# Patient Record
Sex: Female | Born: 1965 | Race: White | Hispanic: No | Marital: Married | Smoking: Never smoker
Health system: Southern US, Community
[De-identification: ages and names within clinical notes are randomized; demographics above are authoritative.]

## PROBLEM LIST (undated history)

## (undated) DIAGNOSIS — U071 COVID-19: Secondary | ICD-10-CM

## (undated) DIAGNOSIS — T4145XA Adverse effect of unspecified anesthetic, initial encounter: Secondary | ICD-10-CM

## (undated) DIAGNOSIS — T8859XA Other complications of anesthesia, initial encounter: Secondary | ICD-10-CM

## (undated) DIAGNOSIS — J1282 Pneumonia due to coronavirus disease 2019: Secondary | ICD-10-CM

## (undated) DIAGNOSIS — G43909 Migraine, unspecified, not intractable, without status migrainosus: Secondary | ICD-10-CM

## (undated) DIAGNOSIS — J45909 Unspecified asthma, uncomplicated: Secondary | ICD-10-CM

## (undated) DIAGNOSIS — N393 Stress incontinence (female) (male): Secondary | ICD-10-CM

## (undated) DIAGNOSIS — E119 Type 2 diabetes mellitus without complications: Secondary | ICD-10-CM

## (undated) DIAGNOSIS — D219 Benign neoplasm of connective and other soft tissue, unspecified: Secondary | ICD-10-CM

## (undated) DIAGNOSIS — Z87442 Personal history of urinary calculi: Secondary | ICD-10-CM

## (undated) DIAGNOSIS — R350 Frequency of micturition: Secondary | ICD-10-CM

## (undated) DIAGNOSIS — Z8489 Family history of other specified conditions: Secondary | ICD-10-CM

## (undated) DIAGNOSIS — R0600 Dyspnea, unspecified: Secondary | ICD-10-CM

## (undated) DIAGNOSIS — N200 Calculus of kidney: Secondary | ICD-10-CM

## (undated) HISTORY — PX: OTHER SURGICAL HISTORY: SHX169

## (undated) HISTORY — PX: EYE SURGERY: SHX253

## (undated) HISTORY — PX: KIDNEY STONE SURGERY: SHX686

## (undated) HISTORY — PX: WISDOM TOOTH EXTRACTION: SHX21

---

## 1998-08-04 ENCOUNTER — Inpatient Hospital Stay (HOSPITAL_COMMUNITY): Admission: AD | Admit: 1998-08-04 | Discharge: 1998-08-06 | Payer: Self-pay | Admitting: Obstetrics and Gynecology

## 1998-09-06 ENCOUNTER — Inpatient Hospital Stay (HOSPITAL_COMMUNITY): Admission: AD | Admit: 1998-09-06 | Discharge: 1998-09-08 | Payer: Self-pay | Admitting: Obstetrics and Gynecology

## 1998-10-04 ENCOUNTER — Other Ambulatory Visit: Admission: RE | Admit: 1998-10-04 | Discharge: 1998-10-04 | Payer: Self-pay | Admitting: Obstetrics and Gynecology

## 2000-09-06 ENCOUNTER — Other Ambulatory Visit: Admission: RE | Admit: 2000-09-06 | Discharge: 2000-09-06 | Payer: Self-pay | Admitting: Obstetrics and Gynecology

## 2001-03-31 ENCOUNTER — Inpatient Hospital Stay (HOSPITAL_COMMUNITY): Admission: AD | Admit: 2001-03-31 | Discharge: 2001-04-02 | Payer: Self-pay | Admitting: Obstetrics and Gynecology

## 2001-05-04 ENCOUNTER — Other Ambulatory Visit: Admission: RE | Admit: 2001-05-04 | Discharge: 2001-05-04 | Payer: Self-pay | Admitting: Obstetrics and Gynecology

## 2002-07-20 ENCOUNTER — Other Ambulatory Visit: Admission: RE | Admit: 2002-07-20 | Discharge: 2002-07-20 | Payer: Self-pay | Admitting: Obstetrics and Gynecology

## 2003-08-17 ENCOUNTER — Other Ambulatory Visit: Admission: RE | Admit: 2003-08-17 | Discharge: 2003-08-17 | Payer: Self-pay | Admitting: Obstetrics and Gynecology

## 2004-01-04 ENCOUNTER — Observation Stay (HOSPITAL_COMMUNITY): Admission: RE | Admit: 2004-01-04 | Discharge: 2004-01-04 | Payer: Self-pay | Admitting: Obstetrics and Gynecology

## 2004-01-04 HISTORY — PX: HYSTEROSCOPY WITH RESECTOSCOPE: SHX5395

## 2004-04-25 ENCOUNTER — Ambulatory Visit (HOSPITAL_COMMUNITY): Admission: RE | Admit: 2004-04-25 | Discharge: 2004-04-25 | Payer: Self-pay | Admitting: Obstetrics and Gynecology

## 2004-04-25 HISTORY — PX: ENDOMETRIAL ABLATION W/ NOVASURE: SUR434

## 2012-09-21 ENCOUNTER — Observation Stay (HOSPITAL_BASED_OUTPATIENT_CLINIC_OR_DEPARTMENT_OTHER)
Admission: EM | Admit: 2012-09-21 | Discharge: 2012-09-22 | Disposition: A | Discharge status: Home / Self Care | Payer: Managed Care, Other (non HMO) | Attending: Urology | Admitting: Urology

## 2012-09-21 ENCOUNTER — Emergency Department (HOSPITAL_BASED_OUTPATIENT_CLINIC_OR_DEPARTMENT_OTHER): Payer: Managed Care, Other (non HMO)

## 2012-09-21 ENCOUNTER — Encounter (HOSPITAL_BASED_OUTPATIENT_CLINIC_OR_DEPARTMENT_OTHER): Payer: Self-pay | Admitting: *Deleted

## 2012-09-21 DIAGNOSIS — N23 Unspecified renal colic: Secondary | ICD-10-CM

## 2012-09-21 DIAGNOSIS — Z23 Encounter for immunization: Secondary | ICD-10-CM | POA: Insufficient documentation

## 2012-09-21 DIAGNOSIS — N201 Calculus of ureter: Principal | ICD-10-CM | POA: Insufficient documentation

## 2012-09-21 HISTORY — DX: Benign neoplasm of connective and other soft tissue, unspecified: D21.9

## 2012-09-21 HISTORY — DX: Migraine, unspecified, not intractable, without status migrainosus: G43.909

## 2012-09-21 LAB — URINALYSIS, ROUTINE W REFLEX MICROSCOPIC
Glucose, UA: NEGATIVE mg/dL
Ketones, ur: NEGATIVE mg/dL
Nitrite: NEGATIVE
pH: 5 (ref 5.0–8.0)

## 2012-09-21 LAB — CBC WITH DIFFERENTIAL/PLATELET
Basophils Relative: 0 % (ref 0–1)
Eosinophils Absolute: 0.2 10*3/uL (ref 0.0–0.7)
Lymphs Abs: 2.3 10*3/uL (ref 0.7–4.0)
MCH: 29.8 pg (ref 26.0–34.0)
MCHC: 34 g/dL (ref 30.0–36.0)
Neutro Abs: 14.2 10*3/uL — ABNORMAL HIGH (ref 1.7–7.7)
Neutrophils Relative %: 80 % — ABNORMAL HIGH (ref 43–77)
Platelets: 277 10*3/uL (ref 150–400)
RBC: 4.53 MIL/uL (ref 3.87–5.11)

## 2012-09-21 LAB — PREGNANCY, URINE: Preg Test, Ur: NEGATIVE

## 2012-09-21 LAB — URINE MICROSCOPIC-ADD ON

## 2012-09-21 LAB — BASIC METABOLIC PANEL
Chloride: 102 mEq/L (ref 96–112)
GFR calc Af Amer: 56 mL/min — ABNORMAL LOW (ref >90)
GFR calc non Af Amer: 48 mL/min — ABNORMAL LOW (ref >90)
Potassium: 4.2 mEq/L (ref 3.5–5.1)
Sodium: 138 mEq/L (ref 135–145)

## 2012-09-21 MED ORDER — CIPROFLOXACIN IN D5W 400 MG/200ML IV SOLN
400.0000 mg | Freq: Two times a day (BID) | INTRAVENOUS | Status: DC
  Administered 2012-09-21 – 2012-09-22 (×2): 400 mg via INTRAVENOUS
  Filled 2012-09-21 (×3): qty 200

## 2012-09-21 MED ORDER — CEPHALEXIN 250 MG PO CAPS
500.0000 mg | ORAL_CAPSULE | Freq: Once | ORAL | Status: AC
  Administered 2012-09-21: 500 mg via ORAL
  Filled 2012-09-21: qty 2

## 2012-09-21 MED ORDER — ONDANSETRON HCL 4 MG/2ML IJ SOLN
4.0000 mg | Freq: Once | INTRAMUSCULAR | Status: AC
  Administered 2012-09-21: 4 mg via INTRAVENOUS
  Filled 2012-09-21: qty 2

## 2012-09-21 MED ORDER — ONDANSETRON 8 MG PO TBDP: 8.0000 mg | ORAL_TABLET | Freq: Three times a day (TID) | ORAL | Status: DC | PRN

## 2012-09-21 MED ORDER — PNEUMOCOCCAL VAC POLYVALENT 25 MCG/0.5ML IJ INJ
0.5000 mL | INJECTION | INTRAMUSCULAR | Status: AC
  Administered 2012-09-22: 0.5 mL via INTRAMUSCULAR
  Filled 2012-09-21: qty 0.5

## 2012-09-21 MED ORDER — INFLUENZA VIRUS VACC SPLIT PF IM SUSP
0.5000 mL | INTRAMUSCULAR | Status: AC
  Administered 2012-09-22: 0.5 mL via INTRAMUSCULAR
  Filled 2012-09-21: qty 0.5

## 2012-09-21 MED ORDER — HYDROMORPHONE HCL PF 1 MG/ML IJ SOLN
0.5000 mg | INTRAMUSCULAR | Status: DC | PRN
  Administered 2012-09-21 – 2012-09-22 (×6): 1 mg via INTRAVENOUS
  Filled 2012-09-21 (×6): qty 1

## 2012-09-21 MED ORDER — CEPHALEXIN 500 MG PO CAPS: 500.0000 mg | ORAL_CAPSULE | Freq: Four times a day (QID) | ORAL | Status: DC

## 2012-09-21 MED ORDER — ONDANSETRON 8 MG PO TBDP
8.0000 mg | ORAL_TABLET | Freq: Once | ORAL | Status: AC
  Administered 2012-09-21: 8 mg via ORAL
  Filled 2012-09-21: qty 1

## 2012-09-21 MED ORDER — ONDANSETRON HCL 4 MG/2ML IJ SOLN
4.0000 mg | INTRAMUSCULAR | Status: DC | PRN
  Administered 2012-09-21 – 2012-09-22 (×2): 4 mg via INTRAVENOUS
  Filled 2012-09-21 (×2): qty 2

## 2012-09-21 MED ORDER — HYDROMORPHONE HCL PF 1 MG/ML IJ SOLN
INTRAMUSCULAR | Status: AC
  Administered 2012-09-21: 1 mg
  Filled 2012-09-21: qty 1

## 2012-09-21 MED ORDER — OXYCODONE-ACETAMINOPHEN 5-325 MG PO TABS: 1.0000 | ORAL_TABLET | ORAL | Status: AC | PRN

## 2012-09-21 MED ORDER — SODIUM CHLORIDE 0.9 % IV SOLN
Freq: Once | INTRAVENOUS | Status: AC
  Administered 2012-09-21: 10:00:00 via INTRAVENOUS

## 2012-09-21 MED ORDER — KETOROLAC TROMETHAMINE 30 MG/ML IJ SOLN
30.0000 mg | Freq: Once | INTRAMUSCULAR | Status: AC
  Administered 2012-09-21: 30 mg via INTRAVENOUS
  Filled 2012-09-21: qty 1

## 2012-09-21 MED ORDER — HYDROMORPHONE HCL PF 1 MG/ML IJ SOLN
1.0000 mg | Freq: Once | INTRAMUSCULAR | Status: AC
  Administered 2012-09-21: 1 mg via INTRAVENOUS
  Filled 2012-09-21: qty 1

## 2012-09-21 MED ORDER — POTASSIUM CHLORIDE IN NACL 20-0.45 MEQ/L-% IV SOLN
INTRAVENOUS | Status: DC
  Administered 2012-09-21 – 2012-09-22 (×2): via INTRAVENOUS
  Filled 2012-09-21 (×6): qty 1000

## 2012-09-21 MED ORDER — PROMETHAZINE HCL 25 MG/ML IJ SOLN
25.0000 mg | Freq: Once | INTRAMUSCULAR | Status: AC
  Administered 2012-09-21: 25 mg via INTRAVENOUS
  Filled 2012-09-21: qty 1

## 2012-09-21 MED ORDER — SODIUM CHLORIDE 0.9 % IV SOLN: Freq: Once | INTRAVENOUS | Status: AC

## 2012-09-21 MED ORDER — OXYCODONE-ACETAMINOPHEN 5-325 MG PO TABS
2.0000 | ORAL_TABLET | Freq: Once | ORAL | Status: AC
  Administered 2012-09-21: 2 via ORAL
  Filled 2012-09-21 (×2): qty 2

## 2012-09-21 NOTE — ED Notes (Signed)
Patient states she developed left lower back pain and left lower abdominal pain yesterday.  Over the last hour the pain has intensified and is associated nausea and cold sweats.  Noticed brownish blood x 1 in urine yesterday.

## 2012-09-21 NOTE — ED Notes (Signed)
ZOX:WRUE<AV> Expected date:09/21/12<BR> Expected time: 5:48 PM<BR> Means of arrival:Ambulance<BR> Comments:<BR> 33yoM, SSC

## 2012-09-21 NOTE — Consult Note (Signed)
Urology Consult  Referring physician: Renal colic Reason for referral: Renal colic  Chief Complaint: left flank pain   History of Present Illness: left flank pain 2  Days; nausea but no fever; pain refractory to pain medications and zofran Denies previous surgery/stone/UTI Mild incontinence and no frequency Modifying factors: There are no other modifying factors  Associated signs and symptoms: There are no other associated signs and symptoms Aggravating and relieving factors: There are no other aggravating or relieving factors Severity: Moderate-mild Duration: Persistent    Past Medical History  Diagnosis Date  . Migraine   . Fibroids    Past Surgical History  Procedure Date  . Uterine ablation     Medications: I have reviewed the patient's current medications. Allergies: No Known Allergies  No family history on file. Social History:  reports that she has never smoked. She does not have any smokeless tobacco history on file. She reports that she does not drink alcohol or use illicit drugs.  ROS: All systems are reviewed and negative except as noted ROS negative rest  Physical Exam:  Vital signs in last 24 hours: Temp:  [97.7 F (36.5 C)-98.4 F (36.9 C)] 97.9 F (36.6 C) (10/02 1555) Pulse Rate:  [60-74] 66  (10/02 1800) Resp:  [16-20] 20  (10/02 1555) BP: (139-146)/(83-95) 146/95 mmHg (10/02 1800) SpO2:  [95 %-100 %] 95 % (10/02 1800) Weight:  [90.719 kg (200 lb)] 90.719 kg (200 lb) (10/02 0955)  Cardiovascular: Skin warm; not flushed Respiratory: Breaths quiet; no shortness of breath Abdomen: No masses Neurological: Normal sensation to touch Musculoskeletal: Normal motor function arms and legs Lymphatics: No inguinal adenopathy Skin: No rashes Genitourinary:looks uncomfortable/ not toxic/no CVA or abdominal tenderness  Laboratory Data:  Results for orders placed during the hospital encounter of 09/21/12 (from the past 72 hour(s))  CBC WITH DIFFERENTIAL      Status: Abnormal   Collection Time   09/21/12 10:20 AM      Component Value Range Comment   WBC 17.9 (*) 4.0 - 10.5 K/uL    RBC 4.53  3.87 - 5.11 MIL/uL    Hemoglobin 13.5  12.0 - 15.0 g/dL    HCT 21.3  08.6 - 57.8 %    MCV 87.6  78.0 - 100.0 fL    MCH 29.8  26.0 - 34.0 pg    MCHC 34.0  30.0 - 36.0 g/dL    RDW 46.9  62.9 - 52.8 %    Platelets 277  150 - 400 K/uL    Neutrophils Relative 80 (*) 43 - 77 %    Neutro Abs 14.2 (*) 1.7 - 7.7 K/uL    Lymphocytes Relative 13  12 - 46 %    Lymphs Abs 2.3  0.7 - 4.0 K/uL    Monocytes Relative 6  3 - 12 %    Monocytes Absolute 1.1 (*) 0.1 - 1.0 K/uL    Eosinophils Relative 1  0 - 5 %    Eosinophils Absolute 0.2  0.0 - 0.7 K/uL    Basophils Relative 0  0 - 1 %    Basophils Absolute 0.0  0.0 - 0.1 K/uL   BASIC METABOLIC PANEL     Status: Abnormal   Collection Time   09/21/12 10:22 AM      Component Value Range Comment   Sodium 138  135 - 145 mEq/L    Potassium 4.2  3.5 - 5.1 mEq/L    Chloride 102  96 - 112 mEq/L  CO2 22  19 - 32 mEq/L    Glucose, Bld 136 (*) 70 - 99 mg/dL    BUN 19  6 - 23 mg/dL    Creatinine, Ser 3.08 (*) 0.50 - 1.10 mg/dL    Calcium 9.8  8.4 - 65.7 mg/dL    GFR calc non Af Amer 48 (*) >90 mL/min    GFR calc Af Amer 56 (*) >90 mL/min   URINALYSIS, ROUTINE W REFLEX MICROSCOPIC     Status: Abnormal   Collection Time   09/21/12 11:35 AM      Component Value Range Comment   Color, Urine YELLOW  YELLOW    APPearance CLEAR  CLEAR    Specific Gravity, Urine 1.018  1.005 - 1.030    pH 5.0  5.0 - 8.0    Glucose, UA NEGATIVE  NEGATIVE mg/dL    Hgb urine dipstick MODERATE (*) NEGATIVE    Bilirubin Urine NEGATIVE  NEGATIVE    Ketones, ur NEGATIVE  NEGATIVE mg/dL    Protein, ur NEGATIVE  NEGATIVE mg/dL    Urobilinogen, UA 0.2  0.0 - 1.0 mg/dL    Nitrite NEGATIVE  NEGATIVE    Leukocytes, UA SMALL (*) NEGATIVE   PREGNANCY, URINE     Status: Normal   Collection Time   09/21/12 11:35 AM      Component Value Range Comment    Preg Test, Ur NEGATIVE  NEGATIVE   URINE MICROSCOPIC-ADD ON     Status: Abnormal   Collection Time   09/21/12 11:35 AM      Component Value Range Comment   Squamous Epithelial / LPF RARE  RARE    WBC, UA 3-6  <3 WBC/hpf    RBC / HPF 0-2  <3 RBC/hpf    Bacteria, UA RARE  RARE    Crystals URIC ACID CRYSTALS (*) NEGATIVE    Urine-Other MUCOUS PRESENT      No results found for this or any previous visit (from the past 240 hour(s)). Creatinine:  Basename 09/21/12 1022  CREATININE 1.30*    Xrays: See report/chart Reviewed CT can  Impression/Assessment:  1.2 cm UPJx stone with obstruction and non-obst stone on left   Plan:  Drew picture to patient and family Recommend Stent and subsequent ESWL Pros and cons and risks and sequelae and issues with stents decribed Patient would rather proceed and not go home OR booked with multiple cases Eat now- keep NPO- stent tomorrow at 11 am or earlier- pt agress  Carizma Dunsworth A 09/21/2012, 6:15 PM

## 2012-09-21 NOTE — ED Provider Notes (Signed)
Pt already seen primarily by Urology in [facility] ED for OR after transferred by [facility] ED. Pt not seen by EDP in [facility] ED.  Hurman Horn, MD 09/21/12 [phone]

## 2012-09-21 NOTE — ED Provider Notes (Addendum)
History     CSN: [id number]  Arrival date & time 09/21/12  4098   First MD Initiated Contact with Patient 09/21/12 1018      Chief Complaint  Patient presents with  . Flank Pain    left    (Consider location/radiation/quality/duration/timing/severity/associated sxs/prior treatment) HPI  Show with left flank pain for 1-1/2 days. She has had some nausea with this. The pain waxes and wanes. It is severe at times. She is unable to sit still at times when it is worse. She noticed dark urine yesterday. She has not had increased frequency of urination. She has not had any fever or chills.  Past Medical History  Diagnosis Date  . Migraine   . Fibroids     Past Surgical History  Procedure Date  . Uterine ablation     No family history on file.  History  Substance Use Topics  . Smoking status: Never Smoker   . Smokeless tobacco: Not on file  . Alcohol Use: No    OB History    Grav Para Term Preterm Abortions TAB SAB Ect Mult Living                  Review of Systems  Constitutional: Negative for fever, chills, activity change, appetite change and unexpected weight change.  HENT: Negative for sore throat, rhinorrhea, neck pain, neck stiffness and sinus pressure.   Eyes: Negative for visual disturbance.  Respiratory: Negative for cough and shortness of breath.   Cardiovascular: Negative for chest pain and leg swelling.  Gastrointestinal: Negative for vomiting, abdominal pain, diarrhea and blood in stool.  Genitourinary: Negative for dysuria, urgency, frequency, vaginal discharge and difficulty urinating.  Musculoskeletal: Negative for myalgias, arthralgias and gait problem.  Skin: Negative for color change and rash.  Neurological: Negative for weakness, light-headedness and headaches.  Hematological: Does not bruise/bleed easily.  Psychiatric/Behavioral: Negative for dysphoric mood.    Allergies  Review of patient's allergies indicates no known allergies.  Home  Medications  No current outpatient prescriptions on file.  BP 139/84  Pulse 66  Temp 98.4 F (36.9 C) (Oral)  Resp 20  Ht 5\' 6"  (1.676 m)  Wt 200 lb (90.719 kg)  BMI 32.28 kg/m2  SpO2 100%  LMP 09/14/2012  Physical Exam  Nursing note and vitals reviewed. Constitutional: She appears well-developed and well-nourished.  HENT:  Head: Normocephalic and atraumatic.  Eyes: Conjunctivae normal and EOM are normal. Pupils are equal, round, and reactive to light.  Neck: Normal range of motion. Neck supple.  Cardiovascular: Normal rate, regular rhythm, normal heart sounds and intact distal pulses.   Pulmonary/Chest: Effort normal and breath sounds normal.  Abdominal: Soft. Bowel sounds are normal.  Musculoskeletal: Normal range of motion.  Neurological: She is alert.  Skin: Skin is warm and dry.  Psychiatric: She has a normal mood and affect. Thought content normal.    ED Course  Procedures (including critical care time)  Labs Reviewed  URINALYSIS, ROUTINE W REFLEX MICROSCOPIC - Abnormal; Notable for the following:    Hgb urine dipstick MODERATE (*)     Leukocytes, UA SMALL (*)     All other components within normal limits  CBC WITH DIFFERENTIAL - Abnormal; Notable for the following:    WBC 17.9 (*)     Neutrophils Relative 80 (*)     Neutro Abs 14.2 (*)     Monocytes Absolute 1.1 (*)     All other components within normal limits  BASIC METABOLIC PANEL -  Abnormal; Notable for the following:    Glucose, Bld 136 (*)     Creatinine, Ser 1.30 (*)     GFR calc non Af Amer 48 (*)     GFR calc Af Amer 56 (*)     All other components within normal limits  URINE MICROSCOPIC-ADD ON - Abnormal; Notable for the following:    Crystals URIC ACID CRYSTALS (*)     All other components within normal limits  PREGNANCY, URINE  URINE CULTURE   Ct Abdomen Pelvis Wo Contrast  09/21/2012  *RADIOLOGY REPORT*  Clinical Data: Left flank pain extending into left lower abdomen. Hematuria.  CT  ABDOMEN AND PELVIS WITHOUT CONTRAST  Technique:  Multidetector CT imaging of the abdomen and pelvis was performed following the standard protocol without intravenous contrast.  Comparison: None.  Findings: The lungs are clear.  The heart size is normal.  No significant pleural or pericardial effusion is evident.  There is diffuse fatty infiltration of the liver.  No focal areas of discrete hypoattenuation are seen anteriorly in the lower right lobe on images 33 and 36.  These are indeterminate lesions.  The spleen is within normal limits.  The stomach, duodenum, and pancreas are within normal limits as well.  The common bile duct and gallbladder are normal.  A low density exophytic lesion of the right kidney measures 2.8 cm.  The right kidney is otherwise unremarkable.  Moderate left-sided hydronephrosis is secondary to an obstructing 12 mm UPJ stone.  Additional nonobstructing lower pole left kidney stone measures 9 mm.  There is some stranding about the left kidney.  The distal ureter is within normal limits. Urinary bladder is collapsed.  The rectosigmoid colon is within normal limits.  The remainder of the colon is within normal limits.  The appendix is visualized and normal.  The small bowel is unremarkable.  The uterus and adnexa are within normal limits for age.  The bone windows demonstrate mild endplate degenerative changes in the lower thoracic spine.  No focal lytic or blastic lesions are evident.  IMPRESSION:  1.  Obstructing 12 mm left UPJ stone. 2.  This second 9 mm nonobstructing left lower pole stone is evident. 3.  No significant right-sided nephrolithiasis. 4.  Indeterminate 2.8 cm exophytic lesion of the right kidney. This likely represents a simple cyst. 5.  Diffuse fatty infiltration of the liver. 6.  Indeterminate 12 mm lesions along the inferior aspect of the right lobe of the liver.  These appear benign.  Both the hepatic and right kidney lesions could be further evaluated with abdominal MRI.    Non-emergent MRI should be deferred until patient has been discharged for the acute illness, and can optimally cooperate with positioning and breath-holding instructions.   Original Report Authenticated By: Jamesetta Orleans. MATTERN, [name]      No diagnosis found.    MDM  Patient given 1 L normal saline, Toradol 30 mg, Zofran 4 mg, and Dilaudid 1 mg IV with pain at 4/10. Patient states pain is so severe. I plan to consult urology given the size of the kidney stone and the obstructing nature. Patient's care discussed with Dr. McDiarmid.  Patient is continuing to have pain at 4/10 mm but will be treated with more iv pain medicine and reassessed.    Patient continues to have her pain controlled after oral Percocet. She is advised to call Dr. McDiarmid for followup in the next few days.  Hilario Quarry, MD 09/21/12 1423  Patient has  had worsening pain and given iv dilaudid repeat dosing without control of pain.  Plan transfer to [facility] ED for evaluation by Dr. Perley Jain.  Discussed with Dr Fonnie Jarvis who accepts the patient in transfer.   Hilario Quarry, MD 09/21/12 1515

## 2012-09-21 NOTE — ED Notes (Signed)
MD at bedside. Urology at bedside. 

## 2012-09-21 NOTE — ED Notes (Signed)
Dr. Jacquelyne Balint paged by [place].

## 2012-09-22 ENCOUNTER — Encounter (HOSPITAL_COMMUNITY): Payer: Self-pay | Admitting: Anesthesiology

## 2012-09-22 ENCOUNTER — Observation Stay (HOSPITAL_COMMUNITY): Payer: Managed Care, Other (non HMO) | Admitting: Anesthesiology

## 2012-09-22 ENCOUNTER — Encounter (HOSPITAL_COMMUNITY)
Admission: EM | Disposition: A | Discharge status: Home / Self Care | Payer: Self-pay | Source: Home / Self Care | Attending: Emergency Medicine

## 2012-09-22 HISTORY — PX: CYSTOSCOPY W/ URETERAL STENT PLACEMENT: SHX1429

## 2012-09-22 SURGERY — CYSTOSCOPY, WITH RETROGRADE PYELOGRAM AND URETERAL STENT INSERTION
Anesthesia: General | Laterality: Left | Wound class: Clean Contaminated

## 2012-09-22 MED ORDER — ACETAMINOPHEN 10 MG/ML IV SOLN
INTRAVENOUS | Status: AC
  Filled 2012-09-22: qty 100

## 2012-09-22 MED ORDER — LIDOCAINE HCL (CARDIAC) 20 MG/ML IV SOLN
INTRAVENOUS | Status: DC | PRN
  Administered 2012-09-22: 50 mg via INTRAVENOUS

## 2012-09-22 MED ORDER — CIPROFLOXACIN IN D5W 400 MG/200ML IV SOLN
INTRAVENOUS | Status: DC | PRN
  Administered 2012-09-22: 400 mg via INTRAVENOUS

## 2012-09-22 MED ORDER — HYDROMORPHONE HCL PF 1 MG/ML IJ SOLN: 0.2500 mg | INTRAMUSCULAR | Status: DC | PRN

## 2012-09-22 MED ORDER — ACETAMINOPHEN 10 MG/ML IV SOLN
INTRAVENOUS | Status: DC | PRN
  Administered 2012-09-22: 1000 mg via INTRAVENOUS

## 2012-09-22 MED ORDER — ACETAMINOPHEN 10 MG/ML IV SOLN: 1000.0000 mg | Freq: Once | INTRAVENOUS | Status: DC | PRN

## 2012-09-22 MED ORDER — ONDANSETRON HCL 4 MG/2ML IJ SOLN
INTRAMUSCULAR | Status: DC | PRN
  Administered 2012-09-22: 4 mg via INTRAVENOUS

## 2012-09-22 MED ORDER — IOHEXOL 300 MG/ML  SOLN
INTRAMUSCULAR | Status: AC
  Filled 2012-09-22: qty 1

## 2012-09-22 MED ORDER — MEPERIDINE HCL 50 MG/ML IJ SOLN: 6.2500 mg | INTRAMUSCULAR | Status: DC | PRN

## 2012-09-22 MED ORDER — PROMETHAZINE HCL 25 MG/ML IJ SOLN: 6.2500 mg | INTRAMUSCULAR | Status: DC | PRN

## 2012-09-22 MED ORDER — OXYCODONE HCL 5 MG PO TABS: 5.0000 mg | ORAL_TABLET | Freq: Once | ORAL | Status: DC | PRN

## 2012-09-22 MED ORDER — HYDROMORPHONE HCL 2 MG PO TABS: 2.0000 mg | ORAL_TABLET | Freq: Four times a day (QID) | ORAL | Status: DC | PRN

## 2012-09-22 MED ORDER — CIPROFLOXACIN HCL 250 MG PO TABS: 250.0000 mg | ORAL_TABLET | Freq: Two times a day (BID) | ORAL | Status: DC

## 2012-09-22 MED ORDER — LACTATED RINGERS IV SOLN
INTRAVENOUS | Status: DC
  Administered 2012-09-22: 1000 mL via INTRAVENOUS

## 2012-09-22 MED ORDER — IOHEXOL 300 MG/ML  SOLN
INTRAMUSCULAR | Status: DC | PRN
  Administered 2012-09-22: 10 mL via INTRAVENOUS

## 2012-09-22 MED ORDER — CIPROFLOXACIN IN D5W 400 MG/200ML IV SOLN
INTRAVENOUS | Status: AC
  Filled 2012-09-22: qty 200

## 2012-09-22 MED ORDER — HYDROCODONE-ACETAMINOPHEN 5-500 MG PO TABS: 1.0000 | ORAL_TABLET | Freq: Four times a day (QID) | ORAL | Status: DC | PRN

## 2012-09-22 MED ORDER — OXYCODONE HCL 5 MG/5ML PO SOLN
5.0000 mg | Freq: Once | ORAL | Status: DC | PRN
  Filled 2012-09-22: qty 5

## 2012-09-22 MED ORDER — STERILE WATER FOR IRRIGATION IR SOLN
Status: DC | PRN
  Administered 2012-09-22: 1000 mL

## 2012-09-22 MED ORDER — DEXAMETHASONE SODIUM PHOSPHATE 10 MG/ML IJ SOLN
INTRAMUSCULAR | Status: DC | PRN
  Administered 2012-09-22: 10 mg via INTRAVENOUS

## 2012-09-22 MED ORDER — FENTANYL CITRATE 0.05 MG/ML IJ SOLN
INTRAMUSCULAR | Status: DC | PRN
  Administered 2012-09-22: 25 ug via INTRAVENOUS

## 2012-09-22 MED ORDER — PROPOFOL 10 MG/ML IV BOLUS
INTRAVENOUS | Status: DC | PRN
  Administered 2012-09-22: 200 mg via INTRAVENOUS

## 2012-09-22 SURGICAL SUPPLY — 14 items
ADAPTER CATH URET PLST 4-6FR (CATHETERS) IMPLANT
BAG URO CATCHER STRL LF (DRAPE) ×2 IMPLANT
CATH INTERMIT  6FR 70CM (CATHETERS) ×2 IMPLANT
CLOTH BEACON ORANGE TIMEOUT ST (SAFETY) ×2 IMPLANT
DRAPE CAMERA CLOSED 9X96 (DRAPES) ×2 IMPLANT
GLOVE BIOGEL M STRL SZ7.5 (GLOVE) ×2 IMPLANT
GOWN PREVENTION PLUS XLARGE (GOWN DISPOSABLE) ×2 IMPLANT
GOWN STRL REIN XL XLG (GOWN DISPOSABLE) ×2 IMPLANT
GUIDEWIRE STR DUAL SENSOR (WIRE) ×2 IMPLANT
MANIFOLD NEPTUNE II (INSTRUMENTS) ×2 IMPLANT
MARKER SKIN DUAL TIP RULER LAB (MISCELLANEOUS) ×2 IMPLANT
PACK CYSTO (CUSTOM PROCEDURE TRAY) ×2 IMPLANT
STENT CONTOUR 6FRX26X.038 (STENTS) ×2 IMPLANT
TUBING CONNECTING 10 (TUBING) ×2 IMPLANT

## 2012-09-22 NOTE — H&P (Signed)
Referring physician: Renal colic  Reason for referral: Renal colic  Chief Complaint: left flank pain  History of Present Illness: left flank pain 2 Days; nausea but no fever; pain refractory to pain medications and zofran  Denies previous surgery/stone/UTI  Mild incontinence and no frequency  Modifying factors: There are no other modifying factors  Associated signs and symptoms: There are no other associated signs and symptoms  Aggravating and relieving factors: There are no other aggravating or relieving factors  Severity: Moderate-mild  Duration: Persistent  Past Medical History   Diagnosis  Date   .  Migraine    .  Fibroids     Past Surgical History   Procedure  Date   .  Uterine ablation     Medications: I have reviewed the patient's current medications.  Allergies: No Known Allergies  No family history on file.  Social History: reports that she has never smoked. She does not have any smokeless tobacco history on file. She reports that she does not drink alcohol or use illicit drugs.  ROS: All systems are reviewed and negative except as noted ROS negative rest  Physical Exam:  Vital signs in last 24 hours:  Temp: [97.7 F (36.5 C)-98.4 F (36.9 C)] 97.9 F (36.6 C) (10/02 1555)  Pulse Rate: [60-74] 66 (10/02 1800)  Resp: [16-20] 20 (10/02 1555)  BP: (139-146)/(83-95) 146/95 mmHg (10/02 1800)  SpO2: [95 %-100 %] 95 % (10/02 1800)  Weight: [90.719 kg (200 lb)] 90.719 kg (200 lb) (10/02 0955)  Cardiovascular: Skin warm; not flushed  Respiratory: Breaths quiet; no shortness of breath  Abdomen: No masses  Neurological: Normal sensation to touch  Musculoskeletal: Normal motor function arms and legs  Lymphatics: No inguinal adenopathy  Skin: No rashes  Genitourinary:looks uncomfortable/ not toxic/no CVA or abdominal tenderness  Laboratory Data:  Results for orders placed during the hospital encounter of 09/21/12 (from the past 72 hour(s))   CBC WITH DIFFERENTIAL Status:  Abnormal    Collection Time    09/21/12 10:20 AM   Component  Value  Range  Comment    WBC  17.9 (*)  4.0 - 10.5 K/uL     RBC  4.53  3.87 - 5.11 MIL/uL     Hemoglobin  13.5  12.0 - 15.0 g/dL     HCT  78.2  95.6 - 21.3 %     MCV  87.6  78.0 - 100.0 fL     MCH  29.8  26.0 - 34.0 pg     MCHC  34.0  30.0 - 36.0 g/dL     RDW  08.6  57.8 - 46.9 %     Platelets  277  150 - 400 K/uL     Neutrophils Relative  80 (*)  43 - 77 %     Neutro Abs  14.2 (*)  1.7 - 7.7 K/uL     Lymphocytes Relative  13  12 - 46 %     Lymphs Abs  2.3  0.7 - 4.0 K/uL     Monocytes Relative  6  3 - 12 %     Monocytes Absolute  1.1 (*)  0.1 - 1.0 K/uL     Eosinophils Relative  1  0 - 5 %     Eosinophils Absolute  0.2  0.0 - 0.7 K/uL     Basophils Relative  0  0 - 1 %     Basophils Absolute  0.0  0.0 - 0.1 K/uL  BASIC METABOLIC PANEL Status: Abnormal    Collection Time    09/21/12 10:22 AM   Component  Value  Range  Comment    Sodium  138  135 - 145 mEq/L     Potassium  4.2  3.5 - 5.1 mEq/L     Chloride  102  96 - 112 mEq/L     CO2  22  19 - 32 mEq/L     Glucose, Bld  136 (*)  70 - 99 mg/dL     BUN  19  6 - 23 mg/dL     Creatinine, Ser  1.61 (*)  0.50 - 1.10 mg/dL     Calcium  9.8  8.4 - 10.5 mg/dL     GFR calc non Af Amer  48 (*)  >90 mL/min     GFR calc Af Amer  56 (*)  >90 mL/min    URINALYSIS, ROUTINE W REFLEX MICROSCOPIC Status: Abnormal    Collection Time    09/21/12 11:35 AM   Component  Value  Range  Comment    Color, Urine  YELLOW  YELLOW     APPearance  CLEAR  CLEAR     Specific Gravity, Urine  1.018  1.005 - 1.030     pH  5.0  5.0 - 8.0     Glucose, UA  NEGATIVE  NEGATIVE mg/dL     Hgb urine dipstick  MODERATE (*)  NEGATIVE     Bilirubin Urine  NEGATIVE  NEGATIVE     Ketones, ur  NEGATIVE  NEGATIVE mg/dL     Protein, ur  NEGATIVE  NEGATIVE mg/dL     Urobilinogen, UA  0.2  0.0 - 1.0 mg/dL     Nitrite  NEGATIVE  NEGATIVE     Leukocytes, UA  SMALL (*)  NEGATIVE    PREGNANCY, URINE Status:  Normal    Collection Time    09/21/12 11:35 AM   Component  Value  Range  Comment    Preg Test, Ur  NEGATIVE  NEGATIVE    URINE MICROSCOPIC-ADD ON Status: Abnormal    Collection Time    09/21/12 11:35 AM   Component  Value  Range  Comment    Squamous Epithelial / LPF  RARE  RARE     WBC, UA  3-6  <3 WBC/hpf     RBC / HPF  0-2  <3 RBC/hpf     Bacteria, UA  RARE  RARE     Crystals  URIC ACID CRYSTALS (*)  NEGATIVE     Urine-Other  MUCOUS PRESENT      No results found for this or any previous visit (from the past 240 hour(s)).  Creatinine:   Basename  09/21/12 1022   CREATININE  1.30*    Xrays:  See report/chart  Reviewed CT can  Impression/Assessment:  1.2 cm UPJx stone with obstruction and non-obst stone on left  Plan:  Drew picture to patient and family  Recommend Stent and subsequent ESWL  Pros and cons and risks and sequelae and issues with stents decribed  Patient would rather proceed and not go home  OR booked with multiple cases  Eat now- keep NPO- stent tomorrow at 11 am or earlier- pt agress  Randa Riss A  09/21/2012, 6:15 PM    After a thorough review of the management options for the patient's condition the patient  elected to proceed with surgical therapy as noted above. We have discussed the potential benefits and risks of  the procedure, side effects of the proposed treatment, the likelihood of the patient achieving the goals of the procedure, and any potential problems that might occur during the procedure or recuperation. Informed consent has been obtained.

## 2012-09-22 NOTE — Transfer of Care (Signed)
Immediate Anesthesia Transfer of Care Note  Patient: Whitney Shelton  Procedure(s) Performed: Procedure(s) (LRB): CYSTOSCOPY WITH RETROGRADE PYELOGRAM/URETERAL STENT PLACEMENT (Left)  Patient Location: PACU  Anesthesia Type: General  Level of Consciousness: sedated, patient cooperative and responds to stimulaton  Airway & Oxygen Therapy: Patient Spontanous Breathing and Patient connected to face mask oxgen  Post-op Assessment: Report given to PACU RN and Post -op Vital signs reviewed and stable  Post vital signs: Reviewed and stable  Complications: No apparent anesthesia complications

## 2012-09-22 NOTE — Discharge Summary (Signed)
Date of admission: 09/21/2012  Date of discharge: 09/22/2012  Admission diagnosis: lt ureteral stone  Discharge diagnosis: letf ureteral stone  Secondary diagnoses: none  History and Physical: For full details, please see admission history and physical. Briefly, ADALIZ DOBIS is a 46 y.o. year old patient with colic with stones.   Hospital Course: stent/no post op problems  Laboratory values:  Basename 09/21/12 1020  HGB 13.5  HCT 39.7    Basename 09/21/12 1022  CREATININE 1.30*    Disposition: Home  Discharge instruction: The patient was instructed to be ambulatory but told to refrain from heavy lifting, strenuous activity, or driving. described  Discharge medications:    Medication List     As of 09/22/2012  5:59 PM    START taking these medications         cephALEXin 500 MG capsule   Commonly known as: KEFLEX   Take 1 capsule (500 mg total) by mouth 4 (four) times daily.      ciprofloxacin 250 MG tablet   Commonly known as: CIPRO   Take 1 tablet (250 mg total) by mouth 2 (two) times daily.      HYDROcodone-acetaminophen 5-500 MG per tablet   Commonly known as: VICODIN   Take 1-2 tablets by mouth every 6 (six) hours as needed for pain.      HYDROmorphone 2 MG tablet   Commonly known as: DILAUDID   Take 1 tablet (2 mg total) by mouth every 6 (six) hours as needed for pain.      ondansetron 8 MG disintegrating tablet   Commonly known as: ZOFRAN-ODT   Take 1 tablet (8 mg total) by mouth every 8 (eight) hours as needed for nausea.      oxyCODONE-acetaminophen 5-325 MG per tablet   Commonly known as: PERCOCET/ROXICET   Take 1 tablet by mouth every 4 (four) hours as needed for pain.      CONTINUE taking these medications         ibuprofen 200 MG tablet   Commonly known as: ADVIL,MOTRIN          Where to get your medications    These are the prescriptions that you need to pick up.   You may get these medications from any pharmacy.         cephALEXin 500  MG capsule   ciprofloxacin 250 MG tablet   HYDROcodone-acetaminophen 5-500 MG per tablet   HYDROmorphone 2 MG tablet   ondansetron 8 MG disintegrating tablet   oxyCODONE-acetaminophen 5-325 MG per tablet            Followup:  Follow-up Information    Follow up with Collier Monica A, MD. (as scheduled i will call)    Contact information:   [address]LO[address] [facility] [place] [facility] [place] [address] [phone]

## 2012-09-22 NOTE — Interval H&P Note (Signed)
History and Physical Interval Note:  09/22/2012 2:42 PM  [name]  has presented today for surgery, with the diagnosis of left ureteral obstruction  The various methods of treatment have been discussed with the patient and family. After consideration of risks, benefits and other options for treatment, the patient has consented to  Procedure(s) (LRB) with comments: CYSTOSCOPY WITH RETROGRADE PYELOGRAM/URETERAL STENT PLACEMENT (Left) as a surgical intervention .  The patient's history has been reviewed, patient examined, no change in status, stable for surgery.  I have reviewed the patient's chart and labs.  Questions were answered to the patient's satisfaction.     Whitney Shelton A

## 2012-09-22 NOTE — Anesthesia Postprocedure Evaluation (Signed)
Anesthesia Post Note  Patient: Whitney Shelton  Procedure(s) Performed: Procedure(s) (LRB): CYSTOSCOPY WITH RETROGRADE PYELOGRAM/URETERAL STENT PLACEMENT (Left)  Anesthesia type: General  Patient location: PACU  Post pain: Pain level controlled  Post assessment: Post-op Vital signs reviewed  Last Vitals: BP 130/78  Pulse 65  Temp 36.8 C (Oral)  Resp 12  Ht 5\' 6"  (1.676 m)  Wt 200 lb (90.719 kg)  BMI 32.28 kg/m2  SpO2 100%  LMP 09/14/2012  Post vital signs: Reviewed  Level of consciousness: sedated  Complications: No apparent anesthesia complications

## 2012-09-22 NOTE — Anesthesia Preprocedure Evaluation (Addendum)
Anesthesia Evaluation  Patient identified by MRN, date of birth, ID band Patient awake    Reviewed: Allergy & Precautions, H&P , NPO status , Patient's Chart, lab work & pertinent test results  Airway Mallampati: II TM Distance: >3 FB Neck ROM: Full    Dental  (+) Teeth Intact and Dental Advisory Given,    Pulmonary neg pulmonary ROS,  breath sounds clear to auscultation        Cardiovascular - CAD, - Past MI and - CHF Rhythm:Regular Rate:Normal     Neuro/Psych  Headaches, negative psych ROS   GI/Hepatic negative GI ROS, Neg liver ROS,   Endo/Other  negative endocrine ROS  Renal/GU Renal Insufficiencynegative Renal ROS     Musculoskeletal negative musculoskeletal ROS (+)   Abdominal   Peds  Hematology negative hematology ROS (+)   Anesthesia Other Findings   Reproductive/Obstetrics                         Anesthesia Physical Anesthesia Plan  ASA: I  Anesthesia Plan: General   Post-op Pain Management:    Induction: Intravenous  Airway Management Planned: LMA  Additional Equipment:   Intra-op Plan:   Post-operative Plan: Extubation in OR  Informed Consent: I have reviewed the patients History and Physical, chart, labs and discussed the procedure including the risks, benefits and alternatives for the proposed anesthesia with the patient or authorized representative who has indicated his/her understanding and acceptance.   Dental advisory given  Plan Discussed with: CRNA  Anesthesia Plan Comments:         Anesthesia Quick Evaluation

## 2012-09-22 NOTE — Op Note (Signed)
Preoperative diagnosis: Left ureteral stone Postoperative diagnosis left ureteral stone Surgery: Cystoscopy left retrograde ureterogram and insertion of left ureteral stent Surgeon: Dr. Lorin Picket Ikhlas Albo  The patient consented to the above procedure with the above diagnoses. Preoperative antibiotics were given. Leg position was good.  22 French cystoscope was utilized. Bladder mucosa and trigone were normal. Is no evidence of cystitis.  Under fluoroscopic and cystoscopic guidance I passed a sensor wire to the lower mid ureter. I passed a 6 [place] open-ended ureteral catheter to this height and removed the wire. I did a gentle retrograde ureterogram with approximate 5 cc of contrast  Retrograde ureterogram: In the AP GU I did a retrograde as dictated above. She had no hydronephrosis but filling defect just distal to the ureteropelvic junction. She had mild dilation of the renal pelvis.  A sensor wire was placed through the open-end ureteral catheter into the upper pole calyx removing the ureteral catheter. A well-prepared 26 cm x 6 French double-J stent without a string was placed into the kidney under fluoroscopic and cystoscopic guidance. Curled nicely in the renal pelvis and also in the bladder. There was a mild volcano affect  Bladder was emptied. A thick taken to recover

## 2012-09-23 ENCOUNTER — Encounter (HOSPITAL_COMMUNITY): Payer: Self-pay | Admitting: Urology

## 2012-09-23 LAB — URINE CULTURE: Colony Count: 25000

## 2012-09-23 MED ORDER — MIDAZOLAM HCL 5 MG/5ML IJ SOLN
INTRAMUSCULAR | Status: DC | PRN
  Administered 2012-09-22: 2 mg via INTRAVENOUS

## 2012-09-23 NOTE — Addendum Note (Signed)
Addendum  created 09/23/12 1126 by Venice Marcucci M Jaasiel Hollyfield, CRNA   Modules edited:Anesthesia Medication Administration    

## 2012-09-23 NOTE — Addendum Note (Signed)
Addendum  created 09/23/12 1126 by Paris Lore, CRNA   Modules edited:Anesthesia Medication Administration

## 2012-09-29 ENCOUNTER — Other Ambulatory Visit: Payer: Self-pay | Admitting: Urology

## 2012-10-03 ENCOUNTER — Encounter (HOSPITAL_BASED_OUTPATIENT_CLINIC_OR_DEPARTMENT_OTHER): Payer: Self-pay | Admitting: *Deleted

## 2012-10-03 NOTE — Progress Notes (Signed)
NPO AFTER MN. ARRIVES AT 0815. CURRENT CBC, BMET , AND NEG. URINE PREG. IN EPIC AND CHART.  ASK MDA IF ANY LAB NEEDED. MAY TAKE DILAUDID IF NEEDED W/ SIP OF WATER.

## 2012-10-05 ENCOUNTER — Encounter (HOSPITAL_BASED_OUTPATIENT_CLINIC_OR_DEPARTMENT_OTHER): Payer: Self-pay | Admitting: *Deleted

## 2012-10-05 ENCOUNTER — Encounter (HOSPITAL_BASED_OUTPATIENT_CLINIC_OR_DEPARTMENT_OTHER)
Admission: RE | Disposition: A | Discharge status: Home / Self Care | Payer: Self-pay | Source: Ambulatory Visit | Attending: Urology

## 2012-10-05 ENCOUNTER — Encounter (HOSPITAL_BASED_OUTPATIENT_CLINIC_OR_DEPARTMENT_OTHER): Payer: Self-pay | Admitting: Anesthesiology

## 2012-10-05 ENCOUNTER — Ambulatory Visit (HOSPITAL_BASED_OUTPATIENT_CLINIC_OR_DEPARTMENT_OTHER)
Admission: RE | Admit: 2012-10-05 | Discharge: 2012-10-05 | Disposition: A | Discharge status: Home / Self Care | Payer: Managed Care, Other (non HMO) | Source: Ambulatory Visit | Attending: Urology | Admitting: Urology

## 2012-10-05 ENCOUNTER — Ambulatory Visit (HOSPITAL_BASED_OUTPATIENT_CLINIC_OR_DEPARTMENT_OTHER): Payer: Managed Care, Other (non HMO) | Admitting: Anesthesiology

## 2012-10-05 DIAGNOSIS — N200 Calculus of kidney: Secondary | ICD-10-CM | POA: Insufficient documentation

## 2012-10-05 HISTORY — DX: Adverse effect of unspecified anesthetic, initial encounter: T41.45XA

## 2012-10-05 HISTORY — DX: Calculus of kidney: N20.0

## 2012-10-05 HISTORY — PX: CYSTOSCOPY W/ URETERAL STENT REMOVAL: SHX1430

## 2012-10-05 HISTORY — PX: URETEROSCOPY: SHX842

## 2012-10-05 HISTORY — DX: Other complications of anesthesia, initial encounter: T88.59XA

## 2012-10-05 HISTORY — DX: Unspecified asthma, uncomplicated: J45.909

## 2012-10-05 HISTORY — DX: Frequency of micturition: R35.0

## 2012-10-05 SURGERY — URETEROSCOPY
Anesthesia: General | Site: Ureter | Laterality: Left | Wound class: Clean Contaminated

## 2012-10-05 MED ORDER — SODIUM CHLORIDE 0.9 % IR SOLN
Status: DC | PRN
  Administered 2012-10-05: 6000 mL

## 2012-10-05 MED ORDER — ONDANSETRON HCL 4 MG/2ML IJ SOLN
INTRAMUSCULAR | Status: DC | PRN
  Administered 2012-10-05: 4 mg via INTRAVENOUS

## 2012-10-05 MED ORDER — PROMETHAZINE HCL 25 MG/ML IJ SOLN
6.2500 mg | INTRAMUSCULAR | Status: DC | PRN
  Administered 2012-10-05: 6.25 mg via INTRAVENOUS

## 2012-10-05 MED ORDER — FENTANYL CITRATE 0.05 MG/ML IJ SOLN
INTRAMUSCULAR | Status: DC | PRN
  Administered 2012-10-05 (×3): 50 ug via INTRAVENOUS
  Administered 2012-10-05: 100 ug via INTRAVENOUS
  Administered 2012-10-05 (×2): 25 ug via INTRAVENOUS

## 2012-10-05 MED ORDER — LIDOCAINE HCL (CARDIAC) 20 MG/ML IV SOLN
INTRAVENOUS | Status: DC | PRN
  Administered 2012-10-05: 100 mg via INTRAVENOUS

## 2012-10-05 MED ORDER — FENTANYL CITRATE 0.05 MG/ML IJ SOLN
25.0000 ug | INTRAMUSCULAR | Status: DC | PRN
  Administered 2012-10-05 (×2): 25 ug via INTRAVENOUS

## 2012-10-05 MED ORDER — SENNA-DOCUSATE SODIUM 8.6-50 MG PO TABS: 1.0000 | ORAL_TABLET | Freq: Two times a day (BID) | ORAL | Status: DC

## 2012-10-05 MED ORDER — TAMSULOSIN HCL 0.4 MG PO CAPS: 0.4000 mg | ORAL_CAPSULE | Freq: Every day | ORAL | Status: DC

## 2012-10-05 MED ORDER — MIDAZOLAM HCL 5 MG/5ML IJ SOLN
INTRAMUSCULAR | Status: DC | PRN
  Administered 2012-10-05: 2 mg via INTRAVENOUS

## 2012-10-05 MED ORDER — IOHEXOL 300 MG/ML  SOLN
INTRAMUSCULAR | Status: DC | PRN
  Administered 2012-10-05: 20 mL

## 2012-10-05 MED ORDER — HYDROMORPHONE HCL 2 MG PO TABS: 2.0000 mg | ORAL_TABLET | Freq: Four times a day (QID) | ORAL | Status: DC | PRN

## 2012-10-05 MED ORDER — MEPERIDINE HCL 25 MG/ML IJ SOLN: 6.2500 mg | INTRAMUSCULAR | Status: DC | PRN

## 2012-10-05 MED ORDER — PROPOFOL 10 MG/ML IV BOLUS
INTRAVENOUS | Status: DC | PRN
  Administered 2012-10-05: 200 mg via INTRAVENOUS
  Administered 2012-10-05: 150 mg via INTRAVENOUS

## 2012-10-05 MED ORDER — LACTATED RINGERS IV SOLN: INTRAVENOUS | Status: DC

## 2012-10-05 MED ORDER — LACTATED RINGERS IV SOLN
INTRAVENOUS | Status: DC
  Administered 2012-10-05 (×2): via INTRAVENOUS
  Administered 2012-10-05: 100 mL/h via INTRAVENOUS

## 2012-10-05 MED ORDER — ACETAMINOPHEN 10 MG/ML IV SOLN
INTRAVENOUS | Status: DC | PRN
  Administered 2012-10-05: 1000 mg via INTRAVENOUS

## 2012-10-05 MED ORDER — KETOROLAC TROMETHAMINE 30 MG/ML IJ SOLN
INTRAMUSCULAR | Status: DC | PRN
  Administered 2012-10-05: 30 mg via INTRAVENOUS

## 2012-10-05 MED ORDER — DEXAMETHASONE SODIUM PHOSPHATE 4 MG/ML IJ SOLN
INTRAMUSCULAR | Status: DC | PRN
  Administered 2012-10-05: 10 mg via INTRAVENOUS

## 2012-10-05 MED ORDER — GENTAMICIN SULFATE 40 MG/ML IJ SOLN
80.0000 mg | Freq: Once | INTRAVENOUS | Status: AC
  Administered 2012-10-05: 80 mg via INTRAVENOUS

## 2012-10-05 SURGICAL SUPPLY — 26 items
BAG URO CATCHER STRL LF (DRAPE) ×3 IMPLANT
BASKET LASER NITINOL 1.9FR (BASKET) ×3 IMPLANT
BASKET ZERO TIP NITINOL 2.4FR (BASKET) IMPLANT
BOSTON SCIENTIFIC ×6 IMPLANT
CANISTER SUCT LVC 12 LTR MEDI- (MISCELLANEOUS) ×3 IMPLANT
CATH FOLEY 2WAY  3CC  8FR (CATHETERS)
CATH FOLEY 2WAY 3CC 8FR (CATHETERS) IMPLANT
CATH INTERMIT  6FR 70CM (CATHETERS) ×3 IMPLANT
CLOTH BEACON ORANGE TIMEOUT ST (SAFETY) ×3 IMPLANT
DRAPE CAMERA CLOSED 9X96 (DRAPES) ×3 IMPLANT
GLOVE BIO SURGEON STRL SZ7 (GLOVE) ×3 IMPLANT
GLOVE ECLIPSE 7.0 STRL STRAW (GLOVE) ×3 IMPLANT
GLOVE SKINSENSE NS SZ7.0 (GLOVE) ×1
GLOVE SKINSENSE STRL SZ7.0 (GLOVE) ×2 IMPLANT
GOWN PREVENTION PLUS XLARGE (GOWN DISPOSABLE) IMPLANT
GOWN STRL NON-REIN LRG LVL3 (GOWN DISPOSABLE) ×6 IMPLANT
GUIDEWIRE ANG ZIPWIRE 038X150 (WIRE) ×3 IMPLANT
GUIDEWIRE STR DUAL SENSOR (WIRE) ×3 IMPLANT
IV NS IRRIG 3000ML ARTHROMATIC (IV SOLUTION) ×6 IMPLANT
NS IRRIG 500ML POUR BTL (IV SOLUTION) ×3 IMPLANT
PACK CYSTOSCOPY (CUSTOM PROCEDURE TRAY) ×3 IMPLANT
SHEATH ACCESS URETERAL 38CM (SHEATH) ×3 IMPLANT
STENT URET 6FRX24 CONTOUR (STENTS) ×3 IMPLANT
SYR 20CC LL (SYRINGE) ×3 IMPLANT
SYRINGE 10CC LL (SYRINGE) ×3 IMPLANT
TUBE FEEDING 8FR 16IN STR KANG (MISCELLANEOUS) ×3 IMPLANT

## 2012-10-05 NOTE — Anesthesia Postprocedure Evaluation (Signed)
  Anesthesia Post-op Note  Patient: Whitney Shelton  Procedure(s) Performed: Procedure(s) (LRB): URETEROSCOPY (Left) HOLMIUM LASER APPLICATION (Left) CYSTOSCOPY WITH STENT PLACEMENT (Left) CYSTOSCOPY WITH STENT REMOVAL ()  Patient Location: PACU  Anesthesia Type: General  Level of Consciousness: awake and alert   Airway and Oxygen Therapy: Patient Spontanous Breathing  Post-op Pain: mild  Post-op Assessment: Post-op Vital signs reviewed, Patient's Cardiovascular Status Stable, Respiratory Function Stable, Patent Airway and No signs of Nausea or vomiting  Post-op Vital Signs: stable  Complications: No apparent anesthesia complications

## 2012-10-05 NOTE — Anesthesia Preprocedure Evaluation (Addendum)
Anesthesia Evaluation  Patient identified by MRN, date of birth, ID band Patient awake    Reviewed: Allergy & Precautions, H&P , NPO status , Patient's Chart, lab work & pertinent test results  History of Anesthesia Complications (+) PROLONGED EMERGENCE  Airway Mallampati: II TM Distance: >3 FB Neck ROM: Full    Dental  (+) Teeth Intact and Dental Advisory Given,    Pulmonary neg pulmonary ROS, asthma ,  breath sounds clear to auscultation        Cardiovascular Rhythm:Regular Rate:Normal     Neuro/Psych  Headaches, negative psych ROS   GI/Hepatic negative GI ROS, Neg liver ROS,   Endo/Other  negative endocrine ROS  Renal/GU negative Renal ROS     Musculoskeletal negative musculoskeletal ROS (+)   Abdominal   Peds  Hematology negative hematology ROS (+)   Anesthesia Other Findings   Reproductive/Obstetrics                           Anesthesia Physical  Anesthesia Plan  ASA: II  Anesthesia Plan: General   Post-op Pain Management:    Induction: Intravenous  Airway Management Planned: LMA  Additional Equipment:   Intra-op Plan:   Post-operative Plan: Extubation in OR  Informed Consent: I have reviewed the patients History and Physical, chart, labs and discussed the procedure including the risks, benefits and alternatives for the proposed anesthesia with the patient or authorized representative who has indicated his/her understanding and acceptance.   Dental advisory given  Plan Discussed with: CRNA  Anesthesia Plan Comments:        Anesthesia Quick Evaluation

## 2012-10-05 NOTE — Transfer of Care (Signed)
Immediate Anesthesia Transfer of Care Note  Patient: Whitney Shelton  Procedure(s) Performed: Procedure(s) (LRB) with comments: URETEROSCOPY (Left) HOLMIUM LASER APPLICATION (Left) CYSTOSCOPY WITH STENT PLACEMENT (Left) CYSTOSCOPY WITH STENT REMOVAL ()  Patient Location: PACU  Anesthesia Type: General  Level of Consciousness: sedated and responds to stimulation  Airway & Oxygen Therapy: Patient Spontanous Breathing and Patient connected to nasal cannula oxygen  Post-op Assessment: Report given to PACU RN  Post vital signs: Reviewed and stable  Complications: No apparent anesthesia complications

## 2012-10-05 NOTE — H&P (Signed)
Whitney Shelton is an 46 y.o. female.   Chief Complaint: Pre-Op Left Ureteroscopic Stone Manipulation HPI:  1 - Left Ureteral / Renal Stones  - Pt with large volume left sided nephrolithiasis including 11mm UPJ and 7mm lower pole stones found on w/u flank pain and subequently stented 09/21/2012 by Dr. Sherron Monday. Orrignial plan was for shockwave lithotripsy, but stones very hard to see on KUB and relatively soft at approx 400HU. We re-discussed options including SWL, URS, PCNL and pt has opted for URS, understanding that it may require staged surgery.  Recent UCX negative.  No sig CV disease, No strong blood thinners.  Past Medical History  Diagnosis Date  . Migraine   . Fibroids   . Unspecified asthma HX BRONCHIAL ASTHMA  --- LAST BOUT OF BRONCHITIS LAST WINTER  2012  . Frequency of urination   . Urgency of urination   . Nocturia   . Renal calculus or stone LEFT SIDE M X1  NON-OBSTRUCTIVE  . Left ureteral calculus   . Complication of anesthesia HARD TO WAKE    Past Surgical History  Procedure Date  . Cystoscopy w/ ureteral stent placement 09/22/2012    Procedure: CYSTOSCOPY WITH RETROGRADE PYELOGRAM/URETERAL STENT PLACEMENT;  Surgeon: Martina Sinner, MD;  Location: [facility];  Service: Urology;  Laterality: Left;  . Endometrial ablation w/ novasure 04-25-2004  . Hysteroscopy with resectoscope 01-04-2004    RESECTION FIBROID  . Wisdom tooth extraction AGE 60    GEN. ANES.    History reviewed. No pertinent family history. Social History:  reports that she has never smoked. She does not have any smokeless tobacco history on file. She reports that she does not drink alcohol or use illicit drugs.  Allergies:  Allergies  Allergen Reactions  . Vicodin (Hydrocodone-Acetaminophen) Nausea Only  . Percocet (Oxycodone-Acetaminophen) Nausea Only    No prescriptions prior to admission    No results found for this or any previous visit (from the past 48 hour(s)). No results  found.  Review of Systems  Constitutional: Negative.   HENT: Negative.   Eyes: Negative.   Respiratory: Negative.   Cardiovascular: Negative.   Gastrointestinal: Negative.   Genitourinary: Negative.        Mild stent colic  Musculoskeletal: Negative.   Skin: Negative.   Neurological: Negative.   Endo/Heme/Allergies: Negative.   Psychiatric/Behavioral: Negative.     Height 5\' 6"  (1.676 m), weight 90.719 kg (200 lb), last menstrual period 09/14/2012. Physical Exam  Constitutional: She is oriented to person, place, and time. She appears well-developed and well-nourished.  HENT:  Head: Normocephalic.  Eyes: EOM are normal.  Neck: Normal range of motion. Neck supple.  Cardiovascular: Normal rate.   Respiratory: Effort normal.  GI: Soft. Bowel sounds are normal.  Genitourinary:       Minimal left CVAT  Musculoskeletal: Normal range of motion.  Neurological: She is alert and oriented to person, place, and time.  Skin: Skin is warm and dry.  Psychiatric: She has a normal mood and affect. Her behavior is normal. Judgment and thought content normal.     Assessment/Plan 1 - Left Ureteral / Renal Stones  - Proceed with left ureteroscopic stone manipulation. Risks including bleeding, infection, damage to kidney / ureter / bladder, loss of kidney, need for staged surgery again discussed.  Joshual Terrio 10/05/2012, 6:11 AM

## 2012-10-05 NOTE — Brief Op Note (Signed)
10/05/2012  11:33 AM  PATIENT:  Whitney Shelton  46 y.o. female  PRE-OPERATIVE DIAGNOSIS:  LEFT RENAL / URETERAL STONES  POST-OPERATIVE DIAGNOSIS:  LEFT RENAL / URETERAL STONE  PROCEDURE:  Procedure(s) (LRB) with comments: URETEROSCOPY (Left) HOLMIUM LASER APPLICATION (Left) CYSTOSCOPY WITH STENT PLACEMENT (Left) CYSTOSCOPY WITH STENT REMOVAL ()  SURGEON:  Surgeon(s) and Role:    * Sebastian Ache, MD - Primary  PHYSICIAN ASSISTANT:   ASSISTANTS: none   ANESTHESIA:   general  EBL:  Total I/O In: 1000 [I.V.:1000] Out: -   BLOOD ADMINISTERED:none  DRAINS: none   LOCAL MEDICATIONS USED:  NONE  SPECIMEN:  Source of Specimen:  Left Renal Pelvis - Stone  DISPOSITION OF SPECIMEN:  [facility] - Compositional Analysis  COUNTS:  YES  TOURNIQUET:  * No tourniquets in log *  DICTATION: .Other Dictation: Dictation Number   [id number]  PLAN OF CARE: Discharge to home after PACU  PATIENT DISPOSITION:  PACU - hemodynamically stable.   Delay start of Pharmacological VTE agent (>24hrs) due to surgical blood loss or risk of bleeding: not applicable

## 2012-10-05 NOTE — Anesthesia Procedure Notes (Signed)
Procedure Name: LMA Insertion Date/Time: 10/05/2012 9:30 AM Performed by: Maris Berger T Pre-anesthesia Checklist: Patient identified, Emergency Drugs available, Suction available and Patient being monitored Patient Re-evaluated:Patient Re-evaluated prior to inductionOxygen Delivery Method: Circle System Utilized Preoxygenation: Pre-oxygenation with 100% oxygen Intubation Type: IV induction Ventilation: Mask ventilation without difficulty LMA: LMA inserted LMA Size: 4.0 Number of attempts: 1 Placement Confirmation: positive ETCO2 Dental Injury: Teeth and Oropharynx as per pre-operative assessment  Comments: Gauze roll between teeth

## 2012-10-06 ENCOUNTER — Encounter (HOSPITAL_BASED_OUTPATIENT_CLINIC_OR_DEPARTMENT_OTHER): Payer: Self-pay | Admitting: Urology

## 2012-10-06 ENCOUNTER — Other Ambulatory Visit: Payer: Self-pay | Admitting: Urology

## 2012-10-06 NOTE — Op Note (Signed)
NAME:  Whitney Shelton, THUMAN NO.:  [id number]  MEDICAL RECORD NO.:  [id number]  LOCATION:                                 FACILITY:  PHYSICIAN:  Sebastian Ache, MD     DATE OF BIRTH:  [birth year]  DATE OF PROCEDURE: DATE OF DISCHARGE:  10/05/2012                              OPERATIVE REPORT   DIAGNOSIS:  Large volume left renal stone.  PROCEDURES: 1. First stage left ureteroscopic stone manipulation with laser     lithotripsy. 2. Exchange of left ureteral stent, 6 x 24, no tether. 3. Left retrograde pyelogram with interpretation.  FINDINGS: 1. Two large intrarenal stones on the left; one approximately 12 mm,     the other approximately 8 mm. 2. Otherwise unremarkable left kidney, ureter and bladder.  SPECIMEN:  Left renal stone for compositional analysis.  COMPLICATIONS:  None.  INDICATION:  Ms. Slatten is a pleasant 46 year old female with unremarkable medical history, who was found on workup of left flank pain to have large volume of left renal stones including a UPJ stone that was obstructing.  She underwent urgent stenting on September 21, 2012, by my colleague Dr. Sherron Monday, who planned for definitive management of her stone likely with shockwave lithotripsy.  Unfortunately, her stone was such low density, was not easily targetable with fluoroscopy as such. We discussed the options with staged ureteroscopy versus percutaneous approach.  Since, the patient was received a staged ureteroscopy. Informed consent was obtained and placed in the medical record.  PROCEDURE IN DETAIL:  The patient being Lorenza Shakir, was verified, procedure being left first stage ureteroscopic laser lithotripsy was confirmed.  Procedure was carried out.  Time-out was performed. Intravenous antibiotics were administered.  General LMA anesthesia was introduced.  The patient was placed into a low lithotomy position. Sterile field was created by prepping and draping the patient's  vagina, introitus, and proximal thighs using iodine x3.  Next, cystourethroscopy was performed using a 22-French rigid cystoscope with 12-degrees offset lens.  Inspection of the urinary bladder revealed no diverticula, calcifications, papular lesions.  Distal end of the ureteral stent was seen in situ.  This was grasped and brought to the level of urethral meatus, through which, a 0.038 Glidewire was advanced at the level of the upper pole of the kidney.  The was exchanged for a 6-French end-hole catheter and left retrograde pyelogram was seen.  Left retrograde pyelogram demonstrated a single left ureter and single system left kidney.  There were two large filling defects in the renal pelvis consistent with known stone.  There were no narrowings of the ureter.  The Glidewire was once again advanced and set aside as a safety wire.  Next, semi-rigid ureteroscopy was performed using a 6.5-French semi-rigid ureteroscope to the distal 2/3 of the ureter.  No mucosal abnormalities.  Calcifications were encountered.  A separate 0.038 Sensor wire was advanced at the level of the upper pole and the semi- rigid ureteroscope was exchanged for a 12/14 38-cm ureteral access sheath, which was placed under continuous fluoroscopy to the level of the proximal ureter.  Next, the 8-French digital flexible ureteroscope was used and systematic endoscopy was  performed at the proximal ureter in each calix.  This indeed corroborated two large ovoid stones, one approximately 12 mm in diameter and other approximately 9 mm in diameter.  Next, a 200 nanometer fiber was used to apply holmium laser energy in the settings of 0.6 joules and 6 Hz for approximately 2 hours fragmenting.  The vast majority of the stone into fragments that were 3 mm or less.  At least 50% of the stone volume was dusted.  This was successfully irrigated.  Next, an escape-type basket was used to grasp particles and neck were removed  sequentially for another hour.  By this point, approximately 90% percent of the visible stone volume had been successfully fragmented and removed, and however, the patient had been in lithotomy position for approximately 3 hours, it was felt that a stage approach was indeed warranted based on stone volume.  Decision was made to terminate the procedure today.  As such, final retrograde pyelogram was obtained, which revealed no extravasation of contrast and no visualization of further filling defects within the left renal pelvis.  The access sheath was removed on continuous ureteroscopy and no mucosal abnormalities were found.  A new 6 x 24 double-J stent was placed over the remaining safety wire using cystoscopic and fluoroscopic guidance.  Good proximal and distal curl were noted.  Efflux of the urine was seen around into the distal end of the stent.  Bladder was inspected and uninjured.  Bladder was emptied per cystoscope.  Procedure was then terminated.  The patient tolerated the procedure well.  There were no immediate periprocedural complications.  The patient was taken to the postanesthesia care unit in excellent condition.          ______________________________ Sebastian Ache, MD     TM/MEDQ  D:  10/05/2012  T:  10/06/2012  Job:  [id number]

## 2012-10-24 ENCOUNTER — Encounter (HOSPITAL_BASED_OUTPATIENT_CLINIC_OR_DEPARTMENT_OTHER): Payer: Self-pay | Admitting: *Deleted

## 2012-10-24 NOTE — Progress Notes (Addendum)
NPO AFTER MN. ARRIVES AT 0715. NEEDS HG.  ASK MDA IF URINE PREG NEED BE REPEATED, LAST ONE DONE ON 09-21-2012 WAS NEGATIVE (HX ENDOMETRIAL ABLATION IN 2005 AND LAST PERIOD 10-07-2012).

## 2012-10-25 NOTE — Anesthesia Preprocedure Evaluation (Addendum)
Anesthesia Evaluation  Patient identified by MRN, date of birth, ID band Patient awake    Reviewed: Allergy & Precautions, H&P , NPO status , Patient's Chart, lab work & pertinent test results  Airway Mallampati: II TM Distance: >3 FB Neck ROM: full    Dental  (+) Teeth Intact and Dental Advisory Given,    Pulmonary neg pulmonary ROS, asthma ,  Occasional bronchial asthma/ brochitis breath sounds clear to auscultation  Pulmonary exam normal       Cardiovascular Exercise Tolerance: Good negative cardio ROS  Rhythm:regular Rate:Normal     Neuro/Psych negative neurological ROS  negative psych ROS   GI/Hepatic negative GI ROS, Neg liver ROS,   Endo/Other  negative endocrine ROS  Renal/GU negative Renal ROS  negative genitourinary   Musculoskeletal   Abdominal   Peds  Hematology negative hematology ROS (+)   Anesthesia Other Findings   Reproductive/Obstetrics negative OB ROS                          Anesthesia Physical Anesthesia Plan  ASA: II  Anesthesia Plan: General   Post-op Pain Management:    Induction: Intravenous  Airway Management Planned: LMA  Additional Equipment:   Intra-op Plan:   Post-operative Plan:   Informed Consent: I have reviewed the patients History and Physical, chart, labs and discussed the procedure including the risks, benefits and alternatives for the proposed anesthesia with the patient or authorized representative who has indicated his/her understanding and acceptance.   Dental Advisory Given  Plan Discussed with: Surgeon  Anesthesia Plan Comments:         Anesthesia Quick Evaluation

## 2012-10-26 ENCOUNTER — Ambulatory Visit (HOSPITAL_BASED_OUTPATIENT_CLINIC_OR_DEPARTMENT_OTHER)
Admission: RE | Admit: 2012-10-26 | Discharge: 2012-10-26 | Disposition: A | Discharge status: Home / Self Care | Payer: Managed Care, Other (non HMO) | Source: Ambulatory Visit | Attending: Urology | Admitting: Urology

## 2012-10-26 ENCOUNTER — Encounter (HOSPITAL_BASED_OUTPATIENT_CLINIC_OR_DEPARTMENT_OTHER): Payer: Self-pay | Admitting: *Deleted

## 2012-10-26 ENCOUNTER — Encounter (HOSPITAL_BASED_OUTPATIENT_CLINIC_OR_DEPARTMENT_OTHER)
Admission: RE | Disposition: A | Discharge status: Home / Self Care | Payer: Self-pay | Source: Ambulatory Visit | Attending: Urology

## 2012-10-26 ENCOUNTER — Encounter (HOSPITAL_BASED_OUTPATIENT_CLINIC_OR_DEPARTMENT_OTHER): Payer: Self-pay | Admitting: Anesthesiology

## 2012-10-26 ENCOUNTER — Ambulatory Visit (HOSPITAL_COMMUNITY): Payer: Managed Care, Other (non HMO)

## 2012-10-26 ENCOUNTER — Ambulatory Visit (HOSPITAL_BASED_OUTPATIENT_CLINIC_OR_DEPARTMENT_OTHER): Payer: Managed Care, Other (non HMO) | Admitting: Anesthesiology

## 2012-10-26 DIAGNOSIS — N201 Calculus of ureter: Secondary | ICD-10-CM | POA: Insufficient documentation

## 2012-10-26 DIAGNOSIS — N200 Calculus of kidney: Secondary | ICD-10-CM | POA: Insufficient documentation

## 2012-10-26 DIAGNOSIS — Z79899 Other long term (current) drug therapy: Secondary | ICD-10-CM | POA: Insufficient documentation

## 2012-10-26 HISTORY — PX: CYSTOSCOPY WITH RETROGRADE PYELOGRAM, URETEROSCOPY AND STENT PLACEMENT: SHX5789

## 2012-10-26 SURGERY — CYSTOURETEROSCOPY, WITH RETROGRADE PYELOGRAM AND STENT INSERTION
Anesthesia: General | Site: Ureter | Laterality: Left | Wound class: Clean Contaminated

## 2012-10-26 MED ORDER — MIDAZOLAM HCL 5 MG/5ML IJ SOLN
INTRAMUSCULAR | Status: DC | PRN
  Administered 2012-10-26: 2 mg via INTRAVENOUS

## 2012-10-26 MED ORDER — PROPOFOL 10 MG/ML IV BOLUS
INTRAVENOUS | Status: DC | PRN
  Administered 2012-10-26: 300 mg via INTRAVENOUS

## 2012-10-26 MED ORDER — LACTATED RINGERS IV SOLN
INTRAVENOUS | Status: DC
  Administered 2012-10-26 (×2): via INTRAVENOUS
  Filled 2012-10-26: qty 1000

## 2012-10-26 MED ORDER — FENTANYL CITRATE 0.05 MG/ML IJ SOLN
25.0000 ug | INTRAMUSCULAR | Status: DC | PRN
  Administered 2012-10-26: 25 ug via INTRAVENOUS
  Filled 2012-10-26: qty 1

## 2012-10-26 MED ORDER — DEXAMETHASONE SODIUM PHOSPHATE 4 MG/ML IJ SOLN
INTRAMUSCULAR | Status: DC | PRN
  Administered 2012-10-26: 8 mg via INTRAVENOUS

## 2012-10-26 MED ORDER — HYDROMORPHONE HCL 2 MG PO TABS: 2.0000 mg | ORAL_TABLET | ORAL | Status: DC | PRN

## 2012-10-26 MED ORDER — ACETAMINOPHEN 10 MG/ML IV SOLN
INTRAVENOUS | Status: DC | PRN
  Administered 2012-10-26: 1000 mg via INTRAVENOUS

## 2012-10-26 MED ORDER — ONDANSETRON HCL 4 MG/2ML IJ SOLN
INTRAMUSCULAR | Status: DC | PRN
  Administered 2012-10-26: 4 mg via INTRAVENOUS

## 2012-10-26 MED ORDER — LACTATED RINGERS IV SOLN
INTRAVENOUS | Status: DC
  Filled 2012-10-26: qty 1000

## 2012-10-26 MED ORDER — KETOROLAC TROMETHAMINE 30 MG/ML IJ SOLN
INTRAMUSCULAR | Status: DC | PRN
  Administered 2012-10-26: 30 mg via INTRAVENOUS

## 2012-10-26 MED ORDER — GENTAMICIN IN SALINE 1.6-0.9 MG/ML-% IV SOLN
80.0000 mg | INTRAVENOUS | Status: AC
  Administered 2012-10-26: 350 mg via INTRAVENOUS
  Filled 2012-10-26: qty 50

## 2012-10-26 MED ORDER — FENTANYL CITRATE 0.05 MG/ML IJ SOLN
INTRAMUSCULAR | Status: DC | PRN
  Administered 2012-10-26 (×4): 50 ug via INTRAVENOUS

## 2012-10-26 MED ORDER — LIDOCAINE HCL (CARDIAC) 20 MG/ML IV SOLN
INTRAVENOUS | Status: DC | PRN
  Administered 2012-10-26: 70 mg via INTRAVENOUS

## 2012-10-26 SURGICAL SUPPLY — 41 items
ADAPTER CATH URET PLST 4-6FR (CATHETERS) IMPLANT
BAG DRAIN URO-CYSTO SKYTR STRL (DRAIN) ×3 IMPLANT
BAG URO CATCHER STRL LF (DRAPE) ×3 IMPLANT
BASKET LASER NITINOL 1.9FR (BASKET) ×3 IMPLANT
BASKET STNLS GEMINI 4WIRE 3FR (BASKET) IMPLANT
BASKET ZERO TIP NITINOL 2.4FR (BASKET) IMPLANT
BRUSH URET BIOPSY 3F (UROLOGICAL SUPPLIES) IMPLANT
CANISTER SUCT LVC 12 LTR MEDI- (MISCELLANEOUS) IMPLANT
CATH FOLEY 2WAY  3CC  8FR (CATHETERS) ×1
CATH FOLEY 2WAY 3CC 8FR (CATHETERS) ×2 IMPLANT
CATH INTERMIT  6FR 70CM (CATHETERS) ×3 IMPLANT
CATH URET 5FR 28IN CONE TIP (BALLOONS)
CATH URET 5FR 28IN OPEN ENDED (CATHETERS) IMPLANT
CATH URET 5FR 70CM CONE TIP (BALLOONS) IMPLANT
CLOTH BEACON ORANGE TIMEOUT ST (SAFETY) ×3 IMPLANT
DRAPE CAMERA CLOSED 9X96 (DRAPES) ×3 IMPLANT
ELECT REM PT RETURN 9FT ADLT (ELECTROSURGICAL)
ELECTRODE REM PT RTRN 9FT ADLT (ELECTROSURGICAL) IMPLANT
GLOVE BIO SURGEON STRL SZ7 (GLOVE) ×3 IMPLANT
GLOVE BIO SURGEON STRL SZ7.5 (GLOVE) ×3 IMPLANT
GLOVE INDICATOR 6.5 STRL GRN (GLOVE) ×6 IMPLANT
GOWN PREVENTION PLUS LG XLONG (DISPOSABLE) ×3 IMPLANT
GOWN PREVENTION PLUS XLARGE (GOWN DISPOSABLE) IMPLANT
GOWN STRL NON-REIN LRG LVL3 (GOWN DISPOSABLE) ×6 IMPLANT
GOWN STRL REIN XL XLG (GOWN DISPOSABLE) ×3 IMPLANT
GUIDEWIRE 0.038 PTFE COATED (WIRE) IMPLANT
GUIDEWIRE ANG ZIPWIRE 038X150 (WIRE) ×3 IMPLANT
GUIDEWIRE STR DUAL SENSOR (WIRE) ×3 IMPLANT
IV NS IRRIG 3000ML ARTHROMATIC (IV SOLUTION) ×6 IMPLANT
KIT BALLIN UROMAX 15FX10 (LABEL) IMPLANT
KIT BALLN UROMAX 15FX4 (MISCELLANEOUS) IMPLANT
KIT BALLN UROMAX 26 75X4 (MISCELLANEOUS)
PACK CYSTOSCOPY (CUSTOM PROCEDURE TRAY) ×3 IMPLANT
SET HIGH PRES BAL DIL (LABEL)
SHEATH URET ACCESS 12FR/35CM (UROLOGICAL SUPPLIES) IMPLANT
SHEATH URET ACCESS 12FR/55CM (UROLOGICAL SUPPLIES) IMPLANT
STENT CONTOUR 6FRX24X.038 (STENTS) IMPLANT
STENT URET 6FRX24 CONTOUR (STENTS) ×3 IMPLANT
SYRINGE 10CC LL (SYRINGE) ×3 IMPLANT
SYRINGE IRR TOOMEY STRL 70CC (SYRINGE) IMPLANT
TUBE FEEDING 8FR 16IN STR KANG (MISCELLANEOUS) IMPLANT

## 2012-10-26 NOTE — H&P (Signed)
Whitney Shelton is an 46 y.o. female.   Chief Complaint: Pre-Op 2nd Stage Left Ureteroscopic Laser Stone Manipulation HPI:  1 - Left Ureteral / Renal Stones  - Pt with large volume left sided nephrolithiasis including 11mm UPJ and 7mm lower pole stones found on w/u flank pain and subequently stented 09/21/2012 by Dr. Sherron Monday. Orrignial plan was for shockwave lithotripsy, but stones very hard to see on KUB and relatively soft at approx 400HU. She therefore underwent 1st stage left ureteroscopic stone manipulation on 10/05/2012 at which point approximatly 60-70% of her stone volume was removed. Composition mostly Urate (80%).  [name] now presents for 2nd stage surgery. No interval fevers.  Recent UCX negative.  No sig CV disease, No strong blood thinners.  Past Medical History  Diagnosis Date  . Migraine   . Fibroids   . Unspecified asthma HX BRONCHIAL ASTHMA  --- LAST BOUT OF BRONCHITIS LAST WINTER  2012  . Frequency of urination   . Urgency of urination   . Nocturia   . Renal calculus or stone LEFT SIDE M X1  NON-OBSTRUCTIVE  . Complication of anesthesia HARD TO WAKE    Past Surgical History  Procedure Date  . Cystoscopy w/ ureteral stent placement 09/22/2012    Procedure: CYSTOSCOPY WITH RETROGRADE PYELOGRAM/URETERAL STENT PLACEMENT;  Surgeon: Martina Sinner, MD;  Location: [facility];  Service: Urology;  Laterality: Left;  . Endometrial ablation w/ novasure 04-25-2004  . Hysteroscopy with resectoscope 01-04-2004    RESECTION FIBROID  . Wisdom tooth extraction AGE 6    GEN. ANES.  Marland Kitchen Ureteroscopy 10/05/2012    Procedure: URETEROSCOPY;  Surgeon: Sebastian Ache, MD;  [facility]  Service: Urology;  Laterality: Left;  . Cystoscopy w/ ureteral stent removal 10/05/2012    Procedure: CYSTOSCOPY WITH STENT REMOVAL;  Surgeon: Sebastian Ache, MD;  Location: [facility];  Service: Urology;;    History reviewed. No pertinent family history. Social  History:  reports that she has never smoked. She does not have any smokeless tobacco history on file. She reports that she does not drink alcohol or use illicit drugs.  Allergies:  Allergies  Allergen Reactions  . Vicodin (Hydrocodone-Acetaminophen) Nausea Only  . Percocet (Oxycodone-Acetaminophen) Nausea Only    Medications Prior to Admission  Medication Sig Dispense Refill  . acetaminophen (TYLENOL) 325 MG tablet Take 650 mg by mouth every 6 (six) hours as needed.      Marland Kitchen HYDROmorphone (DILAUDID) 2 MG tablet Take 2 mg by mouth every 4 (four) hours as needed.      Marland Kitchen ibuprofen (ADVIL,MOTRIN) 200 MG tablet Take 200 mg by mouth every 6 (six) hours as needed.      Marland Kitchen NARATRIPTAN HCL PO Take by mouth as needed.      . sennosides-docusate sodium (SENOKOT-S) 8.6-50 MG tablet Take 1 tablet by mouth 2 (two) times daily. Wile taking pain meds to prevent contipation  30 tablet  0  . solifenacin (VESICARE) 10 MG tablet Take 10 mg by mouth daily.      . Tamsulosin HCl (FLOMAX) 0.4 MG CAPS Take 1 capsule (0.4 mg total) by mouth daily. For stent discomfort and to help pass stone fragments  30 capsule  0    No results found for this or any previous visit (from the past 48 hour(s)). No results found.  Review of Systems  Constitutional: Negative.  Negative for fever and chills.  Eyes: Negative.   Respiratory: Negative.   Cardiovascular: Negative.  Gastrointestinal: Negative.   Genitourinary: Negative.   Musculoskeletal: Negative.   Skin: Negative.   Neurological: Negative.   Endo/Heme/Allergies: Negative.   Psychiatric/Behavioral: Negative.     Height 5\' 6"  (1.676 m), weight 98.431 kg (217 lb), last menstrual period 10/07/2012. Physical Exam  Constitutional: She is oriented to person, place, and time. She appears well-developed and well-nourished.  HENT:  Head: Normocephalic and atraumatic.  Eyes: EOM are normal. Pupils are equal, round, and reactive to light.  Neck: Normal range of motion.  Neck supple.  Cardiovascular: Normal rate and regular rhythm.   Respiratory: Effort normal and breath sounds normal.  GI: Soft. Bowel sounds are normal.  Genitourinary:       No CVAT  Musculoskeletal: Normal range of motion.  Neurological: She is alert and oriented to person, place, and time.  Skin: Skin is warm and dry.  Psychiatric: She has a normal mood and affect. Her behavior is normal. Judgment and thought content normal.     Assessment/Plan 1 - Left Ureteral / Renal Stones  - Proceed with second stage left ureteroscopic stone manipulation. Risks including bleeding, infection, damage to kidney / ureter / bladder, loss of kidney, need for staged surgery again discussed.  Pt would clearly benefit from metabolic eval post-op.  Deyana Wnuk 10/26/2012, 7:14 AM

## 2012-10-26 NOTE — Brief Op Note (Signed)
10/26/2012  9:24 AM  PATIENT:  [name]  46 y.o. female  PRE-OPERATIVE DIAGNOSIS:  LEFT RENAL STONE (RESIDUAL)  POST-OPERATIVE DIAGNOSIS:  * No post-op diagnosis entered *  PROCEDURE:   1 - LEFT SECOND STAGE URETEROSCOPIC STONE MANIPULATION WITH BASKETING OF URETERAL AND RENAL STONE 2 - LEFT RETROGRADE PYELOGRAM WITH INERPRETATION 3 - EXCHANGE OF LEFT URETERAL STENT, 6X24, NO TEATHER  SURGEON:  Surgeon(s) and Role:    * Sebastian Ache, MD - Primary  PHYSICIAN ASSISTANT:   ASSISTANTS: none   ANESTHESIA:   general  EBL:  Total I/O In: 1000 [I.V.:1000] Out: -   BLOOD ADMINISTERED:none  DRAINS: none   LOCAL MEDICATIONS USED:  NONE  SPECIMEN:  Source of Specimen:  left kidney and ureter stone fragemnts  DISPOSITION OF SPECIMEN:  discard  COUNTS:  NO   TOURNIQUET:  * No tourniquets in log *  DICTATION: .Other Dictation: Dictation Number [id number]  PLAN OF CARE: Discharge to home after PACU  PATIENT DISPOSITION:  PACU - hemodynamically stable.   Delay start of Pharmacological VTE agent (>24hrs) due to surgical blood loss or risk of bleeding: no

## 2012-10-26 NOTE — Anesthesia Postprocedure Evaluation (Signed)
  Anesthesia Post-op Note  Patient: Whitney Shelton  Procedure(s) Performed: Procedure(s) (LRB): CYSTOSCOPY WITH RETROGRADE PYELOGRAM, URETEROSCOPY AND STENT PLACEMENT (Left) HOLMIUM LASER APPLICATION (Left)  Patient Location: PACU  Anesthesia Type: General  Level of Consciousness: awake and alert   Airway and Oxygen Therapy: Patient Spontanous Breathing  Post-op Pain: mild  Post-op Assessment: Post-op Vital signs reviewed, Patient's Cardiovascular Status Stable, Respiratory Function Stable, Patent Airway and No signs of Nausea or vomiting  Post-op Vital Signs: stable  Complications: No apparent anesthesia complications

## 2012-10-26 NOTE — Anesthesia Procedure Notes (Signed)
Procedure Name: LMA Insertion Date/Time: 10/26/2012 7:38 AM Performed by: Norva Pavlov Pre-anesthesia Checklist: Patient identified, Emergency Drugs available, Suction available and Patient being monitored Patient Re-evaluated:Patient Re-evaluated prior to inductionOxygen Delivery Method: Circle System Utilized Preoxygenation: Pre-oxygenation with 100% oxygen Intubation Type: IV induction Ventilation: Mask ventilation without difficulty LMA: LMA inserted LMA Size: 4.0 Number of attempts: 1 Airway Equipment and Method: bite block Placement Confirmation: positive ETCO2 Tube secured with: Tape Dental Injury: Teeth and Oropharynx as per pre-operative assessment

## 2012-10-26 NOTE — Transfer of Care (Signed)
Immediate Anesthesia Transfer of Care Note  Patient: Whitney Shelton  Procedure(s) Performed: Procedure(s) (LRB): CYSTOSCOPY WITH RETROGRADE PYELOGRAM, URETEROSCOPY AND STENT PLACEMENT (Left) HOLMIUM LASER APPLICATION (Left)  Patient Location: PACU  Anesthesia Type: General  Level of Consciousness: awake, alert  and oriented  Airway & Oxygen Therapy: Patient Spontanous Breathing and Patient connected to nasal cannula  Post-op Assessment: Report given to PACU RN and Post -op Vital signs reviewed and stable  Post vital signs: Reviewed and stable  Complications: No apparent anesthesia complications

## 2012-10-26 NOTE — OR Nursing (Signed)
10/26/12 OR Nursing- Hard copy OR chart completed

## 2012-10-27 NOTE — Op Note (Deleted)
NAME:  Whitney Shelton, Whitney Shelton                   ACCOUNT NO.:  [id number]  MEDICAL RECORD NO.:  [id number]  LOCATION:                                 FACILITY:  PHYSICIAN:  Torrie Lafavor, MD     DATE OF BIRTH:  10/25/1966  DATE OF PROCEDURE: DATE OF DISCHARGE:  10/05/2012                              OPERATIVE REPORT   DIAGNOSIS:  Residual left renal and ureteral stones.  PROCEDURES: 1. Left second stage ureteroscopic stone manipulation with basketing     of stones. 2. Left retrograde pyelogram interpretation. 3. Cystoscopy with left ureteral stent change 6 x 24, no tether.  COMPLICATIONS:  None.  SPECIMENS:  Small ureteral and renal stone fragments for discard.  FINDINGS: 1. Small volume residual left proximal ureteral and left lower pole     renal stones, total volume less than 1 cm2.  These were entirely     removed. 2. Otherwise unremarkable left retrograde pyelogram.  ESTIMATED BLOOD LOSS:  Nil.  DRAINS:  None.  INDICATION:  Whitney Shelton is a pleasant 46-year-old female with episode of left renal colic last month.  During evaluation, was found to have large volume of left intrarenal stones.  She underwent stenting initially for pain control with plan for shockwave lithotripsy.  However, followup imaging revealed that the stone is very low density and not able to be targeted for shockwave as such additional options including percutaneous approach versus staged ureteroscopy were discussed.  She wished to proceed with the latter.  Informed consent was obtained and placed in the medical record.  Notably, she had a first stage procedure on October 05, 2012, without acute complications.  She presents today for second- stage procedure.  PROCEDURE IN DETAIL:  The patient being Whitney Shelton, was verified, procedure being left second stage ureteroscopic stone manipulation was confirmed.  Procedure was carried out.  Time-out was performed. Intravenous antibiotics were administered.  General  LMA anesthesia was introduced.  The patient was placed into a low lithotomy position. Sterile field was created by prepping and draping the patient's vagina, introitus, and proximal thighs using iodine x3.  Next, cystourethroscopy was performed using a 22-French rigid cystoscope with 12-degrees offset lens.  Inspection of the urinary bladder revealed no diverticula, calcifications, papular lesions.  The distal end of the ureteral stent was seen in situ.  There was mild encrustation on this, this was grasped and brought to the level of the urethral meatus, through which, a 0.038 Glidewire was advanced slowly at the upper pole.  The stent was exchanged for a 6-French end-hole catheter and left retrograde pyelogram was seen.  Left retrograde pyelogram demonstrated a single left ureter and single system left kidney.  No filling defects or narrowing noted.  A Glidewire was once again readvanced at the level of the upper pole and set aside as a safety wire.  Next, the cystoscope was exchanged for the 6.5-French semi-rigid ureteroscope using normal saline irrigation alongside of a separate 8-French Foley catheter per urethra into the urinary bladder for pressure release.  Semi-rigid ureteroscopy was performed at the distal two-thirds of the ureter.  One area of residual calcification was noted in the proximal   third.  This was grasped with an escape-type basket and brought out to the level of the urinary bladder.  A Sensor wire was then advanced as a working wire and the semi-rigid ureteroscope was exchanged for the 12/14 35-cm [employer] ureteral access sheath, this was placed in a continuous fluoroscopy to the level of the proximal third of the ureter.  Next, flexible ureteroscopy was performed of the proximal ureter as well as systematic inspection of each calix using a 6-French flexible ureteroscope.  There were several small areas of residual calcification noted.  These appeared to be  amenable to simple basketing and escape-type basket was used to grasp these fragments and removed them in total.  Repeat pyelogram was performed, no extravasation of contrast was seen.  Repeat inspection of each calix based on comparison to pyelography was performed and no residual calcifications were encountered whatsoever.  The sheath was removed under continuous ureteroscopy.  No mucosal abnormalities or calcifications were noted.  Finally, a new 6 x 24 double-J stent was placed over the remaining safety wire using cystoscopic and fluoroscopic guidance.  Good proximal and distal curl were noted.  The efflux of urine was seen around and through the distal end of the stent.  Bladder was emptied per cystoscope along with several small previously noted ureteral calcifications.  The procedure was then terminated.  The patient tolerated the procedure well.  There were no immediate periprocedural complications.  The patient was taken to the postanesthesia care unit in excellent condition.          ______________________________ Camil Hausmann, MD     TM/MEDQ  D:  10/26/2012  T:  10/26/2012  Job:  [id number] 

## 2012-10-27 NOTE — Op Note (Signed)
NAME:  Whitney Shelton, Whitney Shelton NO.:  [id number]  MEDICAL RECORD NO.:  [id number]  LOCATION:                                 FACILITY:  PHYSICIAN:  Sebastian Ache, MD     DATE OF BIRTH:  1966-03-02  DATE OF PROCEDURE: DATE OF DISCHARGE:  10/05/2012                              OPERATIVE REPORT   DIAGNOSIS:  Residual left renal and ureteral stones.  PROCEDURES: 1. Left second stage ureteroscopic stone manipulation with basketing     of stones. 2. Left retrograde pyelogram interpretation. 3. Cystoscopy with left ureteral stent change 6 x 24, no tether.  COMPLICATIONS:  None.  SPECIMENS:  Small ureteral and renal stone fragments for discard.  FINDINGS: 1. Small volume residual left proximal ureteral and left lower pole     renal stones, total volume less than 1 cm2.  These were entirely     removed. 2. Otherwise unremarkable left retrograde pyelogram.  ESTIMATED BLOOD LOSS:  Nil.  DRAINS:  None.  INDICATION:  Whitney Shelton is a pleasant 46 year old female with episode of left renal colic last month.  During evaluation, was found to have large volume of left intrarenal stones.  She underwent stenting initially for pain control with plan for shockwave lithotripsy.  However, followup imaging revealed that the stone is very low density and not able to be targeted for shockwave as such additional options including percutaneous approach versus staged ureteroscopy were discussed.  She wished to proceed with the latter.  Informed consent was obtained and placed in the medical record.  Notably, she had a first stage procedure on October 05, 2012, without acute complications.  She presents today for second- stage procedure.  PROCEDURE IN DETAIL:  The patient being Whitney Shelton, was verified, procedure being left second stage ureteroscopic stone manipulation was confirmed.  Procedure was carried out.  Time-out was performed. Intravenous antibiotics were administered.  General  LMA anesthesia was introduced.  The patient was placed into a low lithotomy position. Sterile field was created by prepping and draping the patient's vagina, introitus, and proximal thighs using iodine x3.  Next, cystourethroscopy was performed using a 22-French rigid cystoscope with 12-degrees offset lens.  Inspection of the urinary bladder revealed no diverticula, calcifications, papular lesions.  The distal end of the ureteral stent was seen in situ.  There was mild encrustation on this, this was grasped and brought to the level of the urethral meatus, through which, a 0.038 Glidewire was advanced slowly at the upper pole.  The stent was exchanged for a 6-French end-hole catheter and left retrograde pyelogram was seen.  Left retrograde pyelogram demonstrated a single left ureter and single system left kidney.  No filling defects or narrowing noted.  A Glidewire was once again readvanced at the level of the upper pole and set aside as a safety wire.  Next, the cystoscope was exchanged for the 6.5-French semi-rigid ureteroscope using normal saline irrigation alongside of a separate 8-French Foley catheter per urethra into the urinary bladder for pressure release.  Semi-rigid ureteroscopy was performed at the distal two-thirds of the ureter.  One area of residual calcification was noted in the proximal  third.  This was grasped with an escape-type basket and brought out to the level of the urinary bladder.  A Sensor wire was then advanced as a working wire and the semi-rigid ureteroscope was exchanged for the 12/14 35-cm [employer] ureteral access sheath, this was placed in a continuous fluoroscopy to the level of the proximal third of the ureter.  Next, flexible ureteroscopy was performed of the proximal ureter as well as systematic inspection of each calix using a 6-French flexible ureteroscope.  There were several small areas of residual calcification noted.  These appeared to be  amenable to simple basketing and escape-type basket was used to grasp these fragments and removed them in total.  Repeat pyelogram was performed, no extravasation of contrast was seen.  Repeat inspection of each calix based on comparison to pyelography was performed and no residual calcifications were encountered whatsoever.  The sheath was removed under continuous ureteroscopy.  No mucosal abnormalities or calcifications were noted.  Finally, a new 6 x 24 double-J stent was placed over the remaining safety wire using cystoscopic and fluoroscopic guidance.  Good proximal and distal curl were noted.  The efflux of urine was seen around and through the distal end of the stent.  Bladder was emptied per cystoscope along with several small previously noted ureteral calcifications.  The procedure was then terminated.  The patient tolerated the procedure well.  There were no immediate periprocedural complications.  The patient was taken to the postanesthesia care unit in excellent condition.          ______________________________ Sebastian Ache, MD     TM/MEDQ  D:  10/26/2012  T:  10/26/2012  Job:  [id number]

## 2012-10-28 MED ORDER — SODIUM CHLORIDE 0.9 % IR SOLN
Status: DC | PRN
  Administered 2012-10-28: 6000 mL via INTRAVESICAL

## 2012-10-28 MED ORDER — IOHEXOL 300 MG/ML  SOLN
INTRAMUSCULAR | Status: DC | PRN
  Administered 2012-10-28: 5 mL

## 2012-10-28 NOTE — OR Nursing (Signed)
10/28/12 OR Nursing addendum- hard copy of chart completed at time of surgery. Information entered into EPIC after computer issues resolved.

## 2012-10-31 ENCOUNTER — Encounter (HOSPITAL_BASED_OUTPATIENT_CLINIC_OR_DEPARTMENT_OTHER): Payer: Self-pay | Admitting: Urology

## 2012-11-23 NOTE — OR Nursing (Signed)
11/23/2012 Addendum - changed date for surgery. Rollene Rotunda, RN

## 2015-10-01 ENCOUNTER — Encounter (HOSPITAL_BASED_OUTPATIENT_CLINIC_OR_DEPARTMENT_OTHER): Payer: Self-pay | Admitting: Emergency Medicine

## 2015-10-01 ENCOUNTER — Emergency Department (HOSPITAL_BASED_OUTPATIENT_CLINIC_OR_DEPARTMENT_OTHER): Payer: BLUE CROSS/BLUE SHIELD

## 2015-10-01 ENCOUNTER — Emergency Department (HOSPITAL_BASED_OUTPATIENT_CLINIC_OR_DEPARTMENT_OTHER)
Admission: EM | Admit: 2015-10-01 | Discharge: 2015-10-01 | Disposition: A | Discharge status: Home / Self Care | Payer: BLUE CROSS/BLUE SHIELD | Attending: Emergency Medicine | Admitting: Emergency Medicine

## 2015-10-01 DIAGNOSIS — R519 Headache, unspecified: Secondary | ICD-10-CM

## 2015-10-01 DIAGNOSIS — Z79899 Other long term (current) drug therapy: Secondary | ICD-10-CM | POA: Diagnosis not present

## 2015-10-01 DIAGNOSIS — Z86018 Personal history of other benign neoplasm: Secondary | ICD-10-CM | POA: Diagnosis not present

## 2015-10-01 DIAGNOSIS — R51 Headache: Secondary | ICD-10-CM | POA: Diagnosis present

## 2015-10-01 DIAGNOSIS — J45909 Unspecified asthma, uncomplicated: Secondary | ICD-10-CM | POA: Insufficient documentation

## 2015-10-01 DIAGNOSIS — Z8679 Personal history of other diseases of the circulatory system: Secondary | ICD-10-CM | POA: Diagnosis not present

## 2015-10-01 DIAGNOSIS — Z87442 Personal history of urinary calculi: Secondary | ICD-10-CM | POA: Diagnosis not present

## 2015-10-01 MED ORDER — SODIUM CHLORIDE 0.9 % IV BOLUS (SEPSIS)
1000.0000 mL | Freq: Once | INTRAVENOUS | Status: AC
  Administered 2015-10-01: 1000 mL via INTRAVENOUS

## 2015-10-01 MED ORDER — KETOROLAC TROMETHAMINE 30 MG/ML IJ SOLN
30.0000 mg | Freq: Once | INTRAMUSCULAR | Status: AC
  Administered 2015-10-01: 30 mg via INTRAVENOUS
  Filled 2015-10-01: qty 1

## 2015-10-01 MED ORDER — IBUPROFEN 400 MG PO TABS: 400.0000 mg | ORAL_TABLET | Freq: Four times a day (QID) | ORAL | Status: DC | PRN

## 2015-10-01 MED ORDER — DIPHENHYDRAMINE HCL 50 MG/ML IJ SOLN
25.0000 mg | Freq: Once | INTRAMUSCULAR | Status: AC
  Administered 2015-10-01: 25 mg via INTRAVENOUS
  Filled 2015-10-01: qty 1

## 2015-10-01 MED ORDER — DEXAMETHASONE SODIUM PHOSPHATE 10 MG/ML IJ SOLN
10.0000 mg | Freq: Once | INTRAMUSCULAR | Status: AC
  Administered 2015-10-01: 10 mg via INTRAVENOUS
  Filled 2015-10-01: qty 1

## 2015-10-01 MED ORDER — METOCLOPRAMIDE HCL 5 MG/ML IJ SOLN
10.0000 mg | Freq: Once | INTRAMUSCULAR | Status: AC
  Administered 2015-10-01: 10 mg via INTRAVENOUS
  Filled 2015-10-01: qty 2

## 2015-10-01 NOTE — ED Provider Notes (Addendum)
CSN: [id number]     Arrival date & time 10/01/15  0048 History   First MD Initiated Contact with Patient 10/01/15 0100     Chief Complaint  Patient presents with  . Headache     (Consider location/radiation/quality/duration/timing/severity/associated sxs/prior Treatment) HPI Comments: SUBJECTIVE:  Whitney Shelton is a 49 y.o. female who complains of migraine headache for 1 month(s). She has a well established history of recurrent migraines, but reports that the current headaches are different that her usual migraines. She takes excedrin for headaches. She saw a headache specialist today, and was started on baclofen and zonegran.   Description of pain: throbbing pain, diffuse location - including center, sharp pain, radiates to neck. Associated symptoms: visual disturbance - seeing flashes. Patient has already taken the home meds for this headache without relief.  No vomiting, seizures, altered mental status, loss of consciousness, new weakness, or numbness, gait instability. Pt does indicate that her headaches are worse when she is laying down, and maybe even when she wakes up.   No current facility-administered medications for this encounter. Current Outpatient Prescriptions: baclofen (LIORESAL) 10 MG tablet, Take 10 mg by mouth 2 (two) times daily., Disp: , Rfl:  zonisamide (ZONEGRAN) 100 MG capsule, Take 100 mg by mouth at bedtime as needed., Disp: , Rfl:  acetaminophen (TYLENOL) 325 MG tablet, Take 650 mg by mouth every 6 (six) hours as needed., Disp: , Rfl:  HYDROmorphone (DILAUDID) 2 MG tablet, Take 1 tablet (2 mg total) by mouth every 4 (four) hours as needed., Disp: 30 tablet, Rfl: 0 ibuprofen (ADVIL,MOTRIN) 200 MG tablet, Take 200 mg by mouth every 6 (six) hours as needed., Disp: , Rfl:  NARATRIPTAN HCL PO, Take by mouth as needed., Disp: , Rfl:  sennosides-docusate sodium (SENOKOT-S) 8.6-50 MG tablet, Take 1 tablet by mouth 2 (two) times daily. Wile taking pain meds to prevent  contipation, Disp: 30 tablet, Rfl: 0 solifenacin (VESICARE) 10 MG tablet, Take 10 mg by mouth daily., Disp: , Rfl:  Tamsulosin HCl (FLOMAX) 0.4 MG CAPS, Take 1 capsule (0.4 mg total) by mouth daily. For stent discomfort and to help pass stone fragments, Disp: 30 capsule, Rfl: 0    There are no associated abnormal neurological symptoms such as TIA's, loss of balance, loss of vision or speech, numbness or weakness on review. Past neurological history: negative for stroke, MS, epilepsy, or brain tumor.   OBJECTIVE:  Patient appears in pain, preferring to lie in a darkened room. Her vitals are normal. Alert and oriented x 3.  Ears and throat normal. Neck fully supple without nodes. Sinuses non tender. Cranial nerves are normal.  Fundi are normal with sharp disc margins, no papilledema, hemorrhages or exudates noted. DTR's normal and symmetric. Babinski sign absent.  Mental status normal. Cerebellar function normal.   ASSESSMENT:  Migraine headache  But needs CT head to r/o tumors. Will give meds for the headache post CT, and reassess. Pt has an appt with her neurologist tomorrow for some injections.  PLAN:  Treatment today -  see orders as documented in the electronic medical record. ROV prn if pain does not resolve after treatment.   Patient is a 49 y.o. female presenting with headaches. The history is provided by the patient.  Headache   Past Medical History  Diagnosis Date  . Migraine   . Fibroids   . Unspecified asthma(493.90) HX BRONCHIAL ASTHMA  --- LAST BOUT OF BRONCHITIS LAST WINTER  2012  . Frequency of urination   . Urgency  of urination   . Nocturia   . Renal calculus or stone LEFT SIDE M X1  NON-OBSTRUCTIVE  . Complication of anesthesia HARD TO WAKE   Past Surgical History  Procedure Laterality Date  . Cystoscopy w/ ureteral stent placement  09/22/2012    Procedure: CYSTOSCOPY WITH RETROGRADE PYELOGRAM/URETERAL STENT PLACEMENT;  Surgeon: Reece Packer, MD;  Location:  [facility];  Service: Urology;  Laterality: Left;  . Endometrial ablation w/ novasure  04-25-2004  . Hysteroscopy with resectoscope  01-04-2004    RESECTION FIBROID  . Wisdom tooth extraction  AGE 70    GEN. ANES.  Marland Kitchen Ureteroscopy  10/05/2012    Procedure: URETEROSCOPY;  Surgeon: Alexis Frock, MD;  Location: [facility];  Service: Urology;  Laterality: Left;  . Cystoscopy w/ ureteral stent removal  10/05/2012    Procedure: CYSTOSCOPY WITH STENT REMOVAL;  Surgeon: Alexis Frock, MD;  Location: [facility];  Service: Urology;;  . Cystoscopy with retrograde pyelogram, ureteroscopy and stent placement  10/26/2012    Procedure: CYSTOSCOPY WITH RETROGRADE PYELOGRAM, URETEROSCOPY AND STENT PLACEMENT;  Surgeon: Alexis Frock, MD;  Location: [facility];  Service: Urology;  Laterality: Left;   History reviewed. No pertinent family history. Social History  Substance Use Topics  . Smoking status: Never Smoker   . Smokeless tobacco: None  . Alcohol Use: No   OB History    No data available     Review of Systems  Neurological: Positive for headaches.  All other systems reviewed and are negative.     Allergies  Vicodin and Percocet  Home Medications   Prior to Admission medications   Medication Sig Start Date End Date Taking? Authorizing Provider  baclofen (LIORESAL) 10 MG tablet Take 10 mg by mouth 2 (two) times daily.   Yes Historical Provider, MD  zonisamide (ZONEGRAN) 100 MG capsule Take 100 mg by mouth at bedtime as needed.   Yes Historical Provider, MD  acetaminophen (TYLENOL) 325 MG tablet Take 650 mg by mouth every 6 (six) hours as needed.    Historical Provider, MD  HYDROmorphone (DILAUDID) 2 MG tablet Take 1 tablet (2 mg total) by mouth every 4 (four) hours as needed. 10/26/12   Alexis Frock, MD  ibuprofen (ADVIL,MOTRIN) 400 MG tablet Take 1 tablet (400 mg total) by mouth every 6 (six) hours as needed. 10/01/15   Varney Biles, MD   NARATRIPTAN HCL PO Take by mouth as needed.    Historical Provider, MD  sennosides-docusate sodium (SENOKOT-S) 8.6-50 MG tablet Take 1 tablet by mouth 2 (two) times daily. Wile taking pain meds to prevent contipation 10/05/12   Alexis Frock, MD  solifenacin (VESICARE) 10 MG tablet Take 10 mg by mouth daily.    Historical Provider, MD  Tamsulosin HCl (FLOMAX) 0.4 MG CAPS Take 1 capsule (0.4 mg total) by mouth daily. For stent discomfort and to help pass stone fragments 10/05/12   Alexis Frock, MD   BP 185/96 mmHg  Pulse 88  Temp(Src) 98.4 F (36.9 C) (Oral)  Resp 18  Ht 5\' 6"  (1.676 m)  Wt 200 lb (90.719 kg)  BMI 32.30 kg/m2  SpO2 100%  LMP 09/21/2015 Physical Exam  Constitutional: She is oriented to person, place, and time. She appears well-developed.  HENT:  Head: Normocephalic and atraumatic.  Eyes: EOM are normal.  Neck: Normal range of motion. Neck supple.  No meningismus  Cardiovascular: Normal rate.   Pulmonary/Chest: Effort normal.  Abdominal: Bowel sounds are normal.  Musculoskeletal:  Normal range of motion.  Neurological: She is alert and oriented to person, place, and time.  Cerebellar exam is normal (finger to nose) Sensory exam normal for bilateral upper and lower extremities - and patient is able to discriminate between sharp and dull. Motor exam is 4+/5   Skin: Skin is warm and dry.  Nursing note and vitals reviewed.   ED Course  Procedures (including critical care time) Labs Review Labs Reviewed - No data to display  Imaging Review Ct Head Wo Contrast  10/01/2015   CLINICAL DATA:  Headaches for about a month. Patient cannot lay down without having constant shooting pains. [facility] and was put on 2 medications.  EXAM: CT HEAD WITHOUT CONTRAST  TECHNIQUE: Contiguous axial images were obtained from the base of the skull through the vertex without intravenous contrast.  COMPARISON:  None.  FINDINGS: Ventricles and sulci appear  symmetrical. No ventricular dilatation. No mass effect or midline shift. No abnormal extra-axial fluid collections. Gray-white matter junctions are distinct. Basal cisterns are not effaced. No evidence of acute intracranial hemorrhage. No depressed skull fractures. Visualized paranasal sinuses and mastoid air cells are not opacified.  IMPRESSION: No acute intracranial abnormalities.   Electronically Signed   By: Lucienne Capers [name]   On: 10/01/2015 04:09   I have personally reviewed and evaluated these images and lab results as part of my medical decision-making.   EKG Interpretation None       @5 :00 CT results discussed, pain meds ordered. @6 :00: Upon reassessment, patient reports that the headache has essentially resolved. She is resting comfortably. She continued to have no neurologic complains. Strict return precautions discussed, pt will return to the ER if there is visual complains, seizures, altered mental status, loss of consciousness, dizziness, new focal weakness, or numbness.    MDM   Final diagnoses:  Acute nonintractable headache, unspecified headache type    DDX includes: Primary headaches - including migrainous headaches, cluster headaches, tension headaches. ICH Carotid dissection Cavernous sinus thrombosis Meningitis Encephalitis Sinusitis Tumor Vascular headaches AV malformation Brain aneurysm Muscular headaches  A/P: Pt comes in with cc of headaches. Ct head ordered as the headaches have been present for a month in a 49 y/o that are different that her typical migraine h/a. She is seeing neurologist, so if the CT head is normal, we will give her headache meds and have her continue care with the neurologist, who appears to have started her on new meeds today and intends to do nerve block tomorrow.   Varney Biles, MD 10/01/15 0347  Varney Biles, MD 10/01/15 [phone]

## 2015-10-01 NOTE — ED Notes (Signed)
Patient states that she has had a HA for about a month. The patient reports that she went to a [facility] today and was put on two medications  - the patient reports that she has taken them. Patient reports that she can not lay down without having constant shooting pains

## 2015-10-01 NOTE — Discharge Instructions (Signed)
We saw you in the ER for headaches. CT is negative for any tumors or bleed. We are not sure what is causing your headaches, however, there appears to be no evidence of infection, bleeds or tumors based on our exam and results.  Please take motrin round the clock for the next 6 hours, and take other meds prescribed only for break through pain. See your doctor if the pain persists, as you might need better medications or a specialist.   Migraine Headache A migraine headache is an intense, throbbing pain on one or both sides of your head. A migraine can last for 30 minutes to several hours. CAUSES  The exact cause of a migraine headache is not always known. However, a migraine may be caused when nerves in the brain become irritated and release chemicals that cause inflammation. This causes pain. Certain things may also trigger migraines, such as:  Alcohol.  Smoking.  Stress.  Menstruation.  Aged cheeses.  Foods or drinks that contain nitrates, glutamate, aspartame, or tyramine.  Lack of sleep.  Chocolate.  Caffeine.  Hunger.  Physical exertion.  Fatigue.  Medicines used to treat chest pain (nitroglycerine), birth control pills, estrogen, and some blood pressure medicines. SIGNS AND SYMPTOMS  Pain on one or both sides of your head.  Pulsating or throbbing pain.  Severe pain that prevents daily activities.  Pain that is aggravated by any physical activity.  Nausea, vomiting, or both.  Dizziness.  Pain with exposure to bright lights, loud noises, or activity.  General sensitivity to bright lights, loud noises, or smells. Before you get a migraine, you may get warning signs that a migraine is coming (aura). An aura may include:  Seeing flashing lights.  Seeing bright spots, halos, or zigzag lines.  Having tunnel vision or blurred vision.  Having feelings of numbness or tingling.  Having trouble talking.  Having muscle weakness. DIAGNOSIS  A migraine  headache is often diagnosed based on:  Symptoms.  Physical exam.  A CT scan or MRI of your head. These imaging tests cannot diagnose migraines, but they can help rule out other causes of headaches. TREATMENT Medicines may be given for pain and nausea. Medicines can also be given to help prevent recurrent migraines.  HOME CARE INSTRUCTIONS  Only take over-the-counter or prescription medicines for pain or discomfort as directed by your health care provider. The use of long-term narcotics is not recommended.  Lie down in a dark, quiet room when you have a migraine.  Keep a journal to find out what may trigger your migraine headaches. For example, write down:  What you eat and drink.  How much sleep you get.  Any change to your diet or medicines.  Limit alcohol consumption.  Quit smoking if you smoke.  Get 7-9 hours of sleep, or as recommended by your health care provider.  Limit stress.  Keep lights dim if bright lights bother you and make your migraines worse. SEEK IMMEDIATE MEDICAL CARE IF:   Your migraine becomes severe.  You have a fever.  You have a stiff neck.  You have vision loss.  You have muscular weakness or loss of muscle control.  You start losing your balance or have trouble walking.  You feel faint or pass out.  You have severe symptoms that are different from your first symptoms. MAKE SURE YOU:   Understand these instructions.  Will watch your condition.  Will get help right away if you are not doing well or get worse.  This information is not intended to replace advice given to you by your health care provider. Make sure you discuss any questions you have with your health care provider.   Document Released: 12/07/2005 Document Revised: 12/28/2014 Document Reviewed: 08/14/2013 Elsevier Interactive Patient Education [employer].

## 2015-10-29 ENCOUNTER — Other Ambulatory Visit: Payer: Self-pay | Admitting: Obstetrics and Gynecology

## 2015-10-29 DIAGNOSIS — R928 Other abnormal and inconclusive findings on diagnostic imaging of breast: Secondary | ICD-10-CM

## 2015-10-30 ENCOUNTER — Other Ambulatory Visit: Payer: Self-pay | Admitting: Obstetrics and Gynecology

## 2015-11-04 ENCOUNTER — Ambulatory Visit
Admission: RE | Admit: 2015-11-04 | Discharge: 2015-11-04 | Disposition: A | Discharge status: Home / Self Care | Payer: BLUE CROSS/BLUE SHIELD | Source: Ambulatory Visit | Attending: Obstetrics and Gynecology | Admitting: Obstetrics and Gynecology

## 2015-11-04 DIAGNOSIS — R928 Other abnormal and inconclusive findings on diagnostic imaging of breast: Secondary | ICD-10-CM

## 2016-12-11 ENCOUNTER — Other Ambulatory Visit: Payer: Self-pay | Admitting: Obstetrics and Gynecology

## 2016-12-11 DIAGNOSIS — R921 Mammographic calcification found on diagnostic imaging of breast: Secondary | ICD-10-CM

## 2016-12-23 ENCOUNTER — Ambulatory Visit
Admission: RE | Admit: 2016-12-23 | Discharge: 2016-12-23 | Disposition: A | Discharge status: Home / Self Care | Payer: 59 | Source: Ambulatory Visit | Attending: Obstetrics and Gynecology | Admitting: Obstetrics and Gynecology

## 2016-12-23 DIAGNOSIS — R921 Mammographic calcification found on diagnostic imaging of breast: Secondary | ICD-10-CM

## 2017-07-28 ENCOUNTER — Encounter (HOSPITAL_COMMUNITY): Payer: Self-pay

## 2017-07-28 ENCOUNTER — Observation Stay (HOSPITAL_COMMUNITY)
Admission: EM | Admit: 2017-07-28 | Discharge: 2017-07-28 | Disposition: A | Discharge status: Home / Self Care | Payer: 59 | Attending: Urology | Admitting: Urology

## 2017-07-28 ENCOUNTER — Observation Stay (HOSPITAL_COMMUNITY): Payer: 59

## 2017-07-28 ENCOUNTER — Emergency Department (HOSPITAL_COMMUNITY): Payer: 59

## 2017-07-28 ENCOUNTER — Encounter (HOSPITAL_COMMUNITY)
Admission: EM | Disposition: A | Discharge status: Home / Self Care | Payer: Self-pay | Source: Home / Self Care | Attending: Emergency Medicine

## 2017-07-28 ENCOUNTER — Emergency Department (HOSPITAL_COMMUNITY): Payer: 59 | Admitting: Anesthesiology

## 2017-07-28 DIAGNOSIS — Z79899 Other long term (current) drug therapy: Secondary | ICD-10-CM | POA: Insufficient documentation

## 2017-07-28 DIAGNOSIS — N132 Hydronephrosis with renal and ureteral calculous obstruction: Secondary | ICD-10-CM | POA: Diagnosis not present

## 2017-07-28 DIAGNOSIS — I10 Essential (primary) hypertension: Secondary | ICD-10-CM | POA: Diagnosis not present

## 2017-07-28 DIAGNOSIS — Z87442 Personal history of urinary calculi: Secondary | ICD-10-CM | POA: Diagnosis not present

## 2017-07-28 DIAGNOSIS — N23 Unspecified renal colic: Secondary | ICD-10-CM

## 2017-07-28 DIAGNOSIS — N200 Calculus of kidney: Secondary | ICD-10-CM

## 2017-07-28 HISTORY — PX: CYSTOSCOPY WITH STENT PLACEMENT: SHX5790

## 2017-07-28 LAB — URINALYSIS, ROUTINE W REFLEX MICROSCOPIC
BILIRUBIN URINE: NEGATIVE
Glucose, UA: 50 mg/dL — AB
KETONES UR: 5 mg/dL — AB
LEUKOCYTES UA: NEGATIVE
NITRITE: NEGATIVE
PROTEIN: 30 mg/dL — AB
SPECIFIC GRAVITY, URINE: 1.013 (ref 1.005–1.030)
pH: 5 (ref 5.0–8.0)

## 2017-07-28 LAB — PREGNANCY, URINE: PREG TEST UR: NEGATIVE

## 2017-07-28 LAB — I-STAT CHEM 8, ED
BUN: 22 mg/dL — ABNORMAL HIGH (ref 6–20)
CALCIUM ION: 1.2 mmol/L (ref 1.15–1.40)
CHLORIDE: 105 mmol/L (ref 101–111)
Creatinine, Ser: 0.9 mg/dL (ref 0.44–1.00)
GLUCOSE: 213 mg/dL — AB (ref 65–99)
HCT: 43 % (ref 36.0–46.0)
Hemoglobin: 14.6 g/dL (ref 12.0–15.0)
Potassium: 4.3 mmol/L (ref 3.5–5.1)
Sodium: 138 mmol/L (ref 135–145)
TCO2: 22 mmol/L (ref 0–100)

## 2017-07-28 LAB — CBC
HCT: 41.1 % (ref 36.0–46.0)
Hemoglobin: 13.6 g/dL (ref 12.0–15.0)
MCH: 29 pg (ref 26.0–34.0)
MCHC: 33.1 g/dL (ref 30.0–36.0)
MCV: 87.6 fL (ref 78.0–100.0)
PLATELETS: 326 10*3/uL (ref 150–400)
RBC: 4.69 MIL/uL (ref 3.87–5.11)
RDW: 13.6 % (ref 11.5–15.5)
WBC: 19.2 10*3/uL — AB (ref 4.0–10.5)

## 2017-07-28 SURGERY — CYSTOSCOPY, WITH STENT INSERTION
Anesthesia: General | Laterality: Left

## 2017-07-28 MED ORDER — SODIUM CHLORIDE 0.9 % IV SOLN: 250.0000 mL | INTRAVENOUS | Status: DC | PRN

## 2017-07-28 MED ORDER — FENTANYL CITRATE (PF) 100 MCG/2ML IJ SOLN: 25.0000 ug | INTRAMUSCULAR | Status: DC | PRN

## 2017-07-28 MED ORDER — HYDROMORPHONE HCL 1 MG/ML IJ SOLN
0.5000 mg | Freq: Once | INTRAMUSCULAR | Status: AC
  Administered 2017-07-28: 0.5 mg via INTRAVENOUS
  Filled 2017-07-28: qty 1

## 2017-07-28 MED ORDER — LIDOCAINE 2% (20 MG/ML) 5 ML SYRINGE
INTRAMUSCULAR | Status: DC | PRN
  Administered 2017-07-28: 60 mg via INTRAVENOUS

## 2017-07-28 MED ORDER — ACETAMINOPHEN 650 MG RE SUPP
650.0000 mg | RECTAL | Status: DC | PRN
  Filled 2017-07-28: qty 1

## 2017-07-28 MED ORDER — HYDRALAZINE HCL 20 MG/ML IJ SOLN
5.0000 mg | Freq: Once | INTRAMUSCULAR | Status: AC
  Administered 2017-07-28: 5 mg via INTRAVENOUS
  Filled 2017-07-28: qty 1

## 2017-07-28 MED ORDER — PROMETHAZINE HCL 25 MG/ML IJ SOLN: 6.2500 mg | INTRAMUSCULAR | Status: DC | PRN

## 2017-07-28 MED ORDER — SODIUM CHLORIDE 0.9 % IR SOLN
Status: DC | PRN
  Administered 2017-07-28: 3000 mL

## 2017-07-28 MED ORDER — KETOROLAC TROMETHAMINE 30 MG/ML IJ SOLN
30.0000 mg | Freq: Once | INTRAMUSCULAR | Status: AC
  Administered 2017-07-28: 30 mg via INTRAVENOUS
  Filled 2017-07-28: qty 1

## 2017-07-28 MED ORDER — OXYBUTYNIN CHLORIDE 5 MG PO TABS: 5.0000 mg | ORAL_TABLET | Freq: Three times a day (TID) | ORAL | 1 refills | Status: DC | PRN

## 2017-07-28 MED ORDER — SODIUM CHLORIDE 0.9% FLUSH: 3.0000 mL | Freq: Two times a day (BID) | INTRAVENOUS | Status: DC

## 2017-07-28 MED ORDER — ONDANSETRON HCL 4 MG/2ML IJ SOLN
4.0000 mg | Freq: Once | INTRAMUSCULAR | Status: AC
Start: 2017-07-28 — End: 2017-07-28
  Administered 2017-07-28: 4 mg via INTRAVENOUS
  Filled 2017-07-28: qty 2

## 2017-07-28 MED ORDER — PROPOFOL 10 MG/ML IV BOLUS
INTRAVENOUS | Status: AC
  Filled 2017-07-28: qty 20

## 2017-07-28 MED ORDER — CEFAZOLIN SODIUM-DEXTROSE 2-4 GM/100ML-% IV SOLN
2.0000 g | Freq: Once | INTRAVENOUS | Status: AC
  Administered 2017-07-28: 2 g via INTRAVENOUS

## 2017-07-28 MED ORDER — LACTATED RINGERS IV SOLN
INTRAVENOUS | Status: DC | PRN
  Administered 2017-07-28: 13:00:00 via INTRAVENOUS

## 2017-07-28 MED ORDER — FENTANYL CITRATE (PF) 100 MCG/2ML IJ SOLN
INTRAMUSCULAR | Status: AC
  Filled 2017-07-28: qty 2

## 2017-07-28 MED ORDER — ONDANSETRON HCL 4 MG/2ML IJ SOLN
INTRAMUSCULAR | Status: DC | PRN
  Administered 2017-07-28: 4 mg via INTRAVENOUS

## 2017-07-28 MED ORDER — SODIUM CHLORIDE 0.9% FLUSH: 3.0000 mL | INTRAVENOUS | Status: DC | PRN

## 2017-07-28 MED ORDER — 0.9 % SODIUM CHLORIDE (POUR BTL) OPTIME
TOPICAL | Status: DC | PRN
  Administered 2017-07-28: 1000 mL

## 2017-07-28 MED ORDER — MIDAZOLAM HCL 2 MG/2ML IJ SOLN
INTRAMUSCULAR | Status: DC | PRN
  Administered 2017-07-28: 2 mg via INTRAVENOUS

## 2017-07-28 MED ORDER — DEXAMETHASONE SODIUM PHOSPHATE 10 MG/ML IJ SOLN
INTRAMUSCULAR | Status: DC | PRN
  Administered 2017-07-28: 10 mg via INTRAVENOUS

## 2017-07-28 MED ORDER — ACETAMINOPHEN 325 MG PO TABS: 650.0000 mg | ORAL_TABLET | ORAL | Status: DC | PRN

## 2017-07-28 MED ORDER — SODIUM CHLORIDE 0.9 % IV BOLUS (SEPSIS)
1000.0000 mL | Freq: Once | INTRAVENOUS | Status: AC
  Administered 2017-07-28: 1000 mL via INTRAVENOUS

## 2017-07-28 MED ORDER — PROMETHAZINE HCL 25 MG/ML IJ SOLN
12.5000 mg | INTRAMUSCULAR | Status: DC | PRN
  Administered 2017-07-28: 12.5 mg via INTRAVENOUS
  Filled 2017-07-28: qty 1

## 2017-07-28 MED ORDER — CEFAZOLIN SODIUM-DEXTROSE 2-4 GM/100ML-% IV SOLN
INTRAVENOUS | Status: AC
  Filled 2017-07-28: qty 100

## 2017-07-28 MED ORDER — HYDROMORPHONE HCL 2 MG PO TABS: 1.0000 mg | ORAL_TABLET | Freq: Four times a day (QID) | ORAL | 0 refills | Status: DC | PRN

## 2017-07-28 MED ORDER — PROMETHAZINE HCL 25 MG PO TABS: 25.0000 mg | ORAL_TABLET | Freq: Four times a day (QID) | ORAL | 0 refills | Status: DC | PRN

## 2017-07-28 MED ORDER — IOHEXOL 300 MG/ML  SOLN
INTRAMUSCULAR | Status: DC | PRN
  Administered 2017-07-28: 10 mL

## 2017-07-28 MED ORDER — HYDROMORPHONE HCL 1 MG/ML IJ SOLN
1.0000 mg | INTRAMUSCULAR | Status: DC | PRN
  Administered 2017-07-28: 1 mg via INTRAVENOUS
  Filled 2017-07-28: qty 1

## 2017-07-28 MED ORDER — ONDANSETRON HCL 4 MG/2ML IJ SOLN
INTRAMUSCULAR | Status: AC
  Filled 2017-07-28: qty 2

## 2017-07-28 MED ORDER — TAMSULOSIN HCL 0.4 MG PO CAPS: 0.4000 mg | ORAL_CAPSULE | Freq: Every day | ORAL | 0 refills | Status: DC

## 2017-07-28 MED ORDER — MIDAZOLAM HCL 2 MG/2ML IJ SOLN
INTRAMUSCULAR | Status: AC
  Filled 2017-07-28: qty 2

## 2017-07-28 MED ORDER — FENTANYL CITRATE (PF) 100 MCG/2ML IJ SOLN
INTRAMUSCULAR | Status: DC | PRN
  Administered 2017-07-28: 50 ug via INTRAVENOUS

## 2017-07-28 MED ORDER — TRAMADOL HCL 50 MG PO TABS: 50.0000 mg | ORAL_TABLET | Freq: Four times a day (QID) | ORAL | 0 refills | Status: DC | PRN

## 2017-07-28 MED ORDER — DEXAMETHASONE SODIUM PHOSPHATE 10 MG/ML IJ SOLN
INTRAMUSCULAR | Status: AC
  Filled 2017-07-28: qty 1

## 2017-07-28 MED ORDER — OXYCODONE HCL 5 MG PO TABS: 5.0000 mg | ORAL_TABLET | ORAL | Status: DC | PRN

## 2017-07-28 MED ORDER — PROPOFOL 10 MG/ML IV BOLUS
INTRAVENOUS | Status: DC | PRN
  Administered 2017-07-28: 150 mg via INTRAVENOUS

## 2017-07-28 MED ORDER — KETOROLAC TROMETHAMINE 30 MG/ML IJ SOLN: 30.0000 mg | Freq: Once | INTRAMUSCULAR | Status: DC | PRN

## 2017-07-28 MED ORDER — HYDROMORPHONE HCL 1 MG/ML IJ SOLN
1.0000 mg | Freq: Once | INTRAMUSCULAR | Status: AC
  Administered 2017-07-28: 1 mg via INTRAVENOUS
  Filled 2017-07-28: qty 1

## 2017-07-28 MED ORDER — LIDOCAINE 2% (20 MG/ML) 5 ML SYRINGE
INTRAMUSCULAR | Status: AC
  Filled 2017-07-28: qty 5

## 2017-07-28 MED ORDER — HYDROMORPHONE HCL 2 MG PO TABS
1.0000 mg | ORAL_TABLET | Freq: Once | ORAL | Status: AC
  Administered 2017-07-28: 1 mg via ORAL
  Filled 2017-07-28: qty 1

## 2017-07-28 SURGICAL SUPPLY — 15 items
BAG URO CATCHER STRL LF (MISCELLANEOUS) ×3 IMPLANT
CATH INTERMIT  6FR 70CM (CATHETERS) ×3 IMPLANT
CLOTH BEACON ORANGE TIMEOUT ST (SAFETY) ×3 IMPLANT
COVER SURGICAL LIGHT HANDLE (MISCELLANEOUS) ×3 IMPLANT
GLOVE BIOGEL M 8.0 STRL (GLOVE) ×3 IMPLANT
GOWN STRL REUS W/ TWL XL LVL3 (GOWN DISPOSABLE) ×1 IMPLANT
GOWN STRL REUS W/TWL LRG LVL3 (GOWN DISPOSABLE) ×3 IMPLANT
GOWN STRL REUS W/TWL XL LVL3 (GOWN DISPOSABLE) ×2
GUIDEWIRE ANG ZIPWIRE 038X150 (WIRE) IMPLANT
GUIDEWIRE STR DUAL SENSOR (WIRE) ×3 IMPLANT
MANIFOLD NEPTUNE II (INSTRUMENTS) ×3 IMPLANT
PACK CYSTO (CUSTOM PROCEDURE TRAY) ×3 IMPLANT
STENT CONTOUR 6FRX24X.038 (STENTS) ×3 IMPLANT
TUBING CONNECTING 10 (TUBING) ×2 IMPLANT
TUBING CONNECTING 10' (TUBING) ×1

## 2017-07-28 NOTE — ED Notes (Signed)
Patient informed again about needing urine specimen. Patient stated, "I might be able to go in a few minutes."

## 2017-07-28 NOTE — Anesthesia Procedure Notes (Signed)
Procedure Name: LMA Insertion Date/Time: 07/28/2017 12:52 PM Performed by: Dione Booze Pre-anesthesia Checklist: Emergency Drugs available, Suction available, Patient being monitored and Patient identified Patient Re-evaluated:Patient Re-evaluated prior to induction Oxygen Delivery Method: Circle system utilized Preoxygenation: Pre-oxygenation with 100% oxygen Induction Type: IV induction LMA: LMA with gastric port inserted LMA Size: 4.0 Number of attempts: 1 Placement Confirmation: positive ETCO2 Tube secured with: Tape Dental Injury: Teeth and Oropharynx as per pre-operative assessment

## 2017-07-28 NOTE — ED Provider Notes (Signed)
[facility] DEPT Provider Note   CSN: [id number] Arrival date & time: 07/28/17  0434    History   Chief Complaint Chief Complaint  Patient presents with  . Flank Pain  . Hypertension    HPI Whitney Shelton is a 51 y.o. female.  51 year old female with history of kidney stones presents to the emergency department for evaluation of left-sided flank pain. Pain began at night and has been radiating to her left mid abdomen. She reports nausea without vomiting. Pain has been unrelieved by ibuprofen taken prior to arrival. Patient states that her pain feels similar to past kidney stones. She has not had any fevers, dysuria, hematuria, bowel changes. Prior stones did require cystoscopy 2 and stent placement. Patient previously followed by [facility]. She is not currently followed by a PCP or Urologist. No other hx of abdominal surgeries. No known hx of HTN.      Past Medical History:  Diagnosis Date  . Complication of anesthesia HARD TO WAKE  . Fibroids   . Frequency of urination   . Migraine   . Nocturia   . Renal calculus or stone LEFT SIDE M X1  NON-OBSTRUCTIVE  . Unspecified asthma(493.90) HX BRONCHIAL ASTHMA  --- LAST BOUT OF BRONCHITIS LAST WINTER  2012  . Urgency of urination     There are no active problems to display for this patient.   Past Surgical History:  Procedure Laterality Date  . CYSTOSCOPY W/ URETERAL STENT PLACEMENT  09/22/2012   Procedure: CYSTOSCOPY WITH RETROGRADE PYELOGRAM/URETERAL STENT PLACEMENT;  Surgeon: Reece Packer, MD;  Location: [facility];  Service: Urology;  Laterality: Left;  . CYSTOSCOPY W/ URETERAL STENT REMOVAL  10/05/2012   Procedure: CYSTOSCOPY WITH STENT REMOVAL;  Surgeon: Alexis Frock, MD;  Location: [facility];  Service: Urology;;  . [place], URETEROSCOPY AND STENT PLACEMENT  10/26/2012   Procedure: CYSTOSCOPY WITH RETROGRADE PYELOGRAM, URETEROSCOPY AND STENT PLACEMENT;  Surgeon:  Alexis Frock, MD;  Location: [facility];  Service: Urology;  Laterality: Left;  . ENDOMETRIAL ABLATION W/ NOVASURE  04-25-2004  . HYSTEROSCOPY WITH RESECTOSCOPE  01-04-2004   RESECTION FIBROID  . URETEROSCOPY  10/05/2012   Procedure: URETEROSCOPY;  Surgeon: Alexis Frock, MD;  Location: [facility];  Service: Urology;  Laterality: Left;  . WISDOM TOOTH EXTRACTION  AGE 89   GEN. ANES.    OB History    No data available       Home Medications    Prior to Admission medications   Medication Sig Start Date End Date Taking? Authorizing Provider  ibuprofen (ADVIL,MOTRIN) 200 MG tablet Take 600 mg by mouth every 6 (six) hours as needed for moderate pain.   Yes [provider]  HYDROmorphone (DILAUDID) 2 MG tablet Take 1 tablet (2 mg total) by mouth every 4 (four) hours as needed. Patient not taking: Reported on 07/28/2017 10/26/12   Alexis Frock, MD  ibuprofen (ADVIL,MOTRIN) 400 MG tablet Take 1 tablet (400 mg total) by mouth every 6 (six) hours as needed. Patient not taking: Reported on 07/28/2017 10/01/15   Varney Biles, MD  sennosides-docusate sodium (SENOKOT-S) 8.6-50 MG tablet Take 1 tablet by mouth 2 (two) times daily. Wile taking pain meds to prevent contipation Patient not taking: Reported on 07/28/2017 10/05/12   Alexis Frock, MD  Tamsulosin HCl (FLOMAX) 0.4 MG CAPS Take 1 capsule (0.4 mg total) by mouth daily. For stent discomfort and to help pass stone fragments Patient not taking: Reported on  07/28/2017 10/05/12   Alexis Frock, MD    Family History History reviewed. No pertinent family history.  Social History Social History  Substance Use Topics  . Smoking status: Never Smoker  . Smokeless tobacco: Not on file  . Alcohol use No     Allergies   Vicodin [hydrocodone-acetaminophen] and Percocet [oxycodone-acetaminophen]   Review of Systems Review of Systems Ten systems reviewed and are negative for acute change, except  as noted in the HPI.    Physical Exam Updated Vital Signs BP (!) 192/105 (BP Location: Right Arm)   Pulse 80   Temp 98.4 F (36.9 C) (Oral)   Resp 20   SpO2 98%   Physical Exam  Constitutional: She is oriented to person, place, and time. She appears well-developed and well-nourished. No distress.  Nontoxic and in NAD, but seems uncomfortable.  HENT:  Head: Normocephalic and atraumatic.  Eyes: Conjunctivae and EOM are normal. No scleral icterus.  Neck: Normal range of motion.  Cardiovascular: Normal rate, regular rhythm and intact distal pulses.   Pulmonary/Chest: Effort normal. No respiratory distress. She has no wheezes.  Respirations even and unlabored. Lungs clear.  Abdominal: Soft. She exhibits no mass. There is tenderness. There is no guarding.  Moderate left mid abdominal tenderness. No distension, guarding, or peritoneal signs. Abdomen soft.  Musculoskeletal: Normal range of motion.  Neurological: She is alert and oriented to person, place, and time. She exhibits normal muscle tone. Coordination normal.  GCS 15. Patient moving all extremities.  Skin: Skin is warm and dry. No rash noted. She is not diaphoretic. No erythema. No pallor.  Psychiatric: She has a normal mood and affect. Her behavior is normal.  Nursing note and vitals reviewed.    ED Treatments / Results  Labs (all labs ordered are listed, but only abnormal results are displayed) Labs Reviewed  URINALYSIS, ROUTINE W REFLEX MICROSCOPIC - Abnormal; Notable for the following:       Result Value   Color, Urine STRAW (*)    Glucose, UA 50 (*)    Hgb urine dipstick SMALL (*)    Ketones, ur 5 (*)    Protein, ur 30 (*)    Bacteria, UA RARE (*)    Squamous Epithelial / LPF 0-5 (*)    All other components within normal limits  I-STAT CHEM 8, ED - Abnormal; Notable for the following:    BUN 22 (*)    Glucose, Bld 213 (*)    All other components within normal limits  PREGNANCY, URINE  CBC    EKG  EKG  Interpretation None       Radiology Ct Renal Stone Study  Result Date: 07/28/2017 CLINICAL DATA:  Acute onset of left flank pain and microhematuria. Initial encounter. EXAM: CT ABDOMEN AND PELVIS WITHOUT CONTRAST TECHNIQUE: Multidetector CT imaging of the abdomen and pelvis was performed following the standard protocol without IV contrast. COMPARISON:  CT of the abdomen and pelvis from 09/21/2012 FINDINGS: Lower chest: The visualized lung bases are grossly clear. The visualized portions of the mediastinum are unremarkable. Hepatobiliary: Diffuse fatty infiltration is noted within the liver. The gallbladder is unremarkable in appearance. The common bile duct remains normal in caliber. Pancreas: The pancreas is within normal limits. Spleen: The spleen is unremarkable in appearance. Adrenals/Urinary Tract: The adrenal glands are unremarkable in appearance. Moderate left-sided hydronephrosis is noted, reflecting a large obstructing 1.4 x 1.2 cm stone at the left renal pelvis. Diffuse left-sided perinephric stranding and fluid is noted. No nonobstructing renal  stones are identified. A right renal cyst is noted. Stomach/Bowel: The stomach is unremarkable in appearance. The small bowel is within normal limits. The appendix is not visualized; there is no evidence for appendicitis. The colon is unremarkable in appearance. Vascular/Lymphatic: Minimal calcification is seen along the abdominal aorta. No retroperitoneal or pelvic sidewall lymphadenopathy is seen. Reproductive: The bladder is mildly distended and within normal limits. The uterus is grossly unremarkable in appearance. The ovaries are relatively symmetric. No suspicious adnexal masses are seen. Other: No additional soft tissue abnormalities are seen. Musculoskeletal: No acute osseous abnormalities are identified. The visualized musculature is unremarkable in appearance. IMPRESSION: 1. Moderate left-sided hydronephrosis, reflecting a large obstructing 1.4 x  1.2 cm stone at the left renal pelvis. This will not pass on its own. Diffuse left-sided perinephric stranding and fluid noted. Urology consultation is recommended. 2. Right renal cyst. 3. Diffuse fatty infiltration within the liver. Electronically Signed   By: Garald Balding [name]   On: 07/28/2017 06:32    Procedures Procedures (including critical care time)  Medications Ordered in ED Medications  HYDROmorphone (DILAUDID) tablet 1 mg (not administered)  sodium chloride 0.9 % bolus 1,000 mL (0 mLs Intravenous Stopped 07/28/17 0614)  ketorolac (TORADOL) 30 MG/ML injection 30 mg (30 mg Intravenous Given 07/28/17 0515)  HYDROmorphone (DILAUDID) injection 1 mg (1 mg Intravenous Given 07/28/17 0516)  ondansetron (ZOFRAN) injection 4 mg (4 mg Intravenous Given 07/28/17 0514)     Initial Impression / Assessment and Plan / ED Course  I have reviewed the triage vital signs and the nursing notes.  Pertinent labs & imaging results that were available during my care of the patient were reviewed by me and considered in my medical decision making (see chart for details).    6:35 AM CT scan shows a 1.2 x 1.4 mm obstructing stone in the left renal pelvis. Will consult urology.  6:42 AM Case discussed with Dr. Alyson Ingles of [facility] who has viewed the patient's CT scan. He states that the patient should call the office in the AM to be seen by Dr. Diona Fanti, the on call Urologist. He also requests CBC be drawn. If WBC <15, patient OK for outpatient follow up as planned. Should she develop a fever over 100.54F, Dr. Alyson Ingles stresses the need for patient to promptly return to the ED.  7:10 AM Patient signed out to Rona Ravens, PA-C at change of shift. Patient pending CBC. I have also discussed the patient's blood pressure with her. If blood pressure improves to a systolic of 412 or below with a diastolic of 90 or below, I believe the patient is stable for outpatient blood pressure recheck without initiation of  medication. If this is unable to be achieved while in the ED, however, patient is aware that she will require initiation of blood pressure medication for management of HTN. Would likely start on low dose Norvasc if medical management indicated.   Final Clinical Impressions(s) / ED Diagnoses   Final diagnoses:  Kidney stone on left side  Renal colic  Hypertension not at goal    New Prescriptions New Prescriptions   No medications on file     Antonietta Breach, PA-C 07/28/17 [id number]    Orpah Greek, MD 07/28/17 [phone]

## 2017-07-28 NOTE — Progress Notes (Signed)
Dr. Kalman Shan in to see patient- made aware of patient's blood pressures

## 2017-07-28 NOTE — Progress Notes (Signed)
I.V. Infusing satisfactorily- no redness nor swelling noted at the site

## 2017-07-28 NOTE — Anesthesia Preprocedure Evaluation (Signed)
Anesthesia Evaluation  Patient identified by MRN, date of birth, ID band Patient awake    Reviewed: Allergy & Precautions, NPO status , Patient's Chart, lab work & pertinent test results  Airway Mallampati: II  TM Distance: >3 FB Neck ROM: Full    Dental no notable dental hx.    Pulmonary neg pulmonary ROS,    Pulmonary exam normal breath sounds clear to auscultation       Cardiovascular negative cardio ROS Normal cardiovascular exam Rhythm:Regular Rate:Normal     Neuro/Psych negative neurological ROS  negative psych ROS   GI/Hepatic negative GI ROS, Neg liver ROS,   Endo/Other  negative endocrine ROS  Renal/GU negative Renal ROS  negative genitourinary   Musculoskeletal negative musculoskeletal ROS (+)   Abdominal   Peds negative pediatric ROS (+)  Hematology negative hematology ROS (+)   Anesthesia Other Findings   Reproductive/Obstetrics negative OB ROS                             Anesthesia Physical Anesthesia Plan  ASA: II  Anesthesia Plan: General   Post-op Pain Management:    Induction: Intravenous  PONV Risk Score and Plan: 2 and Ondansetron and Dexamethasone  Airway Management Planned: LMA  Additional Equipment:   Intra-op Plan:   Post-operative Plan: Extubation in OR  Informed Consent: I have reviewed the patients History and Physical, chart, labs and discussed the procedure including the risks, benefits and alternatives for the proposed anesthesia with the patient or authorized representative who has indicated his/her understanding and acceptance.   Dental advisory given  Plan Discussed with: CRNA and Surgeon  Anesthesia Plan Comments:         Anesthesia Quick Evaluation

## 2017-07-28 NOTE — ED Notes (Signed)
RAdiology notified that a KUB was needed prior to OR. [facility] Radiology notified because ED radiology was with another patient.

## 2017-07-28 NOTE — ED Notes (Signed)
TTS and spanish interpreter in the room with the patient.

## 2017-07-28 NOTE — Anesthesia Postprocedure Evaluation (Signed)
Anesthesia Post Note  Patient: Whitney Shelton  Procedure(s) Performed: Procedure(s) (LRB): CYSTOSCOPY RETROGRADE WITH LEFT STENT PLACEMENT (Left)     Patient location during evaluation: PACU Anesthesia Type: General Level of consciousness: awake and alert Pain management: pain level controlled Vital Signs Assessment: post-procedure vital signs reviewed and stable Respiratory status: spontaneous breathing, nonlabored ventilation, respiratory function stable and patient connected to nasal cannula oxygen Cardiovascular status: blood pressure returned to baseline and stable Postop Assessment: no signs of nausea or vomiting Anesthetic complications: no    Last Vitals:  Vitals:   07/28/17 1330 07/28/17 1337  BP: (!) 158/108 (!) 149/91  Pulse: 69 68  Resp: 14 13  Temp:      Last Pain:  Vitals:   07/28/17 1322  TempSrc:   PainSc: 0-No pain                 Yukiko Minnich S

## 2017-07-28 NOTE — Op Note (Signed)
Preoperative diagnosis: Large left renal pelvic stone with hydronephrosis  Postoperative diagnosis: Same  Principal procedure: Cystoscopy, left retrograde ureteropyelogram with fluoroscopic interpretation, placement of 6 French by 24 centimeter contour double-J stent and left ureter, without tether  Surgeon: Jinx Gilden  Anesthesia: Gen.  Complications: None  Specimen: None  Estimated blood loss: None  Indications: 51 year old female with symptomatic very large left UPJ/renal pelvic stone with associated hydronephrosis.  Presentation to the emergency room early this morning.  She had no evidence of infection, but she did have leukocytosis and significant nephric stranding from her obstructive process.  Because of the large size of the stone and other findings, it was recommended that the patient undergo urgent stenting followed by eventual shockwave lithotripsy versus ureteroscopy versus percutaneous management.  The stone is radiolucent, unfortunately, and the patient will not be a candidate for shockwave lithotripsy.  She presents at this time for urgent stenting to decompress the left renal unit, and advanced the eventual management later on.  Description of procedure: The patient was properly identified in the holding area.  She received IV antibiotics--2 grams of Ancef.  She was then taken the operating room where general anesthesia was administered.  She was placed in the dorsolithotomy position.  Genitalia and perineum were prepped and draped.  Proper timeout was performed.  A 21 French panendoscope was placed in her bladder.  The bladder was inspected circumferentially.  Urothelium was normal.  Ureteral orifices were normal in configuration and location.  A 6 French open-ended catheter was advanced through the scope, and a left retrograde ureteropyelogram was performed with Omnipaque.  This revealed a normal ureter.  There was a filling defect with obstruction at the UPJ with minimal  filling of the dilated left pyelo-calyceal system.  No filling defects were seen within the ureter or pelvis other than the identified stone.  At this point, a 0.038 in sensor-tip guidewire was advanced through the open-ended catheter, easily passed the stone with a curl seen in the upper pole calyx.  I then passed a 24 centimeter rest 6 French contour double-J stent with the tether removed over top of the guidewire.  Once positioned, the guidewire was removed, the stent was adequately deployed in the ureter with excellent proximal and distal curl seen fluoroscopically and cystoscopically.  The bladder was drained.  The scope was then removed.  The patient was then awakened and taken to the PACU.  She tolerated procedure well.

## 2017-07-28 NOTE — Discharge Instructions (Signed)
1. You may see some blood in the urine and may have some burning with urination for 48-72 hours. You also may notice that you have to urinate more frequently or urgently after your procedure which is normal.  2. You should call should you develop an inability urinate, fever > 101, persistent nausea and vomiting that prevents you from eating or drinking to stay hydrated.  3. If you have a stent, you will likely urinate more frequently and urgently until the stent is removed and you may experience some discomfort/pain in the lower abdomen and flank especially when urinating. You may take pain medication prescribed to you if needed for pain. You may also intermittently have blood in the urine until the stent is removed. If you have a catheter, you will be taught how to take care of the catheter by the nursing staff prior to discharge from the hospital.  You may periodically feel a strong urge to void with the catheter in place.  This is a bladder spasm and most often can occur when having a bowel movement or moving around. It is typically self-limited and usually will stop after a few minutes.  You may use some Vaseline or Neosporin around the tip of the catheter to reduce friction at the tip of the penis. You may also see some blood in the urine.  A very small amount of blood can make the urine look quite red.  As long as the catheter is draining well, there usually is not a problem.  However, if the catheter is not draining well and is bloody, you should call the office [phone]) to notify us.Call the office of [facility] when they open this morning. Dr. Alyson Ingles advises that you are seen by the on-call urologist, Dr. Diona Fanti, today. Take Dilaudid as needed for pain. We also advised follow-up with a primary care doctor within 1 week to have your blood pressure rechecked. If you develop a fever over 100.78F prior to your urology follow-up appointment, probably return to the emergency department for  repeat evaluation.

## 2017-07-28 NOTE — H&P (Signed)
H&P  Chief Complaint: Kidney stone  History of Present Illness: Whitney Shelton is a 51 y.o. year old female who had sudden onset of left flank pain about 10 hours ago.  It was associated with nausea.  She does have a prior history of kidney stones.  She presented to the emergency room here at [facility] where CT stone protocol was performed revealing a 12 millimeter left renal pelvic/UPJ stone and significant hydronephrosis and perinephric stranding.  She has a white count of 19,000, but her urinalysis is clear.  Because of the significant size of the stone, its location and hydronephrosis, urologic consultation is requested.  She underwent management of a stone 5 years ago by Dr. Tresa Moore.  Apparently, she has a uric acid stone.  Currently, she has some mild chills but has not had fevers.  She has been afebrile in the emergency room.  She has been hypertensive and has been treated appropriately for this.  Past Medical History:  Diagnosis Date  . Complication of anesthesia HARD TO WAKE  . Fibroids   . Frequency of urination   . Migraine   . Nocturia   . Renal calculus or stone LEFT SIDE M X1  NON-OBSTRUCTIVE  . Unspecified asthma(493.90) HX BRONCHIAL ASTHMA  --- LAST BOUT OF BRONCHITIS LAST WINTER  2012  . Urgency of urination     Past Surgical History:  Procedure Laterality Date  . CYSTOSCOPY W/ URETERAL STENT PLACEMENT  09/22/2012   Procedure: CYSTOSCOPY WITH RETROGRADE PYELOGRAM/URETERAL STENT PLACEMENT;  Surgeon: Reece Packer, MD;  Location: [facility];  Service: Urology;  Laterality: Left;  . CYSTOSCOPY W/ URETERAL STENT REMOVAL  10/05/2012   Procedure: CYSTOSCOPY WITH STENT REMOVAL;  Surgeon: Alexis Frock, MD;  Location: [facility];  Service: Urology;;  . [place], URETEROSCOPY AND STENT PLACEMENT  10/26/2012   Procedure: CYSTOSCOPY WITH RETROGRADE PYELOGRAM, URETEROSCOPY AND STENT PLACEMENT;  Surgeon: Alexis Frock, MD;  Location:  [facility];  Service: Urology;  Laterality: Left;  . ENDOMETRIAL ABLATION W/ NOVASURE  04-25-2004  . HYSTEROSCOPY WITH RESECTOSCOPE  01-04-2004   RESECTION FIBROID  . URETEROSCOPY  10/05/2012   Procedure: URETEROSCOPY;  Surgeon: Alexis Frock, MD;  Location: [facility];  Service: Urology;  Laterality: Left;  . WISDOM TOOTH EXTRACTION  AGE 2   GEN. ANES.    Home Medications:   (Not in a hospital admission)  Allergies:  Allergies  Allergen Reactions  . Vicodin [Hydrocodone-Acetaminophen] Nausea Only  . Percocet [Oxycodone-Acetaminophen] Nausea Only    History reviewed. No pertinent family history.  Social History:  reports that she has never smoked. She has never used smokeless tobacco. She reports that she does not drink alcohol or use drugs.  ROS: A complete review of systems was performed.  All systems are negative except for pertinent findings as noted.  Physical Exam:  Vital signs in last 24 hours: Temp:  [98 F (36.7 C)-98.4 F (36.9 C)] 98.4 F (36.9 C) (08/08 0614) Pulse Rate:  [69-84] 69 (08/08 0930) Resp:  [20] 20 (08/08 0614) BP: (162-223)/(92-112) 162/96 (08/08 0930) SpO2:  [96 %-98 %] 97 % (08/08 0930) General:  Alert and oriented, No acute distress HEENT: Normocephalic, atraumatic Neck: No JVD or lymphadenopathy Cardiovascular: Regular rate and rhythm Lungs: Normal inspiratory and expiratory excursionontender, nondistended, no abdominal masses left CVA tenderness  Extremities: No edema Neurologic: Grossly intact  Laboratory Data:  Results for orders placed or performed during the hospital encounter of  07/28/17 (from the past 24 hour(s))  Urinalysis, Routine w reflex microscopic- may I&O cath if menses     Status: Abnormal   Collection Time: 07/28/17  5:00 AM  Result Value Ref Range   Color, Urine STRAW (A) YELLOW   APPearance CLEAR CLEAR   Specific Gravity, Urine 1.013 1.005 - 1.030   pH 5.0 5.0 - 8.0   Glucose, UA  50 (A) NEGATIVE mg/dL   Hgb urine dipstick SMALL (A) NEGATIVE   Bilirubin Urine NEGATIVE NEGATIVE   Ketones, ur 5 (A) NEGATIVE mg/dL   Protein, ur 30 (A) NEGATIVE mg/dL   Nitrite NEGATIVE NEGATIVE   Leukocytes, UA NEGATIVE NEGATIVE   RBC / HPF 0-5 0 - 5 RBC/hpf   WBC, UA 0-5 0 - 5 WBC/hpf   Bacteria, UA RARE (A) NONE SEEN   Squamous Epithelial / LPF 0-5 (A) NONE SEEN   Mucous PRESENT   Pregnancy, urine     Status: None   Collection Time: 07/28/17  5:00 AM  Result Value Ref Range   Preg Test, Ur NEGATIVE NEGATIVE  I-stat chem 8, ed     Status: Abnormal   Collection Time: 07/28/17  5:13 AM  Result Value Ref Range   Sodium 138 135 - 145 mmol/L   Potassium 4.3 3.5 - 5.1 mmol/L   Chloride 105 101 - 111 mmol/L   BUN 22 (H) 6 - 20 mg/dL   Creatinine, Ser 0.90 0.44 - 1.00 mg/dL   Glucose, Bld 213 (H) 65 - 99 mg/dL   Calcium, Ion 1.20 1.15 - 1.40 mmol/L   TCO2 22 0 - 100 mmol/L   Hemoglobin 14.6 12.0 - 15.0 g/dL   HCT 43.0 36.0 - 46.0 %  CBC     Status: Abnormal   Collection Time: 07/28/17  5:13 AM  Result Value Ref Range   WBC 19.2 (H) 4.0 - 10.5 K/uL   RBC 4.69 3.87 - 5.11 MIL/uL   Hemoglobin 13.6 12.0 - 15.0 g/dL   HCT 41.1 36.0 - 46.0 %   MCV 87.6 78.0 - 100.0 fL   MCH 29.0 26.0 - 34.0 pg   MCHC 33.1 30.0 - 36.0 g/dL   RDW 13.6 11.5 - 15.5 %   Platelets 326 150 - 400 K/uL   No results found for this or any previous visit (from the past 240 hour(s)). Creatinine:  Recent Labs  07/28/17 0513  CREATININE 0.90    Radiologic Imaging: Ct Renal Stone Study  Result Date: 07/28/2017 CLINICAL DATA:  Acute onset of left flank pain and microhematuria. Initial encounter. EXAM: CT ABDOMEN AND PELVIS WITHOUT CONTRAST TECHNIQUE: Multidetector CT imaging of the abdomen and pelvis was performed following the standard protocol without IV contrast. COMPARISON:  CT of the abdomen and pelvis from 09/21/2012 FINDINGS: Lower chest: The visualized lung bases are grossly clear. The visualized  portions of the mediastinum are unremarkable. Hepatobiliary: Diffuse fatty infiltration is noted within the liver. The gallbladder is unremarkable in appearance. The common bile duct remains normal in caliber. Pancreas: The pancreas is within normal limits. Spleen: The spleen is unremarkable in appearance. Adrenals/Urinary Tract: The adrenal glands are unremarkable in appearance. Moderate left-sided hydronephrosis is noted, reflecting a large obstructing 1.4 x 1.2 cm stone at the left renal pelvis. Diffuse left-sided perinephric stranding and fluid is noted. No nonobstructing renal stones are identified. A right renal cyst is noted. Stomach/Bowel: The stomach is unremarkable in appearance. The small bowel is within normal limits. The appendix is not visualized; there is  no evidence for appendicitis. The colon is unremarkable in appearance. Vascular/Lymphatic: Minimal calcification is seen along the abdominal aorta. No retroperitoneal or pelvic sidewall lymphadenopathy is seen. Reproductive: The bladder is mildly distended and within normal limits. The uterus is grossly unremarkable in appearance. The ovaries are relatively symmetric. No suspicious adnexal masses are seen. Other: No additional soft tissue abnormalities are seen. Musculoskeletal: No acute osseous abnormalities are identified. The visualized musculature is unremarkable in appearance. IMPRESSION: 1. Moderate left-sided hydronephrosis, reflecting a large obstructing 1.4 x 1.2 cm stone at the left renal pelvis. This will not pass on its own. Diffuse left-sided perinephric stranding and fluid noted. Urology consultation is recommended. 2. Right renal cyst. 3. Diffuse fatty infiltration within the liver. Electronically Signed   By: Garald Balding [name]   On: 07/28/2017 06:32   The above laboratory studies as well as radiographic studies were independently reviewed by myself.  In addition, I showed the patient the CT images.   Impression/Assess: Large left  renal stone with significant hydronephrosis. I discussed the case with the patient and her husband.  I would suggest, at this point, placement of a double-J stent to alleviate her obstruction.  In addition, I will obtain a KUB to see if this is visible on a plain film, that would make shockwave lithotripsy a reasonable choice of eventual treatment.  If it is not visible, consider eventual percutaneous nephrolithotomy, chemolysis, or staged ureteroscopy.  I will admit her for observation, we will proceed with stent placement later on today.  Unfortunately, even if the stone is visible on a KUB, she will not be a candidate for shockwave lithotripsy within the next 3 days, as she has been given Toradol.  Jorja Loa 07/28/2017, 10:08 AM  Lillette Boxer. Taino Maertens MD

## 2017-07-28 NOTE — ED Triage Notes (Signed)
Pt c/o L sided flank pain starting at midnight. She reports a history of kidney stones needing surgical removal. Pt is unable to sit or stand still in triage. Denies hematuria. A&Ox4.

## 2017-07-28 NOTE — ED Provider Notes (Signed)
Patient signed out to me by Antonietta Breach, PA-C.  Patient with history of recurrent kidney stone requiring surgery in the past. She is here with left flank pain. She was found to be hypotensive with systolic blood pressure in the 230s initially which has improved to 190s after receiving pain medication and feeling a little bit more comfortable. She does not have any history of hypertension. Her renal stone study demonstrated a 1.4 cm obstructing stone in the left renal pelvis. Urology was consulted. They recommend admission if white count is greater than 15. Her current white count is 19.2. She still in pain. Additional pain medication given. Plan to recheck her to urologist have patient admitted for further care. Hydralazine given for her blood pressure, additional pain medication provided. Patient made aware of plan and agrees with plan.  9:04 AM Appreciate consultation from Urologist Dr. Dorina Hoyer who agrees to see pt in the ER and will admit for further care. Pt voice understanding and agrees with plan.   BP (!) 192/105 (BP Location: Right Arm)   Pulse 80   Temp 98.4 F (36.9 C) (Oral)   Resp 20   SpO2 98%    Results for orders placed or performed during the hospital encounter of 07/28/17  Urinalysis, Routine w reflex microscopic- may I&O cath if menses  Result Value Ref Range   Color, Urine STRAW (A) YELLOW   APPearance CLEAR CLEAR   Specific Gravity, Urine 1.013 1.005 - 1.030   pH 5.0 5.0 - 8.0   Glucose, UA 50 (A) NEGATIVE mg/dL   Hgb urine dipstick SMALL (A) NEGATIVE   Bilirubin Urine NEGATIVE NEGATIVE   Ketones, ur 5 (A) NEGATIVE mg/dL   Protein, ur 30 (A) NEGATIVE mg/dL   Nitrite NEGATIVE NEGATIVE   Leukocytes, UA NEGATIVE NEGATIVE   RBC / HPF 0-5 0 - 5 RBC/hpf   WBC, UA 0-5 0 - 5 WBC/hpf   Bacteria, UA RARE (A) NONE SEEN   Squamous Epithelial / LPF 0-5 (A) NONE SEEN   Mucous PRESENT   Pregnancy, urine  Result Value Ref Range   Preg Test, Ur NEGATIVE NEGATIVE  CBC  Result  Value Ref Range   WBC 19.2 (H) 4.0 - 10.5 K/uL   RBC 4.69 3.87 - 5.11 MIL/uL   Hemoglobin 13.6 12.0 - 15.0 g/dL   HCT 41.1 36.0 - 46.0 %   MCV 87.6 78.0 - 100.0 fL   MCH 29.0 26.0 - 34.0 pg   MCHC 33.1 30.0 - 36.0 g/dL   RDW 13.6 11.5 - 15.5 %   Platelets 326 150 - 400 K/uL  I-stat chem 8, ed  Result Value Ref Range   Sodium 138 135 - 145 mmol/L   Potassium 4.3 3.5 - 5.1 mmol/L   Chloride 105 101 - 111 mmol/L   BUN 22 (H) 6 - 20 mg/dL   Creatinine, Ser 0.90 0.44 - 1.00 mg/dL   Glucose, Bld 213 (H) 65 - 99 mg/dL   Calcium, Ion 1.20 1.15 - 1.40 mmol/L   TCO2 22 0 - 100 mmol/L   Hemoglobin 14.6 12.0 - 15.0 g/dL   HCT 43.0 36.0 - 46.0 %   Ct Renal Stone Study  Result Date: 07/28/2017 CLINICAL DATA:  Acute onset of left flank pain and microhematuria. Initial encounter. EXAM: CT ABDOMEN AND PELVIS WITHOUT CONTRAST TECHNIQUE: Multidetector CT imaging of the abdomen and pelvis was performed following the standard protocol without IV contrast. COMPARISON:  CT of the abdomen and pelvis from 09/21/2012 FINDINGS: Lower  chest: The visualized lung bases are grossly clear. The visualized portions of the mediastinum are unremarkable. Hepatobiliary: Diffuse fatty infiltration is noted within the liver. The gallbladder is unremarkable in appearance. The common bile duct remains normal in caliber. Pancreas: The pancreas is within normal limits. Spleen: The spleen is unremarkable in appearance. Adrenals/Urinary Tract: The adrenal glands are unremarkable in appearance. Moderate left-sided hydronephrosis is noted, reflecting a large obstructing 1.4 x 1.2 cm stone at the left renal pelvis. Diffuse left-sided perinephric stranding and fluid is noted. No nonobstructing renal stones are identified. A right renal cyst is noted. Stomach/Bowel: The stomach is unremarkable in appearance. The small bowel is within normal limits. The appendix is not visualized; there is no evidence for appendicitis. The colon is  unremarkable in appearance. Vascular/Lymphatic: Minimal calcification is seen along the abdominal aorta. No retroperitoneal or pelvic sidewall lymphadenopathy is seen. Reproductive: The bladder is mildly distended and within normal limits. The uterus is grossly unremarkable in appearance. The ovaries are relatively symmetric. No suspicious adnexal masses are seen. Other: No additional soft tissue abnormalities are seen. Musculoskeletal: No acute osseous abnormalities are identified. The visualized musculature is unremarkable in appearance. IMPRESSION: 1. Moderate left-sided hydronephrosis, reflecting a large obstructing 1.4 x 1.2 cm stone at the left renal pelvis. This will not pass on its own. Diffuse left-sided perinephric stranding and fluid noted. Urology consultation is recommended. 2. Right renal cyst. 3. Diffuse fatty infiltration within the liver. Electronically Signed   By: Garald Balding [name]   On: 07/28/2017 06:32      Domenic Moras, PA-C 07/28/17 2956    Drenda Freeze, MD 07/28/17 [id number]

## 2017-07-28 NOTE — Progress Notes (Addendum)
Reviewed case with Dr. Lenon Ahmadi to be discharged home today

## 2017-07-28 NOTE — ED Notes (Signed)
Urology consult in the room with the patient.

## 2017-07-28 NOTE — ED Notes (Signed)
CALL REPORT SANDRA [phone] @ [phone]

## 2017-07-28 NOTE — Transfer of Care (Signed)
Immediate Anesthesia Transfer of Care Note  Patient: Whitney Shelton  Procedure(s) Performed: Procedure(s): CYSTOSCOPY RETROGRADE WITH LEFT STENT PLACEMENT (Left)  Patient Location: PACU  Anesthesia Type:General  Level of Consciousness: awake, alert , oriented and patient cooperative  Airway & Oxygen Therapy: Patient Spontanous Breathing and Patient connected to face mask oxygen  Post-op Assessment: Report given to RN and Post -op Vital signs reviewed and stable  Post vital signs: Reviewed and stable  Last Vitals:  Vitals:   07/28/17 1100 07/28/17 1130  BP: (!) 193/78 (!) 169/84  Pulse: 67 (!) 56  Resp: 16 12  Temp:      Last Pain:  Vitals:   07/28/17 1055  TempSrc:   PainSc: 0-No pain         Complications: No apparent anesthesia complications

## 2017-07-29 ENCOUNTER — Encounter (HOSPITAL_COMMUNITY): Payer: Self-pay | Admitting: Urology

## 2017-07-29 ENCOUNTER — Other Ambulatory Visit: Payer: Self-pay | Admitting: Urology

## 2017-08-05 ENCOUNTER — Encounter (HOSPITAL_COMMUNITY)
Admission: RE | Admit: 2017-08-05 | Discharge: 2017-08-05 | Disposition: A | Discharge status: Home / Self Care | Payer: 59 | Source: Ambulatory Visit | Attending: Urology | Admitting: Urology

## 2017-08-05 ENCOUNTER — Encounter (HOSPITAL_COMMUNITY): Payer: Self-pay

## 2017-08-05 DIAGNOSIS — N201 Calculus of ureter: Secondary | ICD-10-CM | POA: Diagnosis not present

## 2017-08-05 DIAGNOSIS — Z01812 Encounter for preprocedural laboratory examination: Secondary | ICD-10-CM | POA: Diagnosis present

## 2017-08-05 LAB — BASIC METABOLIC PANEL
ANION GAP: 10 (ref 5–15)
BUN: 18 mg/dL (ref 6–20)
CHLORIDE: 107 mmol/L (ref 101–111)
CO2: 23 mmol/L (ref 22–32)
Calcium: 9.7 mg/dL (ref 8.9–10.3)
Creatinine, Ser: 0.88 mg/dL (ref 0.44–1.00)
GFR calc non Af Amer: >60 mL/min (ref >60)
GLUCOSE: 125 mg/dL — AB (ref 65–99)
POTASSIUM: 4.3 mmol/L (ref 3.5–5.1)
Sodium: 140 mmol/L (ref 135–145)

## 2017-08-05 LAB — CBC
HEMATOCRIT: 38.9 % (ref 36.0–46.0)
HEMOGLOBIN: 13.1 g/dL (ref 12.0–15.0)
MCH: 29 pg (ref 26.0–34.0)
MCHC: 33.7 g/dL (ref 30.0–36.0)
MCV: 86.1 fL (ref 78.0–100.0)
Platelets: 292 10*3/uL (ref 150–400)
RBC: 4.52 MIL/uL (ref 3.87–5.11)
RDW: 13.2 % (ref 11.5–15.5)
WBC: 12.6 10*3/uL — AB (ref 4.0–10.5)

## 2017-08-05 LAB — PREGNANCY, URINE: PREG TEST UR: NEGATIVE

## 2017-08-05 NOTE — Progress Notes (Addendum)
U-PREG, URINALYSIS, CBC [ABNORMAL], BMP[ABNORMAL] 07-28-17 EPIC  EKG 08-07-17 EPIC

## 2017-08-05 NOTE — Patient Instructions (Addendum)
Whitney Shelton  08/05/2017   Your procedure is scheduled on: 08-11-17  Report to [facility] Main  Entrance Take [facility]  elevators to 3rd floor to  [place] at 1:15PM.   Call this number if you have problems the morning of surgery [phone]    Remember: ONLY 1 PERSON MAY GO WITH YOU TO SHORT STAY TO GET  READY MORNING OF YOUR SURGERY.    Do not eat food After Midnight. YOU CAN HAVE CLEAR LIQUIDS FROM MIDNIGHT UNTIL 715AM DAY OF SURGERY. NOTHING BY MOUTH AFTER 715AM!     Take these medicines the morning of surgery with A SIP OF WATER: oxybutynin as needed, tramadol as needed                                You may not have any metal on your body including hair pins and              piercings  Do not wear jewelry, make-up, lotions, powders or perfumes, deodorant             Do not wear nail polish.  Do not shave  48 hours prior to surgery.                 Do not bring valuables to the hospital. [place].  Contacts, dentures or bridgework may not be worn into surgery.      Patients discharged the day of surgery will not be allowed to drive home.  Name and phone number of your driver:  Special Instructions: N/A              Please read over the following fact sheets you were given: _____________________________________________________________________    CLEAR LIQUID DIET   Foods Allowed                                                                     Foods Excluded  Coffee and tea, regular and decaf                             liquids that you cannot  Plain Jell-O in any flavor                                             see through such as: Fruit ices (not with fruit pulp)                                     milk, soups, orange juice  Iced Popsicles                                    All solid food Carbonated  beverages, regular and diet                                    Cranberry, grape and  apple juices Sports drinks like Gatorade Lightly seasoned clear broth or consume(fat free) Sugar, honey syrup  Sample Menu Breakfast                                Lunch                                     Supper Cranberry juice                    Beef broth                            Chicken broth Jell-O                                     Grape juice                           Apple juice Coffee or tea                        Jell-O                                      Popsicle                                                Coffee or tea                        Coffee or tea  _____________________________________________________________________  [facility] - Preparing for Surgery Before surgery, you can play an important role.  Because skin is not sterile, your skin needs to be as free of germs as possible.  You can reduce the number of germs on your skin by washing with CHG (chlorahexidine gluconate) soap before surgery.  CHG is an antiseptic cleaner which kills germs and bonds with the skin to continue killing germs even after washing. Please DO NOT use if you have an allergy to CHG or antibacterial soaps.  If your skin becomes reddened/irritated stop using the CHG and inform your nurse when you arrive at Short Stay. Do not shave (including legs and underarms) for at least 48 hours prior to the first CHG shower.  You may shave your face/neck. Please follow these instructions carefully:  1.  Shower with CHG Soap the night before surgery and the  morning of Surgery.  2.  If you choose to wash your hair, wash your hair first as usual with your  normal  shampoo.  3.  After you shampoo, rinse your hair and body thoroughly to remove the  shampoo.  4.  Use CHG as you would any other liquid soap.  You can apply chg directly  to the skin and wash                       Gently with a scrungie or clean washcloth.  5.  Apply the CHG Soap to your body ONLY FROM THE NECK DOWN.   Do  not use on face/ open                           Wound or open sores. Avoid contact with eyes, ears mouth and genitals (private parts).                       Wash face,  Genitals (private parts) with your normal soap.             6.  Wash thoroughly, paying special attention to the area where your surgery  will be performed.  7.  Thoroughly rinse your body with warm water from the neck down.  8.  DO NOT shower/wash with your normal soap after using and rinsing off  the CHG Soap.                9.  Pat yourself dry with a clean towel.            10.  Wear clean pajamas.            11.  Place clean sheets on your bed the night of your first shower and do not  sleep with pets. Day of Surgery : Do not apply any lotions/deodorants the morning of surgery.  Please wear clean clothes to the hospital/surgery center.  FAILURE TO FOLLOW THESE INSTRUCTIONS MAY RESULT IN THE CANCELLATION OF YOUR SURGERY PATIENT SIGNATURE_________________________________  NURSE SIGNATURE__________________________________  ________________________________________________________________________

## 2017-08-05 NOTE — Progress Notes (Signed)
Preop Eval Pt. Describes feelings of dysphagia and reflux, feeling like something is stuck in her throat. She also described an instance where she woke from sleep in a panic like state and possibly felt palpitations. This appeared to be a single incident. She states this is all new since surgery. I briefly examined her in PAT. She was in no acute distress. I did not feel any thyroid hypertrophy or palpable mass. Trachea was midline. Voice seemed normal. Not hoarse. No swelling in pharynx/tonsils.  I discussed that she needs to establish primary care ASAP. Preferably prior to her surgery on 8/22. She is hypertensive and likely diabetic given that her blood glucose was 213 on labs prior to her cysto/stent on 8/8. I recommended that she be evaluated for her dysphagia by either an ENT or GI specialist. I stated that if she presents with wildly elevated BP on DOS she would likely be delayed for BP control. She expressed understanding.

## 2017-08-05 NOTE — Progress Notes (Signed)
Patient seen by anesthesia Dr Lissa Hoard at PAT appt. Progress note routed via epic to Dr Alexis Frock for review .

## 2017-08-10 MED ORDER — GENTAMICIN SULFATE 40 MG/ML IJ SOLN
400.0000 mg | INTRAVENOUS | Status: AC
  Administered 2017-08-11: 400 mg via INTRAVENOUS
  Filled 2017-08-10: qty 10

## 2017-08-11 ENCOUNTER — Ambulatory Visit (HOSPITAL_COMMUNITY): Payer: 59

## 2017-08-11 ENCOUNTER — Ambulatory Visit (HOSPITAL_COMMUNITY): Payer: 59 | Admitting: Anesthesiology

## 2017-08-11 ENCOUNTER — Encounter (HOSPITAL_COMMUNITY)
Admission: RE | Disposition: A | Discharge status: Home / Self Care | Payer: Self-pay | Source: Ambulatory Visit | Attending: Urology

## 2017-08-11 ENCOUNTER — Encounter (HOSPITAL_COMMUNITY): Payer: Self-pay | Admitting: *Deleted

## 2017-08-11 ENCOUNTER — Ambulatory Visit (HOSPITAL_COMMUNITY)
Admission: RE | Admit: 2017-08-11 | Discharge: 2017-08-11 | Disposition: A | Discharge status: Home / Self Care | Payer: 59 | Source: Ambulatory Visit | Attending: Urology | Admitting: Urology

## 2017-08-11 DIAGNOSIS — Z87442 Personal history of urinary calculi: Secondary | ICD-10-CM | POA: Insufficient documentation

## 2017-08-11 DIAGNOSIS — N201 Calculus of ureter: Secondary | ICD-10-CM | POA: Diagnosis not present

## 2017-08-11 DIAGNOSIS — R350 Frequency of micturition: Secondary | ICD-10-CM | POA: Insufficient documentation

## 2017-08-11 HISTORY — PX: CYSTOSCOPY/URETEROSCOPY/HOLMIUM LASER/STENT PLACEMENT: SHX6546

## 2017-08-11 LAB — HCG, SERUM, QUALITATIVE: Preg, Serum: NEGATIVE

## 2017-08-11 SURGERY — CYSTOSCOPY/URETEROSCOPY/HOLMIUM LASER/STENT PLACEMENT
Anesthesia: General | Laterality: Left

## 2017-08-11 MED ORDER — MIDAZOLAM HCL 2 MG/2ML IJ SOLN
INTRAMUSCULAR | Status: AC
  Filled 2017-08-11: qty 2

## 2017-08-11 MED ORDER — FENTANYL CITRATE (PF) 100 MCG/2ML IJ SOLN
25.0000 ug | INTRAMUSCULAR | Status: DC | PRN
  Administered 2017-08-11 (×2): 50 ug via INTRAVENOUS

## 2017-08-11 MED ORDER — PROPOFOL 10 MG/ML IV BOLUS
INTRAVENOUS | Status: AC
  Filled 2017-08-11: qty 20

## 2017-08-11 MED ORDER — FENTANYL CITRATE (PF) 100 MCG/2ML IJ SOLN
INTRAMUSCULAR | Status: AC
  Filled 2017-08-11: qty 2

## 2017-08-11 MED ORDER — TRAMADOL HCL 50 MG PO TABS
100.0000 mg | ORAL_TABLET | Freq: Four times a day (QID) | ORAL | Status: DC | PRN
  Administered 2017-08-11: 100 mg via ORAL
  Filled 2017-08-11: qty 2

## 2017-08-11 MED ORDER — DEXTROSE IN LACTATED RINGERS 5 % IV SOLN: INTRAVENOUS | Status: DC

## 2017-08-11 MED ORDER — KETOROLAC TROMETHAMINE 30 MG/ML IJ SOLN
INTRAMUSCULAR | Status: DC | PRN
  Administered 2017-08-11: 30 mg via INTRAVENOUS
  Administered 2017-08-11: 30 mg via INTRAMUSCULAR

## 2017-08-11 MED ORDER — FENTANYL CITRATE (PF) 100 MCG/2ML IJ SOLN
INTRAMUSCULAR | Status: DC | PRN
  Administered 2017-08-11 (×2): 50 ug via INTRAVENOUS

## 2017-08-11 MED ORDER — LIDOCAINE 2% (20 MG/ML) 5 ML SYRINGE
INTRAMUSCULAR | Status: AC
  Filled 2017-08-11: qty 5

## 2017-08-11 MED ORDER — DEXAMETHASONE SODIUM PHOSPHATE 10 MG/ML IJ SOLN
INTRAMUSCULAR | Status: DC | PRN
  Administered 2017-08-11: 10 mg via INTRAVENOUS

## 2017-08-11 MED ORDER — LIDOCAINE 2% (20 MG/ML) 5 ML SYRINGE
INTRAMUSCULAR | Status: DC | PRN
  Administered 2017-08-11: 100 mg via INTRAVENOUS

## 2017-08-11 MED ORDER — SODIUM CHLORIDE 0.9 % IR SOLN
Status: DC | PRN
  Administered 2017-08-11: 5000 mL via INTRAVESICAL

## 2017-08-11 MED ORDER — ONDANSETRON HCL 4 MG/2ML IJ SOLN
INTRAMUSCULAR | Status: AC
  Filled 2017-08-11: qty 2

## 2017-08-11 MED ORDER — PROPOFOL 10 MG/ML IV BOLUS
INTRAVENOUS | Status: DC | PRN
  Administered 2017-08-11: 100 mg via INTRAVENOUS
  Administered 2017-08-11: 200 mg via INTRAVENOUS

## 2017-08-11 MED ORDER — ONDANSETRON HCL 4 MG/2ML IJ SOLN
INTRAMUSCULAR | Status: DC | PRN
  Administered 2017-08-11: 4 mg via INTRAVENOUS

## 2017-08-11 MED ORDER — TRAMADOL HCL 50 MG PO TABS: 50.0000 mg | ORAL_TABLET | Freq: Four times a day (QID) | ORAL | 0 refills | Status: DC | PRN

## 2017-08-11 MED ORDER — KETOROLAC TROMETHAMINE 10 MG PO TABS
10.0000 mg | ORAL_TABLET | Freq: Four times a day (QID) | ORAL | 1 refills | Status: DC | PRN
Start: 2017-08-11 — End: 2018-02-04

## 2017-08-11 MED ORDER — IOHEXOL 300 MG/ML  SOLN
INTRAMUSCULAR | Status: DC | PRN
  Administered 2017-08-11: 18 mL via URETHRAL

## 2017-08-11 MED ORDER — LACTATED RINGERS IV SOLN
INTRAVENOUS | Status: DC
  Administered 2017-08-11: 14:00:00 via INTRAVENOUS

## 2017-08-11 MED ORDER — SENNOSIDES-DOCUSATE SODIUM 8.6-50 MG PO TABS: 1.0000 | ORAL_TABLET | Freq: Two times a day (BID) | ORAL | 0 refills | Status: DC

## 2017-08-11 MED ORDER — FENTANYL CITRATE (PF) 100 MCG/2ML IJ SOLN
INTRAMUSCULAR | Status: AC
  Administered 2017-08-11: 50 ug via INTRAVENOUS
  Filled 2017-08-11: qty 2

## 2017-08-11 MED ORDER — MIDAZOLAM HCL 5 MG/5ML IJ SOLN
INTRAMUSCULAR | Status: DC | PRN
  Administered 2017-08-11: 2 mg via INTRAVENOUS

## 2017-08-11 MED ORDER — DEXAMETHASONE SODIUM PHOSPHATE 10 MG/ML IJ SOLN
INTRAMUSCULAR | Status: AC
  Filled 2017-08-11: qty 1

## 2017-08-11 MED ORDER — CEPHALEXIN 500 MG PO CAPS: 500.0000 mg | ORAL_CAPSULE | Freq: Two times a day (BID) | ORAL | 0 refills | Status: DC

## 2017-08-11 SURGICAL SUPPLY — 26 items
BAG URO CATCHER STRL LF (MISCELLANEOUS) ×3 IMPLANT
BASKET LASER NITINOL 1.9FR (BASKET) ×3 IMPLANT
CATH INTERMIT  6FR 70CM (CATHETERS) ×3 IMPLANT
CLOTH BEACON ORANGE TIMEOUT ST (SAFETY) ×3 IMPLANT
COVER FOOT SWITCH UNIV DISP (DRAPES) IMPLANT
COVER SURGICAL LIGHT HANDLE (MISCELLANEOUS) ×3 IMPLANT
FIBER LASER FLEXIVA 1000 (UROLOGICAL SUPPLIES) IMPLANT
FIBER LASER FLEXIVA 365 (UROLOGICAL SUPPLIES) IMPLANT
FIBER LASER FLEXIVA 550 (UROLOGICAL SUPPLIES) IMPLANT
FIBER LASER TRAC TIP (UROLOGICAL SUPPLIES) ×3 IMPLANT
GLOVE BIOGEL M STRL SZ7.5 (GLOVE) ×12 IMPLANT
GOWN STRL REUS W/TWL LRG LVL3 (GOWN DISPOSABLE) ×6 IMPLANT
GUIDEWIRE ANG ZIPWIRE 038X150 (WIRE) ×3 IMPLANT
GUIDEWIRE STR DUAL SENSOR (WIRE) ×3 IMPLANT
IV NS 1000ML (IV SOLUTION) ×2
IV NS 1000ML BAXH (IV SOLUTION) ×1 IMPLANT
MANIFOLD NEPTUNE II (INSTRUMENTS) ×3 IMPLANT
PACK CYSTO (CUSTOM PROCEDURE TRAY) ×3 IMPLANT
SHEATH ACCESS URETERAL 24CM (SHEATH) ×3 IMPLANT
SHEATH ACCESS URETERAL 38CM (SHEATH) IMPLANT
SHEATH ACCESS URETERAL 54CM (SHEATH) IMPLANT
STENT POLARIS 5FRX24 (STENTS) ×3 IMPLANT
SYR CONTROL 10ML LL (SYRINGE) IMPLANT
TUBE FEEDING 8FR 16IN STR KANG (MISCELLANEOUS) IMPLANT
TUBING CONNECTING 10 (TUBING) ×2 IMPLANT
TUBING CONNECTING 10' (TUBING) ×1

## 2017-08-11 NOTE — Brief Op Note (Signed)
08/11/2017  4:02 PM  PATIENT:  Whitney Shelton  51 y.o. female  PRE-OPERATIVE DIAGNOSIS:  LEFT URETERAL STONE  POST-OPERATIVE DIAGNOSIS:  left ureteral stone  PROCEDURE:  Procedure(s): CYSTOSCOPY/RETROGRADE/URETEROSCOPY/HOLMIUM LASER/STENT EXCHANGE (Left)  SURGEON:  Surgeon(s) and Role:    * Alexis Frock, MD - Primary  PHYSICIAN ASSISTANT:   ASSISTANTS: none   ANESTHESIA:   general  EBL:  No intake/output data recorded.  BLOOD ADMINISTERED:none  DRAINS: none   LOCAL MEDICATIONS USED:  NONE  SPECIMEN:  Source of Specimen:  Left ureteral / renal stone fragments  DISPOSITION OF SPECIMEN:  [facility] for compositional analysis  COUNTS:  YES  TOURNIQUET:  * No tourniquets in log *  DICTATION: .Other Dictation: Dictation Number [id number]  PLAN OF CARE: Discharge to home after PACU  PATIENT DISPOSITION:  PACU - hemodynamically stable.   Delay start of Pharmacological VTE agent (>24hrs) due to surgical blood loss or risk of bleeding: yes

## 2017-08-11 NOTE — Transfer of Care (Signed)
Immediate Anesthesia Transfer of Care Note  Patient: Whitney Shelton  Procedure(s) Performed: Procedure(s): CYSTOSCOPY/RETROGRADE/URETEROSCOPY/HOLMIUM LASER/STENT EXCHANGE (Left)  Patient Location: PACU  Anesthesia Type:General  Level of Consciousness: awake and alert   Airway & Oxygen Therapy: Patient Spontanous Breathing and Patient connected to face mask oxygen  Post-op Assessment: Report given to RN and Post -op Vital signs reviewed and stable  Post vital signs: Reviewed and stable  Last Vitals:  Vitals:   08/11/17 1335  BP: (!) 168/74  Pulse: 76  Resp: 18  Temp: 36.7 C  SpO2: 99%    Last Pain:  Vitals:   08/11/17 1344  TempSrc:   PainSc: 2          Complications: No apparent anesthesia complications

## 2017-08-11 NOTE — Anesthesia Preprocedure Evaluation (Addendum)
Anesthesia Evaluation  Patient identified by MRN, date of birth, ID band Patient awake    Reviewed: Allergy & Precautions, H&P , NPO status , Patient's Chart, lab work & pertinent test results  Airway Mallampati: I  TM Distance: >3 FB Neck ROM: Full    Dental no notable dental hx. (+) Teeth Intact, Dental Advisory Given   Pulmonary neg pulmonary ROS,    Pulmonary exam normal breath sounds clear to auscultation       Cardiovascular negative cardio ROS   Rhythm:Regular Rate:Normal     Neuro/Psych  Headaches, negative psych ROS   GI/Hepatic negative GI ROS, Neg liver ROS,   Endo/Other  negative endocrine ROS  Renal/GU Renal disease  negative genitourinary   Musculoskeletal   Abdominal   Peds  Hematology negative hematology ROS (+)   Anesthesia Other Findings   Reproductive/Obstetrics negative OB ROS                            Anesthesia Physical Anesthesia Plan  ASA: II  Anesthesia Plan: General   Post-op Pain Management:    Induction: Intravenous  PONV Risk Score and Plan: 4 or greater and Ondansetron, Dexamethasone and Midazolam  Airway Management Planned: LMA  Additional Equipment:   Intra-op Plan:   Post-operative Plan: Extubation in OR  Informed Consent: I have reviewed the patients History and Physical, chart, labs and discussed the procedure including the risks, benefits and alternatives for the proposed anesthesia with the patient or authorized representative who has indicated his/her understanding and acceptance.   Dental advisory given  Plan Discussed with: CRNA  Anesthesia Plan Comments:         Anesthesia Quick Evaluation

## 2017-08-11 NOTE — Discharge Instructions (Signed)
1 - You may have urinary urgency (bladder spasms) and bloody urine on / off with stent in place. This is normal.  2 - Remove tethered stent on Friday morning at home by pulling on string, then blue-white plastic tubing and discarding. Office is open Friday if any acute issues arise.   3 - Call MD or go to ER for fever >102, severe pain / nausea / vomiting not relieved by medications, or acute change in medical status    General Anesthesia, Adult, Care After These instructions provide you with information about caring for yourself after your procedure. Your health care provider may also give you more specific instructions. Your treatment has been planned according to current medical practices, but problems sometimes occur. Call your health care provider if you have any problems or questions after your procedure. What can I expect after the procedure? After the procedure, it is common to have:  Vomiting.  A sore throat.  Mental slowness.  It is common to feel:  Nauseous.  Cold or shivery.  Sleepy.  Tired.  Sore or achy, even in parts of your body where you did not have surgery.  Follow these instructions at home: For at least 24 hours after the procedure:  Do not: ? Participate in activities where you could fall or become injured. ? Drive. ? Use heavy machinery. ? Drink alcohol. ? Take sleeping pills or medicines that cause drowsiness. ? Make important decisions or sign legal documents. ? Take care of children on your own.  Rest. Eating and drinking  If you vomit, drink water, juice, or soup when you can drink without vomiting.  Drink enough fluid to keep your urine clear or pale yellow.  Make sure you have little or no nausea before eating solid foods.  Follow the diet recommended by your health care provider. General instructions  Have a responsible adult stay with you until you are awake and alert.  Return to your normal activities as told by your health care  provider. Ask your health care provider what activities are safe for you.  Take over-the-counter and prescription medicines only as told by your health care provider.  If you smoke, do not smoke without supervision.  Keep all follow-up visits as told by your health care provider. This is important. Contact a health care provider if:  You continue to have nausea or vomiting at home, and medicines are not helpful.  You cannot drink fluids or start eating again.  You cannot urinate after 8-12 hours.  You develop a skin rash.  You have fever.  You have increasing redness at the site of your procedure. Get help right away if:  You have difficulty breathing.  You have chest pain.  You have unexpected bleeding.  You feel that you are having a life-threatening or urgent problem. This information is not intended to replace advice given to you by your health care provider. Make sure you discuss any questions you have with your health care provider. Document Released: 03/15/2001 Document Revised: 05/11/2016 Document Reviewed: 11/21/2015 Elsevier Interactive Patient Education  [employer].

## 2017-08-11 NOTE — Anesthesia Procedure Notes (Signed)
Procedure Name: LMA Insertion Date/Time: 08/11/2017 3:09 PM Performed by: Danley Danker L Patient Re-evaluated:Patient Re-evaluated prior to induction Oxygen Delivery Method: Circle system utilized Preoxygenation: Pre-oxygenation with 100% oxygen Induction Type: IV induction Ventilation: Mask ventilation without difficulty LMA: LMA inserted LMA Size: 4.0 Number of attempts: 1 Placement Confirmation: positive ETCO2 and breath sounds checked- equal and bilateral Tube secured with: Tape Dental Injury: Teeth and Oropharynx as per pre-operative assessment

## 2017-08-11 NOTE — Interval H&P Note (Signed)
History and Physical Interval Note:  08/11/2017 2:37 PM  Whitney Shelton  has presented today for surgery, with the diagnosis of LEFT URETERAL STONE  The various methods of treatment have been discussed with the patient and family. After consideration of risks, benefits and other options for treatment, the patient has consented to  Procedure(s): CYSTOSCOPY/RETROGRADE/URETEROSCOPY/HOLMIUM LASER/STENT EXCHANGE (Left) as a surgical intervention .  The patient's history has been reviewed, patient examined, no change in status, stable for surgery.  I have reviewed the patient's chart and labs.  Questions were answered to the patient's satisfaction.     Iman Reinertsen

## 2017-08-11 NOTE — Anesthesia Postprocedure Evaluation (Signed)
Anesthesia Post Note  Patient: INSIYA OSHEA  Procedure(s) Performed: Procedure(s) (LRB): CYSTOSCOPY/RETROGRADE/URETEROSCOPY/HOLMIUM LASER/STENT EXCHANGE (Left)     Patient location during evaluation: PACU Anesthesia Type: General Level of consciousness: awake and alert Pain management: pain level controlled Vital Signs Assessment: post-procedure vital signs reviewed and stable Respiratory status: spontaneous breathing, nonlabored ventilation and respiratory function stable Cardiovascular status: blood pressure returned to baseline and stable Postop Assessment: no signs of nausea or vomiting Anesthetic complications: no    Last Vitals:  Vitals:   08/11/17 1710 08/11/17 1714  BP:  (!) 153/89  Pulse: 73 77  Resp: 12 12  Temp:  36.7 C  SpO2: 100% 97%    Last Pain:  Vitals:   08/11/17 1714  TempSrc:   PainSc: 0-No pain                 Garnie Borchardt,W. EDMOND

## 2017-08-11 NOTE — H&P (Signed)
Whitney Shelton is an 51 y.o. female.    Chief Complaint: Pre-op LEFT ureteroscopic stone manipulation  HPI:   1 - LEFT proximal ureteral stone - 69mm left UPJ stone, 450HU by ER CT 07/2017 on eval refractory colic and stented 0/1/09. Stone is solitary. Most recent UA without infectious parameters.  Today "Whitney Shelton" is seen to proceed with LEFT ureteroscopic stone manipulation.   Past Medical History:  Diagnosis Date  . Complication of anesthesia HARD TO WAKE   slower to arouse   . Fibroids   . Frequency of urination   . Migraine   . Nocturia   . Renal calculus or stone LEFT SIDE M X1  NON-OBSTRUCTIVE  . Unspecified asthma(493.90) HX BRONCHIAL ASTHMA  --- LAST BOUT OF BRONCHITIS LAST WINTER  2012  . Urgency of urination     Past Surgical History:  Procedure Laterality Date  . CYSTOSCOPY W/ URETERAL STENT PLACEMENT  09/22/2012   Procedure: CYSTOSCOPY WITH RETROGRADE PYELOGRAM/URETERAL STENT PLACEMENT;  Surgeon: Reece Packer, MD;  Location: [facility];  Service: Urology;  Laterality: Left;  . CYSTOSCOPY W/ URETERAL STENT REMOVAL  10/05/2012   Procedure: CYSTOSCOPY WITH STENT REMOVAL;  Surgeon: Alexis Frock, MD;  Location: [facility];  Service: Urology;;  . [place], URETEROSCOPY AND STENT PLACEMENT  10/26/2012   Procedure: CYSTOSCOPY WITH RETROGRADE PYELOGRAM, URETEROSCOPY AND STENT PLACEMENT;  Surgeon: Alexis Frock, MD;  Location: [facility];  Service: Urology;  Laterality: Left;  . CYSTOSCOPY WITH STENT PLACEMENT Left 07/28/2017   Procedure: CYSTOSCOPY RETROGRADE WITH LEFT STENT PLACEMENT;  Surgeon: Franchot Gallo, MD;  Location: [facility];  Service: Urology;  Laterality: Left;  . ENDOMETRIAL ABLATION W/ NOVASURE  04-25-2004  . HYSTEROSCOPY WITH RESECTOSCOPE  01-04-2004   RESECTION FIBROID  . URETEROSCOPY  10/05/2012   Procedure: URETEROSCOPY;  Surgeon: Alexis Frock, MD;  Location: [facility];  Service:  Urology;  Laterality: Left;  . WISDOM TOOTH EXTRACTION  AGE 53   GEN. ANES.    No family history on file. Social History:  reports that she has never smoked. She has never used smokeless tobacco. She reports that she drinks alcohol. She reports that she does not use drugs.  Allergies:  Allergies  Allergen Reactions  . Vicodin [Hydrocodone-Acetaminophen] Nausea Only  . Percocet [Oxycodone-Acetaminophen] Nausea Only    No prescriptions prior to admission.    No results found for this or any previous visit (from the past 48 hour(s)). No results found.  Review of Systems  Constitutional: Negative.  Negative for chills and fever.  HENT: Negative.   Eyes: Negative.   Respiratory: Negative.   Cardiovascular: Negative.   Gastrointestinal: Negative.  Negative for nausea and vomiting.  Genitourinary: Positive for urgency.  Musculoskeletal: Negative.   Skin: Negative.   Neurological: Negative.   Endo/Heme/Allergies: Negative.   Psychiatric/Behavioral: Negative.     There were no vitals taken for this visit. Physical Exam  Constitutional: She appears well-developed.  Eyes: Pupils are equal, round, and reactive to light.  Neck: Normal range of motion.  Cardiovascular: Normal rate.   Respiratory: Effort normal.  GI:  Significant truncal obesity  Genitourinary:  Genitourinary Comments: No CVAT  Musculoskeletal: Normal range of motion.  Neurological: She is alert.  Skin: Skin is warm.  Psychiatric: She has a normal mood and affect.     Assessment/Plan  1 - LEFT proximal ureteral stone - proceed as planned with LEFT ureteroscopic stone manipulation. Risks, benefits, alternatives, expected peri-op course  discussed previously and reiterated today.   Alexis Frock, MD 08/11/2017, 6:54 AM

## 2017-08-12 ENCOUNTER — Encounter (HOSPITAL_COMMUNITY): Payer: Self-pay | Admitting: Urology

## 2017-08-12 LAB — HIV ANTIBODY (ROUTINE TESTING W REFLEX): HIV SCREEN 4TH GENERATION: NONREACTIVE

## 2017-08-12 NOTE — Discharge Summary (Signed)
Patient ID: FAUNA NEUNER MRN: [id number] DOB/AGE: 1966/07/17 51 y.o.  Admit date: 07/28/2017 Discharge date: 08/12/2017  Primary Care Physician:  Patient, No Pcp Per  Discharge Diagnoses:   Left proximal ureteral stone. Left hydronephrosis  Consults:  None     Discharge Medications: Allergies as of 07/28/2017      Reactions   Vicodin [hydrocodone-acetaminophen] Nausea Only   Percocet [oxycodone-acetaminophen] Nausea Only      Medication List    STOP taking these medications   HYDROmorphone 2 MG tablet Commonly known as:  DILAUDID   tamsulosin 0.4 MG Caps capsule Commonly known as:  FLOMAX        Significant Diagnostic Studies:  No results found.  Brief H and P: For complete details please refer to admission H and P, but in brief The patient was admitted because of significant left flank pain and a large left proximal ureteral stone.  She was admitted for urgent stent placement to alleviate her obstruction and pain.  Hospital Course:  Active Problems:   Nephrolithiasis, uric acid .  The patient was admitted for observation, and presented to the operating room where cystoscopy and left stent placement was performed.  The patient tolerated procedure well.  She was then discharged from the hospital for eventual scheduling and treatment of her left ureteral stone.  Day of Discharge BP (!) 184/88   Pulse 73   Temp 98.2 F (36.8 C)   Resp 16   SpO2 97%   No results found for this or any previous visit (from the past 24 hour(s)).  Physical Exam: General: Alert and awake oriented x3 not in any acute distress. HEENT: anicteric sclera, pupils reactive to light and accommodation CVS: S1-S2 clear no murmur rubs or gallops Chest: clear to auscultation bilaterally, no wheezing rales or rhonchi Abdomen: soft nontender, nondistended, normal bowel sounds, no organomegaly Extremities: no cyanosis, clubbing or edema noted bilaterally Neuro: Cranial nerves II-XII intact, no focal  neurological deficits  Disposition:  Home  Diet:  No restrictions  Activity:  No restrictions     TESTS THAT NEED FOLLOW-UP  None  DISCHARGE FOLLOW-UP Follow-up Information    [facility].   Contact information: [place] [facility].   Specialty:  Emergency Medicine Why:  As needed, if symptoms worsen Contact information: [place] [id number] [place]       Franchot Gallo, MD.   Specialty:  Urology Why:  We will call you to set up follow-up. Contact information: [address] [place] [address] [phone]           Time spent on Discharge:  10 minutes  Signed: Jorja Loa 08/12/2017, 11:11 AM

## 2017-08-12 NOTE — Op Note (Signed)
NAME:  Whitney Shelton.:  MEDICAL RECORD NO.:  [id number]  LOCATION:                                 FACILITY:  PHYSICIAN:  Alexis Frock, MD          DATE OF BIRTH:  DATE OF PROCEDURE: 08/11/2017                              OPERATIVE REPORT   PREOPERATIVE DIAGNOSIS:  Left proximal ureteral renal stone.  PROCEDURE: 1. Cystoscopy left retrograde pyelogram interpretation. 2. Left ureteroscopy with laser lithotripsy. 3. Exchange of left ureteral stent, 5 x 24 Polaris with tether.  ESTIMATED BLOOD LOSS:  Nil.  COMPLICATION:  None.  SPECIMEN:  Left renal ureteral stone fragments for compositional analysis.  FINDINGS: 1. Interval retrograde positioning of left proximal ureteral stone     into the area of the lower pole calyx. 2. Complete resolution of all stone fragments larger than 1/3rd mm     following laser lithotripsy and basket extraction. 3. Successful replacement of left ureteral stent, proximal in upper     pole, distal in urinary bladder.  INDICATION:  Whitney Shelton is a 51 year old lady with history of recurrent nephrolithiasis.  She was found on workup of colicky flank pain and to have a large left proximal ureteral stone approximately 12 mm.  She underwent stenting earlier this month for temporizing as her colic was quite severe.  She now presents for definitive stone management.  Most recent urinalysis without infectious parameters.  Informed consent was obtained and placed in the medical record.  PROCEDURE IN DETAIL:  The patient being Whitney Shelton was verified.  Left ureteroscopic stone manipulation was confirmed.  Procedure was carried out.  Time-out was performed.  Intravenous antibiotics were administered.  General LMA anesthesia introduced.  The patient was placed into a low lithotomy position.  Sterile field was created by prepping and draping the patient's vagina, introitus, and proximal thighs using iodine.  Next,  cystourethroscopy was performed using a 22- French rigid cystoscope with offset lens.  Inspection of the urinary bladder revealed distal left ureteral stent exiting the left ureteral orifice.  This was grasped proximally to the level of the urethral meatus, through which a 0.038 ZIPwire was advanced to the upper pole, exchanged for a new catheter and left retrograde pyelogram was obtained.  Left retrograde pyelogram demonstrated a single left ureter with single- system left kidney.  It was somewhat bifid in orientation.  There were no obvious filling defects or narrowing at this point.  A 0.038 ZIPwire was once again advanced to the upper pole, set aside as a safety wire. An 8-French feeding tube placed in urinary bladder for pressure release. Next, semi-rigid ureteroscopy was performed in the distal four-fifths of the left ureter alongside a separate Sensor working wire.  No mucosal abnormalities or calcifications were noted.  This was exchanged for a 12/14, 24-cm ureteral access sheath at the level of proximal ureter using continuous fluoroscopic guidance.  Next, flexible digital ureteroscopy performed of the proximal ureter and systematic inspection of the left kidney including all calices x3 with a single channel flexible ureteroscope and in a lower mid calyx, a single dominant calcification was  noted.  This likely represented retrograde positioning of prior proximal ureteral stone.  This appeared to be much too large for simple basketing.  As such, holmium laser energy applied to the stone using settings of 0.2 joules and 20 hertz.  Using a dusting technique, approximately 70% of the stone was ablated.  The remainder 30% of the stone was fragmented into pieces, approximately 0.5 to 1 mm in diameter.  These were then grasped with an Escape basket, removed and set aside for compositional analysis.  Following these maneuvers, there was excellent hemostasis, complete resolution of all  stone fragments larger than 1/3rd mm.  The access sheath was removed under continuous vision.  No mucosal abnormalities were found.  Given access sheath use in the large size of the stone as well as dusting technique, it was felt that brief interval stenting would be warranted with tethered stent.  As such, a new 5 x 24 Polaris-type stent was placed over the remaining safety wire using cystoscopic and fluoroscopic guidance.  Good proximal distal deployment was noted.  The tether was left in place and fashioned in the dorsum of the mons pubis and the bladder was emptied per cystoscope.  Procedure terminated.  The patient tolerated the procedure well.  There were no immediate periprocedural complications.  The patient was taken to the postanesthesia care in stable condition.          ______________________________ Alexis Frock, MD     TM/MEDQ  D:  08/11/2017  T:  08/11/2017  Job:  [id number]

## 2017-12-29 ENCOUNTER — Other Ambulatory Visit: Payer: Self-pay | Admitting: Urology

## 2018-01-21 ENCOUNTER — Other Ambulatory Visit: Payer: Self-pay | Admitting: Obstetrics and Gynecology

## 2018-02-10 NOTE — Patient Instructions (Addendum)
Whitney Shelton  02/10/2018   Your procedure is scheduled on: 02-17-18  Report to [facility] Main  Entrance  Follow signs to Short Stay on first floor at 530 AM  Call this number if you have problems the morning of surgery [phone]   Remember: Do not eat food or drink liquids :After Midnight.     Take these medicines the morning of surgery with A SIP OF WATER: albuterol inhaler if needed and bring inhaler DO NOT TAKE ANY DIABETIC MEDICATIONS DAY OF YOUR SURGERY                How to Manage Your Diabetes Before and After Surgery  Why is it important to control my blood sugar before and after surgery? . Improving blood sugar levels before and after surgery helps healing and can limit problems. . A way of improving blood sugar control is eating a healthy diet by: o  Eating less sugar and carbohydrates o  Increasing activity/exercise o  Talking with your doctor about reaching your blood sugar goals . High blood sugars (greater than 180 mg/dL) can raise your risk of infections and slow your recovery, so you will need to focus on controlling your diabetes during the weeks before surgery. . Make sure that the doctor who takes care of your diabetes knows about your planned surgery including the date and location.  How do I manage my blood sugar before surgery? . Check your blood sugar at least 4 times a day, starting 2 days before surgery, to make sure that the level is not too high or low. o Check your blood sugar the morning of your surgery when you wake up and every 2 hours until you get to the Short Stay unit. . If your blood sugar is less than 70 mg/dL, you will need to treat for low blood sugar: o Do not take insulin. o Treat a low blood sugar (less than 70 mg/dL) with  cup of clear juice (cranberry or apple), 4 glucose tablets, OR glucose gel. o Recheck blood sugar in 15 minutes after treatment (to make sure it is greater than 70 mg/dL). If your blood sugar is  not greater than 70 mg/dL on recheck, call [phone] for further instructions. . Report your blood sugar to the short stay nurse when you get to Short Stay.  . If you are admitted to the hospital after surgery: o Your blood sugar will be checked by the staff and you will probably be given insulin after surgery (instead of oral diabetes medicines) to make sure you have good blood sugar levels. o The goal for blood sugar control after surgery is 80-180 mg/dL.   WHAT DO I DO ABOUT MY DIABETES MEDICATION?  Marland Kitchen Do not take oral diabetes medicines (pills) the morning of surgery.  . THE DAY BEFORE SURGERY 02-16-17 TAKE YOUR METFORMIN AS USUAL. .       . THE MORNING OF SURGERY 02-17-18  DO NOT TAKE YOUR METFORMIN.                You may not have any metal on your body including hair pins and              piercings  Do not wear jewelry, make-up, lotions, powders or perfumes, deodorant             Do not wear nail polish.  Do not shave  48 hours prior to surgery.              Men may shave face and neck.   Do not bring valuables to the hospital. [place].  Contacts, dentures or bridgework may not be worn into surgery.  Leave suitcase in the car. After surgery it may be brought to your room.                  Please read over the following fact sheets you were given: _____________________________________________________________________  [facility] - Preparing for Surgery Before surgery, you can play an important role.  Because skin is not sterile, your skin needs to be as free of germs as possible.  You can reduce the number of germs on your skin by washing with CHG (chlorahexidine gluconate) soap before surgery.  CHG is an antiseptic cleaner which kills germs and bonds with the skin to continue killing germs even after washing. Please DO NOT use if you have an allergy to CHG or antibacterial soaps.  If your skin becomes reddened/irritated stop  using the CHG and inform your nurse when you arrive at Short Stay. Do not shave (including legs and underarms) for at least 48 hours prior to the first CHG shower.  You may shave your face/neck. Please follow these instructions carefully:  1.  Shower with CHG Soap the night before surgery and the  morning of Surgery.  2.  If you choose to wash your hair, wash your hair first as usual with your  normal  shampoo.  3.  After you shampoo, rinse your hair and body thoroughly to remove the  shampoo.                           4.  Use CHG as you would any other liquid soap.  You can apply chg directly  to the skin and wash                       Gently with a scrungie or clean washcloth.  5.  Apply the CHG Soap to your body ONLY FROM THE NECK DOWN.   Do not use on face/ open                           Wound or open sores. Avoid contact with eyes, ears mouth and genitals (private parts).                       Wash face,  Genitals (private parts) with your normal soap.             6.  Wash thoroughly, paying special attention to the area where your surgery  will be performed.  7.  Thoroughly rinse your body with warm water from the neck down.  8.  DO NOT shower/wash with your normal soap after using and rinsing off  the CHG Soap.                9.  Pat yourself dry with a clean towel.            10.  Wear clean pajamas.            11.  Place clean sheets on your bed the night of your first  shower and do not  sleep with pets. Day of Surgery : Do not apply any lotions/deodorants the morning of surgery.  Please wear clean clothes to the hospital/surgery center.  FAILURE TO FOLLOW THESE INSTRUCTIONS MAY RESULT IN THE CANCELLATION OF YOUR SURGERY PATIENT SIGNATURE_________________________________  NURSE SIGNATURE__________________________________  ________________________________________________________________________

## 2018-02-10 NOTE — Progress Notes (Signed)
LOV DR Florentina Jenny SMITH 12-28-17 Epic HEMAGLOBIN A1C 12-28-17 Epic  EKG 07-28-17 EPIC

## 2018-02-14 ENCOUNTER — Encounter (HOSPITAL_COMMUNITY): Payer: Self-pay

## 2018-02-14 ENCOUNTER — Encounter (HOSPITAL_COMMUNITY)
Admission: RE | Admit: 2018-02-14 | Discharge: 2018-02-14 | Disposition: A | Discharge status: Home / Self Care | Payer: Commercial Managed Care - PPO | Source: Ambulatory Visit | Attending: Urology | Admitting: Urology

## 2018-02-14 ENCOUNTER — Other Ambulatory Visit: Payer: Self-pay

## 2018-02-14 DIAGNOSIS — N393 Stress incontinence (female) (male): Secondary | ICD-10-CM | POA: Insufficient documentation

## 2018-02-14 DIAGNOSIS — Z01818 Encounter for other preprocedural examination: Secondary | ICD-10-CM | POA: Insufficient documentation

## 2018-02-14 HISTORY — DX: Type 2 diabetes mellitus without complications: E11.9

## 2018-02-14 HISTORY — DX: Family history of other specified conditions: Z84.89

## 2018-02-14 HISTORY — DX: Stress incontinence (female) (male): N39.3

## 2018-02-14 LAB — CBC
HEMATOCRIT: 40.1 % (ref 36.0–46.0)
HEMOGLOBIN: 12.9 g/dL (ref 12.0–15.0)
MCH: 28.5 pg (ref 26.0–34.0)
MCHC: 32.2 g/dL (ref 30.0–36.0)
MCV: 88.5 fL (ref 78.0–100.0)
Platelets: 284 10*3/uL (ref 150–400)
RBC: 4.53 MIL/uL (ref 3.87–5.11)
RDW: 13.4 % (ref 11.5–15.5)
WBC: 9.4 10*3/uL (ref 4.0–10.5)

## 2018-02-14 LAB — BASIC METABOLIC PANEL
ANION GAP: 12 (ref 5–15)
BUN: 21 mg/dL — AB (ref 6–20)
CO2: 21 mmol/L — ABNORMAL LOW (ref 22–32)
Calcium: 9.3 mg/dL (ref 8.9–10.3)
Chloride: 105 mmol/L (ref 101–111)
Creatinine, Ser: 0.86 mg/dL (ref 0.44–1.00)
Glucose, Bld: 179 mg/dL — ABNORMAL HIGH (ref 65–99)
POTASSIUM: 4.5 mmol/L (ref 3.5–5.1)
SODIUM: 138 mmol/L (ref 135–145)

## 2018-02-14 LAB — ABO/RH: ABO/RH(D): AB POS

## 2018-02-14 LAB — GLUCOSE, CAPILLARY: GLUCOSE-CAPILLARY: 178 mg/dL — AB (ref 65–99)

## 2018-02-14 LAB — HCG, SERUM, QUALITATIVE: Preg, Serum: NEGATIVE

## 2018-02-16 NOTE — H&P (Signed)
I was consulted by the above provider to assist the patient is urinary incontinence worsening over many years. It is not a daily problem. She primarily to his coughing sneezing or lifting and if she has a cold. She normally does not wear a pad but does when she is ill. She denies urge incontinence and bedwetting. She voids every 3-5 hours during the day and gets up once at night. She reports a good flow   She has a significant kidney stone history and is followed by Dr. Tresa Moore.   The patient on pelvic examination a grade 2 hypermobility the bladder neck and no stress incontinence or prolapse. She had a mild suburethral swelling but it did not think she had a diverticulum and would not need an MRI.   The patient primarily has stress incontinence. She had a mildly elevated residual urine volume of 127 mL. This may be a false positive and she does report a good flow. She will return for urodynamics. I will send a note the above provider   The patient would like to proceed with surgery if needed. I will get the urodynamics done in October for insurance reasons but I could not accommodate surgery this quickly. I will send a note to the above provider. We will recheck residual urine volumes and diabetic effects on bladder   Today  Frequency and incontinence are stable  on urodynamics the patient impede efficiently. Her bladder capacity was 800 mL. Her bladder was stable. Her Valsalva leak point pressure at 400 mL was 91 cm of water with mild to moderate leakage. Her cough leak point pressure was 135 cm of water so she had a severe leakage. Many leak point pressures are noted. She had difficulty to urinate. She voided 220 mL with a maximal flow of 41 mL/sec. She was straining. She did generate a bladder contraction a 6 cm of water but it was not well sustained. She did not empty efficiently initially. EMG activity was normal and the filling phase but then it was lost with some artifact. Bladder was a bit hypo  sensitive. Bladder neck descended 1 cm. I did not see evidence of a diverticulum. The details of the urodynamics are sign and dictated. her 2nd residual after using a urinal was 75 mL.   A picture was drawn. Watchful waiting versus behavioral therapy versus physical therapy discussed. A sling was discussed in detail. Mesh issues were described   I am concerned that she may have an element of a neurogenic bladder with diabetes with a tendency towards high residuals. Her risk for retention in my opinion is higher than most patients and based upon that risk and her mild incontinence nonsurgical options noted I think are referable. Surgery is also quite invasive for her baseline severity.   Also risk of pelvic pain discussed. In spite of patient's mild symptoms and possibly increased risk of retention she would like to proceed with surgery. She just saw Dr. Ronita Hipps for abnormal bleeding and would like to have the sling at the same time as a hysterectomy. We will arrange this     ALLERGIES: Hydrocodone - Nausea Percocet TABS - cold sweats Vicodin - Dizziness    MEDICATIONS: Allopurinol 100 mg tablet 1 tablet PO Daily  Metformin Hcl 500 mg tablet  Aspir 81  Atorvastatin Calcium 10 mg tablet  Vitamin D2     GU PSH: Complex cystometrogram, w/ void pressure and urethral pressure profile studies, any technique - 10/15/2017 Complex Uroflow - 10/15/2017 Cysto  Uretero Lithotripsy - 2013 Cystoscopy Insert Stent, Left - 07/28/2017, 2013, 2013, 2013 Emg surf Electrd - 10/15/2017 Inject For cystogram - 10/15/2017 Intrabd voidng Press - 10/15/2017 Ureteroscopic laser litho, Left - 08/11/2017 Ureteroscopic stone removal - 2013      PSH Notes: Cystoscopy With Ureteroscopy With Removal Of Calculus, Cystoscopy With Insertion Of Ureteral Stent Left, Cystoscopy With Ureteroscopy With Lithotripsy, Cystoscopy With Insertion Of Ureteral Stent Left, Cystoscopy With Insertion Of Ureteral Stent Left, Tubal Ligation,  Uterine Fibroid Embolization   NON-GU PSH: Laser Surgery Eye Tubal Ligation - 2013    GU PMH: Incomplete bladder emptying - 10/06/2017 Nocturia - 10/06/2017 Stress Incontinence (Worsening, Chronic) - 08/26/2017 Oth GU systems Signs/Symptoms, Bladder pain - 2014 Renal calculus, Nephrolithiasis - 2014 Ureteral calculus, Calculus of ureter - 2014    NON-GU PMH: Asthma, Asthma - 2014 Arthritis Diabetes Type 2 Encounter for general adult medical examination without abnormal findings, Encounter for preventive health examination Hypercholesterolemia    FAMILY HISTORY: 2 daughters - Daughter 1 son - Son nephrolithiasis - Father Prostate Cancer - Grandfather   SOCIAL HISTORY: Marital Status: Married Preferred Language: English; Ethnicity: Not Hispanic Or Latino; Race: White Has never drank.  Drinks 1 caffeinated drink per day. 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    REVIEW OF SYSTEMS:    GU Review Female:   Patient reports leakage of urine. Patient denies trouble starting your stream, frequent urination, get up at night to urinate, have to strain to urinate, burning /pain with urination, hard to postpone urination, being pregnant, and stream starts and stops.  Gastrointestinal (Upper):   Patient denies nausea, vomiting, and indigestion/ heartburn.  Gastrointestinal (Lower):   Patient denies diarrhea and constipation.  Constitutional:   Patient denies fever, night sweats, weight loss, and fatigue.  Skin:   Patient denies skin rash/ lesion and itching.  Eyes:   Patient denies blurred vision and double vision.  Ears/ Nose/ Throat:   Patient denies sore throat and sinus problems.  Hematologic/Lymphatic:   Patient denies swollen glands and easy bruising.  Cardiovascular:   Patient denies leg swelling and chest pains.  Respiratory:   Patient denies cough and shortness of breath.  Endocrine:   Patient denies excessive thirst.  Musculoskeletal:   Patient denies back pain and  joint pain.  Neurological:   Patient denies headaches and dizziness.  Psychologic:   Patient denies depression and anxiety.   VITAL SIGNS: None   PAST DATA REVIEWED:  Source Of History:  Patient   PROCEDURES:          Urinalysis w/Scope Dipstick Dipstick Cont'd Micro  Color: Yellow Bilirubin: Neg WBC/hpf: 0 - 5/hpf  Appearance: Cloudy Ketones: Neg RBC/hpf: 3 - 10/hpf  Specific Gravity: 1.025 Blood: Trace Bacteria: Mod (26-50/hpf)  pH: <=5.0 Protein: Trace Cystals: NS (Not Seen)  Glucose: Neg Urobilinogen: 0.2 Casts: NS (Not Seen)    Nitrites: Neg Trichomonas: Not Present    Leukocyte Esterase: 1+ Mucous: Not Present      Epithelial Cells: 10 - 20/hpf      Yeast: Few (1 - 5/hpf)      Sperm: Not Present    ASSESSMENT:      ICD-10 Details  1 GU:   Stress Incontinence - N39.3   2   Incomplete bladder emptying - R39.14      PLAN:           Schedule Return Visit/Planned Activity: Return PRN - Office Visit  Note: we will call patient   We talked about a sling in detail.  Pros, cons, general surgical and anesthetic risks, and other options including behavioral therapy and watchful waiting were discussed.  She understands that slings are generally successful in 90% of cases for stress incontinence, 50% for urge incontinence, and that in a small % of cases the incontinence can worsen. The risk of persistent, de novo, or worsening incontinence/dysfunction was discussed.  Risks were described but not limited to the discussion of injury to neighboring structures including the bowel (with possible life-threatening sepsis and colostomy), bladder, urethra, vagina (all resulting in further surgery), and ureter (resulting in re-implantation).  We also talked about the risk of retention requiring urethrolysis, extrusion requiring revision, and erosion resulting in further surgery.  Bleeding risks and transfusion rates and the risk of infection were discussed.  The risk of pelvic and  abdominal pain syndromes, dyspareunia, and neuropathies were discussed.  The need for CIC was described as well the usual post-operative course. The patient understands that she might not reach her treatment goal and that she might be worse following surgery.  After a thorough review of the management options for the patient's condition the patient  elected to proceed with surgical therapy as noted above. We have discussed the potential benefits and risks of the procedure, side effects of the proposed treatment, the likelihood of the patient achieving the goals of the procedure, and any potential problems that might occur during the procedure or recuperation. Informed consent has been obtained.

## 2018-02-16 NOTE — H&P (Signed)
NAME:  Whitney Shelton, RADI NO.:  [id number]  MEDICAL RECORD NO.:  [id number]  LOCATION:                                 FACILITY:  PHYSICIAN:  Whitney Jolynn, [name]     DATE OF BIRTH:  DATE OF ADMISSION: DATE OF DISCHARGE:                             HISTORY & PHYSICAL   CHIEF COMPLAINT:  Failed endometrial ablation.  HISTORY OF PRESENT ILLNESS:  She is a 52 year old white female, G3, P3, with menometrorrhagia and failed endometrial ablation for definitive third surgery.  The patient also with stress urinary incontinence to have separate procedure performed by Dr. Matilde Sprang at the same time subsequent to her scheduled gynecologic procedure.  She has medical history remarkable for migraine headaches, hypercholesterolemia and borderline hypertension.  MEDICATIONS:  Include: 1. Magnesium. 2. Fish oil. 3. Allopurinol. 4. Metformin. 5. Statin, and previously baby aspirin. She has allergies to HYDROCODONE and PERCOCET.  She is a nonsmoker, nondrinker.  She denies domestic or physical violence.  She has a family history of a maternal aunt with ovarian cancer, hypertension, pancreatic cancer, heart disease, oral cancer.  Past surgical history remarkable for kidney stone removal, hysteroscopy, and NovaSure ablation in 2005.  PHYSICAL EXAMINATION:  GENERAL:  Well-developed, well-nourished white female, no acute distress. HEENT:  Normal. NECK:  Supple.  Full range of motion. LUNGS:  Clear to auscultation. HEART:  Reveals a regular rate and rhythm. ABDOMEN:  Soft, nontender. PELVIC:  Reveals a bulky mid-positioned anteflexed uterus with no adnexal masses appreciated. EXTREMITIES:  There are no cords. NEUROLOGIC:  Nonfocal. SKIN:  Intact.  IMPRESSION:  Menometrorrhagia with uterine fibroids and failed endometrial ablation.  PLAN:  Proceed with da Vinci total laparoscopic hysterectomy, bilateral salpingectomy.  Risks of anesthesia, infection, bleeding,  injury to surrounding organs, possible need for repair discussed, delayed versus immediate complications to include bowel and bladder injury noted in separate dictation per Dr. Matilde Sprang for separately scheduled procedure to be done at the same time.     Whitney Shelton, [name]     RJT/MEDQ  D:  02/16/2018  T:  02/16/2018  Job:  [id number]  cc:   Whitney Pamlea, [name] Fax: [phone]

## 2018-02-17 ENCOUNTER — Encounter (HOSPITAL_COMMUNITY): Payer: Self-pay

## 2018-02-17 ENCOUNTER — Ambulatory Visit (HOSPITAL_COMMUNITY): Payer: Commercial Managed Care - PPO | Admitting: Anesthesiology

## 2018-02-17 ENCOUNTER — Encounter (HOSPITAL_BASED_OUTPATIENT_CLINIC_OR_DEPARTMENT_OTHER)
Admission: RE | Disposition: A | Discharge status: Home / Self Care | Payer: Self-pay | Source: Ambulatory Visit | Attending: Obstetrics and Gynecology

## 2018-02-17 ENCOUNTER — Ambulatory Visit (HOSPITAL_COMMUNITY)
Admission: RE | Admit: 2018-02-17 | Discharge: 2018-02-18 | Disposition: A | Discharge status: Home / Self Care | Payer: Commercial Managed Care - PPO | Source: Ambulatory Visit | Attending: Obstetrics and Gynecology | Admitting: Obstetrics and Gynecology

## 2018-02-17 DIAGNOSIS — N815 Vaginal enterocele: Secondary | ICD-10-CM | POA: Diagnosis not present

## 2018-02-17 DIAGNOSIS — D251 Intramural leiomyoma of uterus: Secondary | ICD-10-CM | POA: Diagnosis not present

## 2018-02-17 DIAGNOSIS — N393 Stress incontinence (female) (male): Secondary | ICD-10-CM | POA: Insufficient documentation

## 2018-02-17 DIAGNOSIS — Z885 Allergy status to narcotic agent status: Secondary | ICD-10-CM | POA: Insufficient documentation

## 2018-02-17 DIAGNOSIS — Z79899 Other long term (current) drug therapy: Secondary | ICD-10-CM | POA: Diagnosis not present

## 2018-02-17 DIAGNOSIS — N888 Other specified noninflammatory disorders of cervix uteri: Secondary | ICD-10-CM | POA: Insufficient documentation

## 2018-02-17 DIAGNOSIS — E7800 Pure hypercholesterolemia, unspecified: Secondary | ICD-10-CM | POA: Insufficient documentation

## 2018-02-17 DIAGNOSIS — Z7984 Long term (current) use of oral hypoglycemic drugs: Secondary | ICD-10-CM | POA: Insufficient documentation

## 2018-02-17 DIAGNOSIS — N921 Excessive and frequent menstruation with irregular cycle: Secondary | ICD-10-CM | POA: Insufficient documentation

## 2018-02-17 DIAGNOSIS — E119 Type 2 diabetes mellitus without complications: Secondary | ICD-10-CM | POA: Diagnosis not present

## 2018-02-17 DIAGNOSIS — N800 Endometriosis of uterus: Secondary | ICD-10-CM | POA: Insufficient documentation

## 2018-02-17 DIAGNOSIS — N736 Female pelvic peritoneal adhesions (postinfective): Secondary | ICD-10-CM | POA: Insufficient documentation

## 2018-02-17 DIAGNOSIS — N920 Excessive and frequent menstruation with regular cycle: Secondary | ICD-10-CM | POA: Diagnosis present

## 2018-02-17 HISTORY — PX: ROBOTIC ASSISTED TOTAL HYSTERECTOMY: SHX6085

## 2018-02-17 HISTORY — PX: CYSTOSCOPY: SHX5120

## 2018-02-17 HISTORY — PX: PUBOVAGINAL SLING: SHX1035

## 2018-02-17 LAB — GLUCOSE, CAPILLARY
GLUCOSE-CAPILLARY: 168 mg/dL — AB (ref 65–99)
GLUCOSE-CAPILLARY: 224 mg/dL — AB (ref 65–99)
GLUCOSE-CAPILLARY: 219 mg/dL — AB (ref 65–99)
Glucose-Capillary: 204 mg/dL — ABNORMAL HIGH (ref 65–99)

## 2018-02-17 LAB — TYPE AND SCREEN
ABO/RH(D): AB POS
Antibody Screen: NEGATIVE

## 2018-02-17 SURGERY — HYSTERECTOMY, TOTAL, ROBOT-ASSISTED
Anesthesia: General | Site: Vagina

## 2018-02-17 MED ORDER — INDIGOTINDISULFONATE SODIUM 8 MG/ML IJ SOLN
INTRAMUSCULAR | Status: AC
  Filled 2018-02-17: qty 10

## 2018-02-17 MED ORDER — KETOROLAC TROMETHAMINE 30 MG/ML IJ SOLN
INTRAMUSCULAR | Status: AC
  Filled 2018-02-17: qty 1

## 2018-02-17 MED ORDER — BUPIVACAINE HCL (PF) 0.25 % IJ SOLN
INTRAMUSCULAR | Status: DC | PRN
  Administered 2018-02-17: 7 mL

## 2018-02-17 MED ORDER — LIDOCAINE 2% (20 MG/ML) 5 ML SYRINGE
INTRAMUSCULAR | Status: AC
  Filled 2018-02-17: qty 5

## 2018-02-17 MED ORDER — LIDOCAINE HCL 2 % EX GEL
CUTANEOUS | Status: AC
  Filled 2018-02-17: qty 5

## 2018-02-17 MED ORDER — CIPROFLOXACIN IN D5W 400 MG/200ML IV SOLN
INTRAVENOUS | Status: DC | PRN
  Administered 2018-02-17: 400 mg via INTRAVENOUS

## 2018-02-17 MED ORDER — SUGAMMADEX SODIUM 200 MG/2ML IV SOLN
INTRAVENOUS | Status: DC | PRN
  Administered 2018-02-17: 200 mg via INTRAVENOUS

## 2018-02-17 MED ORDER — DIPHENHYDRAMINE HCL 50 MG/ML IJ SOLN
12.5000 mg | Freq: Four times a day (QID) | INTRAMUSCULAR | Status: DC | PRN
  Filled 2018-02-17: qty 0.25

## 2018-02-17 MED ORDER — CIPROFLOXACIN IN D5W 400 MG/200ML IV SOLN
INTRAVENOUS | Status: AC
  Filled 2018-02-17: qty 200

## 2018-02-17 MED ORDER — KETOROLAC TROMETHAMINE 30 MG/ML IJ SOLN: 30.0000 mg | Freq: Once | INTRAMUSCULAR | Status: DC | PRN

## 2018-02-17 MED ORDER — INSULIN ASPART 100 UNIT/ML ~~LOC~~ SOLN
0.0000 [IU] | Freq: Three times a day (TID) | SUBCUTANEOUS | Status: DC
  Administered 2018-02-17: 3 [IU] via SUBCUTANEOUS
  Administered 2018-02-18: 2 [IU] via SUBCUTANEOUS
  Filled 2018-02-17: qty 0.09

## 2018-02-17 MED ORDER — SODIUM CHLORIDE 0.9 % IJ SOLN
INTRAMUSCULAR | Status: AC
  Filled 2018-02-17: qty 50

## 2018-02-17 MED ORDER — ROCURONIUM BROMIDE 50 MG/5ML IV SOSY
PREFILLED_SYRINGE | INTRAVENOUS | Status: DC | PRN
  Administered 2018-02-17: 70 mg via INTRAVENOUS
  Administered 2018-02-17: 20 mg via INTRAVENOUS

## 2018-02-17 MED ORDER — HYDROMORPHONE HCL 2 MG PO TABS
ORAL_TABLET | ORAL | Status: AC
  Filled 2018-02-17: qty 1

## 2018-02-17 MED ORDER — PROPOFOL 10 MG/ML IV BOLUS
INTRAVENOUS | Status: DC | PRN
  Administered 2018-02-17: 200 mg via INTRAVENOUS

## 2018-02-17 MED ORDER — ESTRADIOL 0.1 MG/GM VA CREA
TOPICAL_CREAM | VAGINAL | Status: DC | PRN
  Administered 2018-02-17: 1 via VAGINAL

## 2018-02-17 MED ORDER — NALOXONE HCL 0.4 MG/ML IJ SOLN
0.4000 mg | INTRAMUSCULAR | Status: DC | PRN
  Filled 2018-02-17: qty 1

## 2018-02-17 MED ORDER — STERILE WATER FOR IRRIGATION IR SOLN
Status: DC | PRN
  Administered 2018-02-17: 3000 mL via INTRAVESICAL

## 2018-02-17 MED ORDER — PROMETHAZINE HCL 25 MG/ML IJ SOLN
6.2500 mg | INTRAMUSCULAR | Status: DC | PRN
  Administered 2018-02-17: 12.5 mg via INTRAVENOUS

## 2018-02-17 MED ORDER — ARTIFICIAL TEARS OPHTHALMIC OINT
TOPICAL_OINTMENT | OPHTHALMIC | Status: AC
  Filled 2018-02-17: qty 3.5

## 2018-02-17 MED ORDER — METFORMIN HCL ER 500 MG PO TB24
500.0000 mg | ORAL_TABLET | Freq: Every day | ORAL | Status: DC
  Administered 2018-02-17: 500 mg via ORAL
  Filled 2018-02-17: qty 1

## 2018-02-17 MED ORDER — ALLOPURINOL 100 MG PO TABS
100.0000 mg | ORAL_TABLET | Freq: Every day | ORAL | Status: DC
  Administered 2018-02-17: 100 mg via ORAL
  Filled 2018-02-17: qty 1

## 2018-02-17 MED ORDER — DEXAMETHASONE SODIUM PHOSPHATE 10 MG/ML IJ SOLN
INTRAMUSCULAR | Status: AC
  Filled 2018-02-17: qty 1

## 2018-02-17 MED ORDER — SODIUM CHLORIDE 0.9 % IR SOLN
Status: DC | PRN
  Administered 2018-02-17: 3000 mL

## 2018-02-17 MED ORDER — KETOROLAC TROMETHAMINE 30 MG/ML IJ SOLN
INTRAMUSCULAR | Status: DC | PRN
  Administered 2018-02-17: 30 mg via INTRAVENOUS

## 2018-02-17 MED ORDER — TRAMADOL HCL 50 MG PO TABS
50.0000 mg | ORAL_TABLET | Freq: Four times a day (QID) | ORAL | Status: DC | PRN
  Administered 2018-02-17 – 2018-02-18 (×2): 50 mg via ORAL
  Filled 2018-02-17 (×2): qty 1

## 2018-02-17 MED ORDER — ATORVASTATIN CALCIUM 10 MG PO TABS
10.0000 mg | ORAL_TABLET | Freq: Every day | ORAL | Status: DC
  Administered 2018-02-17: 10 mg via ORAL
  Filled 2018-02-17: qty 1

## 2018-02-17 MED ORDER — SUGAMMADEX SODIUM 200 MG/2ML IV SOLN
INTRAVENOUS | Status: AC
  Filled 2018-02-17: qty 2

## 2018-02-17 MED ORDER — ONDANSETRON HCL 4 MG/2ML IJ SOLN
INTRAMUSCULAR | Status: AC
  Filled 2018-02-17: qty 2

## 2018-02-17 MED ORDER — SODIUM CHLORIDE 0.9% FLUSH
9.0000 mL | INTRAVENOUS | Status: DC | PRN
  Filled 2018-02-17: qty 10

## 2018-02-17 MED ORDER — PHENAZOPYRIDINE HCL 200 MG PO TABS
200.0000 mg | ORAL_TABLET | Freq: Once | ORAL | Status: AC
  Administered 2018-02-17: 200 mg via ORAL
  Filled 2018-02-17: qty 1

## 2018-02-17 MED ORDER — MENTHOL 3 MG MT LOZG
1.0000 | LOZENGE | OROMUCOSAL | Status: DC | PRN
  Administered 2018-02-17: 3 mg via ORAL
  Filled 2018-02-17: qty 9

## 2018-02-17 MED ORDER — ROCURONIUM BROMIDE 10 MG/ML (PF) SYRINGE
PREFILLED_SYRINGE | INTRAVENOUS | Status: AC
  Filled 2018-02-17: qty 5

## 2018-02-17 MED ORDER — MIDAZOLAM HCL 2 MG/2ML IJ SOLN
INTRAMUSCULAR | Status: AC
  Filled 2018-02-17: qty 2

## 2018-02-17 MED ORDER — HYDROMORPHONE 1 MG/ML IV SOLN
INTRAVENOUS | Status: DC
  Administered 2018-02-17: 12:00:00 via INTRAVENOUS
  Administered 2018-02-17 (×2): 0.6 mg via INTRAVENOUS
  Administered 2018-02-17: 0.3 mg via INTRAVENOUS
  Filled 2018-02-17: qty 25

## 2018-02-17 MED ORDER — INSULIN ASPART 100 UNIT/ML ~~LOC~~ SOLN
SUBCUTANEOUS | Status: AC
  Filled 2018-02-17: qty 1

## 2018-02-17 MED ORDER — SODIUM CHLORIDE 0.9 % IV SOLN
INTRAVENOUS | Status: AC
  Filled 2018-02-17: qty 500000

## 2018-02-17 MED ORDER — LIDOCAINE 2% (20 MG/ML) 5 ML SYRINGE
INTRAMUSCULAR | Status: DC | PRN
  Administered 2018-02-17: 60 mg via INTRAVENOUS

## 2018-02-17 MED ORDER — CEFAZOLIN SODIUM-DEXTROSE 2-4 GM/100ML-% IV SOLN
2.0000 g | INTRAVENOUS | Status: AC
  Administered 2018-02-17: 2 g via INTRAVENOUS
  Filled 2018-02-17: qty 100

## 2018-02-17 MED ORDER — LACTATED RINGERS IV SOLN
INTRAVENOUS | Status: DC
  Filled 2018-02-17: qty 1000

## 2018-02-17 MED ORDER — ROPIVACAINE HCL 5 MG/ML IJ SOLN
INTRAMUSCULAR | Status: AC
  Filled 2018-02-17: qty 30

## 2018-02-17 MED ORDER — LACTATED RINGERS IV SOLN
INTRAVENOUS | Status: DC | PRN
  Administered 2018-02-17 (×2): via INTRAVENOUS

## 2018-02-17 MED ORDER — SODIUM CHLORIDE 0.9 % IJ SOLN
INTRAMUSCULAR | Status: DC | PRN
  Administered 2018-02-17: 10 mL

## 2018-02-17 MED ORDER — TRAMADOL HCL 50 MG PO TABS
ORAL_TABLET | ORAL | Status: AC
  Filled 2018-02-17: qty 1

## 2018-02-17 MED ORDER — ONDANSETRON HCL 4 MG/2ML IJ SOLN
INTRAMUSCULAR | Status: DC | PRN
  Administered 2018-02-17: 4 mg via INTRAVENOUS

## 2018-02-17 MED ORDER — MIDAZOLAM HCL 5 MG/5ML IJ SOLN
INTRAMUSCULAR | Status: DC | PRN
  Administered 2018-02-17: 2 mg via INTRAVENOUS

## 2018-02-17 MED ORDER — DEXAMETHASONE SODIUM PHOSPHATE 10 MG/ML IJ SOLN
INTRAMUSCULAR | Status: DC | PRN
  Administered 2018-02-17: 10 mg via INTRAVENOUS

## 2018-02-17 MED ORDER — LIDOCAINE-EPINEPHRINE (PF) 1 %-1:200000 IJ SOLN
INTRAMUSCULAR | Status: DC | PRN
  Administered 2018-02-17: 7 mL

## 2018-02-17 MED ORDER — HYDROMORPHONE HCL 1 MG/ML IJ SOLN: 0.2500 mg | INTRAMUSCULAR | Status: DC | PRN

## 2018-02-17 MED ORDER — ONDANSETRON HCL 4 MG/2ML IJ SOLN
4.0000 mg | Freq: Four times a day (QID) | INTRAMUSCULAR | Status: DC | PRN
  Filled 2018-02-17: qty 2

## 2018-02-17 MED ORDER — FENTANYL CITRATE (PF) 100 MCG/2ML IJ SOLN
INTRAMUSCULAR | Status: DC | PRN
  Administered 2018-02-17: 50 ug via INTRAVENOUS
  Administered 2018-02-17 (×2): 25 ug via INTRAVENOUS
  Administered 2018-02-17: 50 ug via INTRAVENOUS
  Administered 2018-02-17: 100 ug via INTRAVENOUS

## 2018-02-17 MED ORDER — HYDROMORPHONE HCL 2 MG PO TABS
2.0000 mg | ORAL_TABLET | ORAL | Status: DC | PRN
  Administered 2018-02-17 – 2018-02-18 (×3): 2 mg via ORAL
  Filled 2018-02-17 (×2): qty 1

## 2018-02-17 MED ORDER — PROMETHAZINE HCL 25 MG/ML IJ SOLN
INTRAMUSCULAR | Status: AC
  Filled 2018-02-17: qty 1

## 2018-02-17 MED ORDER — ALBUTEROL SULFATE HFA 108 (90 BASE) MCG/ACT IN AERS
1.0000 | INHALATION_SPRAY | RESPIRATORY_TRACT | Status: DC | PRN
  Filled 2018-02-17: qty 6.7

## 2018-02-17 MED ORDER — ESTRADIOL 0.1 MG/GM VA CREA
TOPICAL_CREAM | VAGINAL | Status: AC
  Filled 2018-02-17: qty 42.5

## 2018-02-17 MED ORDER — PROPOFOL 10 MG/ML IV BOLUS
INTRAVENOUS | Status: AC
  Filled 2018-02-17: qty 40

## 2018-02-17 MED ORDER — INSULIN ASPART 100 UNIT/ML ~~LOC~~ SOLN
3.0000 [IU] | Freq: Once | SUBCUTANEOUS | Status: AC
  Administered 2018-02-17: 3 [IU] via SUBCUTANEOUS

## 2018-02-17 MED ORDER — FENTANYL CITRATE (PF) 250 MCG/5ML IJ SOLN
INTRAMUSCULAR | Status: AC
  Filled 2018-02-17: qty 5

## 2018-02-17 MED ORDER — SODIUM CHLORIDE 0.9 % IV SOLN
INTRAVENOUS | Status: DC | PRN
  Administered 2018-02-17: 60 mL

## 2018-02-17 MED ORDER — LIDOCAINE-EPINEPHRINE (PF) 1 %-1:200000 IJ SOLN
INTRAMUSCULAR | Status: AC
  Filled 2018-02-17: qty 30

## 2018-02-17 MED ORDER — DIPHENHYDRAMINE HCL 12.5 MG/5ML PO ELIX
12.5000 mg | ORAL_SOLUTION | Freq: Four times a day (QID) | ORAL | Status: DC | PRN
  Filled 2018-02-17: qty 5

## 2018-02-17 MED ORDER — MENTHOL 3 MG MT LOZG
LOZENGE | OROMUCOSAL | Status: AC
  Filled 2018-02-17: qty 9

## 2018-02-17 MED ORDER — BUPIVACAINE HCL (PF) 0.25 % IJ SOLN
INTRAMUSCULAR | Status: AC
  Filled 2018-02-17: qty 30

## 2018-02-17 SURGICAL SUPPLY — 107 items
BAG URINE DRAINAGE (UROLOGICAL SUPPLIES) ×5 IMPLANT
BAG URO CATCHER STRL LF (MISCELLANEOUS) ×5 IMPLANT
BALLN NEPHROSTOMY (BALLOONS)
BALLOON NEPHROSTOMY (BALLOONS) IMPLANT
BARRIER ADHS 3X4 INTERCEED (GAUZE/BANDAGES/DRESSINGS) IMPLANT
BLADE HEX COATED 2.75 (ELECTRODE) ×5 IMPLANT
BLADE SURG 15 STRL LF DISP TIS (BLADE) ×3 IMPLANT
BLADE SURG 15 STRL SS (BLADE) ×2
CANISTER SUCT 3000ML PPV (MISCELLANEOUS) ×5 IMPLANT
CATH FOLEY 2W COUNCIL 5CC 18FR (CATHETERS) IMPLANT
CATH FOLEY 2WAY SLVR  5CC 14FR (CATHETERS) ×2
CATH FOLEY 2WAY SLVR 5CC 14FR (CATHETERS) ×3 IMPLANT
CATH FOLEY 3WAY  5CC 16FR (CATHETERS) ×2
CATH FOLEY 3WAY 5CC 16FR (CATHETERS) ×3 IMPLANT
CLOTH BEACON ORANGE TIMEOUT ST (SAFETY) ×5 IMPLANT
COVER BACK TABLE 60X90IN (DRAPES) ×5 IMPLANT
COVER FOOTSWITCH UNIV (MISCELLANEOUS) IMPLANT
COVER MAYO STAND STRL (DRAPES) ×5 IMPLANT
COVER SURGICAL LIGHT HANDLE (MISCELLANEOUS) ×5 IMPLANT
COVER TIP SHEARS 8 DVNC (MISCELLANEOUS) ×3 IMPLANT
COVER TIP SHEARS 8MM DA VINCI (MISCELLANEOUS) ×2
DECANTER SPIKE VIAL GLASS SM (MISCELLANEOUS) ×20 IMPLANT
DEFOGGER SCOPE WARMER CLEARIFY (MISCELLANEOUS) ×5 IMPLANT
DERMABOND ADVANCED (GAUZE/BANDAGES/DRESSINGS) ×4
DERMABOND ADVANCED .7 DNX12 (GAUZE/BANDAGES/DRESSINGS) ×6 IMPLANT
DRAIN PENROSE 18X1/4 LTX STRL (WOUND CARE) IMPLANT
DRAPE ARM DVNC X/XI (DISPOSABLE) ×12 IMPLANT
DRAPE COLUMN DVNC XI (DISPOSABLE) ×3 IMPLANT
DRAPE DA VINCI XI ARM (DISPOSABLE) ×8
DRAPE DA VINCI XI COLUMN (DISPOSABLE) ×2
DRAPE SHEET LG 3/4 BI-LAMINATE (DRAPES) ×5 IMPLANT
DURAPREP 26ML APPLICATOR (WOUND CARE) ×5 IMPLANT
ELECT PENCIL ROCKER SW 15FT (MISCELLANEOUS) ×5 IMPLANT
ELECT REM PT RETURN 15FT ADLT (MISCELLANEOUS) ×5 IMPLANT
GAUZE PACKING 2X5 YD STRL (GAUZE/BANDAGES/DRESSINGS) ×5 IMPLANT
GAUZE SPONGE 4X4 16PLY XRAY LF (GAUZE/BANDAGES/DRESSINGS) ×10 IMPLANT
GLOVE BIO SURGEON STRL SZ7.5 (GLOVE) ×15 IMPLANT
GLOVE BIOGEL M STRL SZ7.5 (GLOVE) ×15 IMPLANT
GLOVE BIOGEL PI IND STRL 7.0 (GLOVE) ×9 IMPLANT
GLOVE BIOGEL PI INDICATOR 7.0 (GLOVE) ×6
GLOVE ECLIPSE 8.0 STRL XLNG CF (GLOVE) ×10 IMPLANT
GOWN STRL REUS W/TWL XL LVL3 (GOWN DISPOSABLE) ×5 IMPLANT
HOLDER FOLEY CATH W/STRAP (MISCELLANEOUS) IMPLANT
IRRIG SUCT STRYKERFLOW 2 WTIP (MISCELLANEOUS) ×5
IRRIGATION SUCT STRKRFLW 2 WTP (MISCELLANEOUS) ×3 IMPLANT
KIT BASIN OR (CUSTOM PROCEDURE TRAY) ×5 IMPLANT
LEGGING LITHOTOMY PAIR STRL (DRAPES) ×10 IMPLANT
NEEDLE HYPO 22GX1.5 SAFETY (NEEDLE) IMPLANT
NEEDLE INSUFFLATION 120MM (ENDOMECHANICALS) ×5 IMPLANT
NEEDLE INSUFFLATION 14GA 120MM (NEEDLE) ×5 IMPLANT
NEEDLE INSUFFLATION 150MM (ENDOMECHANICALS) ×5 IMPLANT
NEEDLE MAYO 6 CRC TAPER PT (NEEDLE) IMPLANT
OBTURATOR OPTICAL STANDARD 8MM (TROCAR)
OBTURATOR OPTICAL STND 8 DVNC (TROCAR)
OBTURATOR OPTICALSTD 8 DVNC (TROCAR) IMPLANT
OCCLUDER COLPOPNEUMO (BALLOONS) ×5 IMPLANT
PACK CYSTO (CUSTOM PROCEDURE TRAY) ×5 IMPLANT
PACK ROBOT WH (CUSTOM PROCEDURE TRAY) ×5 IMPLANT
PACK ROBOTIC GOWN (GOWN DISPOSABLE) ×5 IMPLANT
PACK TRENDGUARD 450 HYBRID PRO (MISCELLANEOUS) ×3 IMPLANT
PACK TRENDGUARD 600 HYBRD PROC (MISCELLANEOUS) IMPLANT
PAD PREP 24X48 CUFFED NSTRL (MISCELLANEOUS) ×5 IMPLANT
PLUG CATH AND CAP STER (CATHETERS) ×5 IMPLANT
PROTECTOR NERVE ULNAR (MISCELLANEOUS) ×10 IMPLANT
RETRACTOR STAY HOOK 5MM (MISCELLANEOUS) IMPLANT
SEAL CANN UNIV 5-8 DVNC XI (MISCELLANEOUS) ×9 IMPLANT
SEAL XI 5MM-8MM UNIVERSAL (MISCELLANEOUS) ×6
SEALER VESSEL DA VINCI XI (MISCELLANEOUS) ×2
SEALER VESSEL EXT DVNC XI (MISCELLANEOUS) ×3 IMPLANT
SET CYSTO W/LG BORE CLAMP LF (SET/KITS/TRAYS/PACK) ×5 IMPLANT
SET TRI-LUMEN FLTR TB AIRSEAL (TUBING) ×10 IMPLANT
SHEET LAVH (DRAPES) IMPLANT
SLING SUPRIS RETROPUBIC KIT (Miscellaneous) ×5 IMPLANT
SUT CAPIO ETHIBPND (SUTURE) IMPLANT
SUT CAPIO POLYGLYCOLIC (SUTURE) IMPLANT
SUT ETHIBOND 0 (SUTURE) IMPLANT
SUT SILK 2 0 SH (SUTURE) IMPLANT
SUT VIC AB 0 CT1 27 (SUTURE) ×4
SUT VIC AB 0 CT1 27XBRD ANBCTR (SUTURE) ×6 IMPLANT
SUT VIC AB 0 CT1 27XBRD ANTBC (SUTURE) IMPLANT
SUT VIC AB 2-0 CT1 27 (SUTURE) ×2
SUT VIC AB 2-0 CT1 27XBRD (SUTURE) ×3 IMPLANT
SUT VIC AB 2-0 SH 27 (SUTURE) ×4
SUT VIC AB 2-0 SH 27X BRD (SUTURE) ×6 IMPLANT
SUT VIC AB 3-0 SH 27 (SUTURE)
SUT VIC AB 3-0 SH 27XBRD (SUTURE) IMPLANT
SUT VIC AB 4-0 PS2 27 (SUTURE) ×5 IMPLANT
SUT VICRYL 0 UR6 27IN ABS (SUTURE) ×5 IMPLANT
SUT VICRYL RAPIDE 4/0 PS 2 (SUTURE) ×10 IMPLANT
SUT VLOC 180 0 9IN  GS21 (SUTURE) ×4
SUT VLOC 180 0 9IN GS21 (SUTURE) ×6 IMPLANT
SYR 10ML LL (SYRINGE) ×5 IMPLANT
TIP RUMI ORANGE 6.7MMX12CM (TIP) IMPLANT
TIP UTERINE 5.1X6CM LAV DISP (MISCELLANEOUS) IMPLANT
TIP UTERINE 6.7X10CM GRN DISP (MISCELLANEOUS) IMPLANT
TIP UTERINE 6.7X6CM WHT DISP (MISCELLANEOUS) IMPLANT
TIP UTERINE 6.7X8CM BLUE DISP (MISCELLANEOUS) ×5 IMPLANT
TOWEL NATURAL 10PK STERILE (DISPOSABLE) ×5 IMPLANT
TOWEL OR 17X24 6PK STRL BLUE (TOWEL DISPOSABLE) ×15 IMPLANT
TOWEL OR NON WOVEN STRL DISP B (DISPOSABLE) ×5 IMPLANT
TRENDGUARD 450 HYBRID PRO PACK (MISCELLANEOUS) ×5
TRENDGUARD 600 HYBRID PROC PK (MISCELLANEOUS)
TROCAR PORT AIRSEAL 5X120 (TROCAR) ×10 IMPLANT
TUBING CONNECTING 10 (TUBING) IMPLANT
TUBING CONNECTING 10' (TUBING)
WATER STERILE IRR 1000ML POUR (IV SOLUTION) ×10 IMPLANT
YANKAUER SUCT BULB TIP 10FT TU (MISCELLANEOUS) IMPLANT

## 2018-02-17 NOTE — Progress Notes (Signed)
Patient seen and examined. Consent witnessed and signed. No changes noted. Update completed. 

## 2018-02-17 NOTE — Anesthesia Preprocedure Evaluation (Signed)
Anesthesia Evaluation  Patient identified by MRN, date of birth, ID band Patient awake    Reviewed: Allergy & Precautions, NPO status , Patient's Chart, lab work & pertinent test results  Airway Mallampati: II  TM Distance: >3 FB Neck ROM: Full    Dental no notable dental hx.    Pulmonary neg pulmonary ROS,    Pulmonary exam normal breath sounds clear to auscultation       Cardiovascular negative cardio ROS Normal cardiovascular exam Rhythm:Regular Rate:Normal     Neuro/Psych negative neurological ROS  negative psych ROS   GI/Hepatic negative GI ROS, Neg liver ROS,   Endo/Other  diabetes  Renal/GU negative Renal ROS  negative genitourinary   Musculoskeletal negative musculoskeletal ROS (+)   Abdominal   Peds negative pediatric ROS (+)  Hematology negative hematology ROS (+)   Anesthesia Other Findings   Reproductive/Obstetrics negative OB ROS                             Anesthesia Physical Anesthesia Plan  ASA: II  Anesthesia Plan: General   Post-op Pain Management:    Induction: Intravenous  PONV Risk Score and Plan: 3 and Ondansetron, Dexamethasone, Midazolam and Treatment may vary due to age or medical condition  Airway Management Planned: Oral ETT  Additional Equipment:   Intra-op Plan:   Post-operative Plan: Extubation in OR  Informed Consent: I have reviewed the patients History and Physical, chart, labs and discussed the procedure including the risks, benefits and alternatives for the proposed anesthesia with the patient or authorized representative who has indicated his/her understanding and acceptance.   Dental advisory given  Plan Discussed with: CRNA and Surgeon  Anesthesia Plan Comments:         Anesthesia Quick Evaluation

## 2018-02-17 NOTE — Interval H&P Note (Signed)
History and Physical Interval Note:  02/17/2018 7:35 AM  Whitney Shelton  has presented today for surgery, with the diagnosis of Menorrhagia, Failed Ablation  The various methods of treatment have been discussed with the patient and family. After consideration of risks, benefits and other options for treatment, the patient has consented to  Procedure(s) with comments: XI ROBOTIC ASSISTED TOTAL HYSTERECTOMY WITH SALPINGECTOMY (Bilateral) - Patient Class:  Outpatient in a bed. PUBO-VAGINAL SLING (N/A) CYSTOSCOPY (N/A) as a surgical intervention .  The patient's history has been reviewed, patient examined, no change in status, stable for surgery.  I have reviewed the patient's chart and labs.  Questions were answered to the patient's satisfaction.     [name] A

## 2018-02-17 NOTE — Anesthesia Postprocedure Evaluation (Signed)
Anesthesia Post Note  Patient: Whitney Shelton  Procedure(s) Performed: XI ROBOTIC ASSISTED TOTAL HYSTERECTOMY WITH SALPINGECTOMY (Bilateral Abdomen) PUBO-VAGINAL SLING (N/A Vagina ) CYSTOSCOPY (N/A Bladder)     Patient location during evaluation: PACU Anesthesia Type: General Level of consciousness: awake and alert Pain management: pain level controlled Vital Signs Assessment: post-procedure vital signs reviewed and stable Respiratory status: spontaneous breathing, nonlabored ventilation, respiratory function stable and patient connected to nasal cannula oxygen Cardiovascular status: blood pressure returned to baseline and stable Postop Assessment: no apparent nausea or vomiting Anesthetic complications: no    Last Vitals:  Vitals:   02/17/18 0608 02/17/18 1115  BP: (!) 157/95 97/66  Pulse: 72 82  Resp:  18  Temp: 36.5 C 36.9 C  SpO2: 100% 93%    Last Pain:  Vitals:   02/17/18 1115  TempSrc:   PainSc: Asleep                 Karysa Heft S

## 2018-02-17 NOTE — Op Note (Signed)
Preoperative diagnosis: Stress urinary incontinence Postoperative diagnosis: Stress urinary incontinence Surgery: Sling cystourethropexy and cystoscopy Surgeon: Dr. Nicki Reaper Jencarlos Nicolson  The patient has the above diagnosis and consented to the above procedure.  She had a hysterectomy.  She was given preoperative antibiotics.  Extra care with gynecology leg positioning with retractors were inserted.  She had no obvious diverticulum noted.  I made 2 1 cm incisions 1.5 cm lateral to the midline 1 fingerbreadth above the symphysis pubis.  I was very careful in marking a 2 cm suburethral incision under the mid urethra.  I instilled 4 cc of lidocaine epinephrine mixture.  I made the appropriate depth incision.  I mobilized to the urethrovesical angle bilaterally with scissors  The bladder emptied I placed a trocar on top of and along the back of the symphysis pubis under the pulp of my index finger bilaterally with my box technique.  I cystoscoped the patient.  There was no injury to bladder or urethra.  Ureters effluxed well.  There is no movement of bladder with movement of the trocar  Of the bladder empty with the described technique I attached the mesh and brought up through the retropubic space.  I tensioned it over the fat part of a moderate size Kelly clamp.  There was appropriate hypermobility with no spring back effect.  Is very pleased with the tension and position of the sling  There was some bleeding from the endopelvic fascia on the right side easily controlled  I closed the anterior vaginal wall with running 2-0 Vicryl on a CT1 needle followed by 2 interrupted sutures.  I cut the mesh below the skin and closed each incision with interrupted 4-0 Vicryl and Dermabond.  Vaginal pack with estrogen cream applied  The patient will be followed as per protocol and hopefully it reaches her treatment goal

## 2018-02-17 NOTE — Anesthesia Procedure Notes (Signed)
Procedure Name: Intubation Date/Time: 02/17/2018 7:46 AM Performed by: Bonney Aid, CRNA Pre-anesthesia Checklist: Patient identified, Emergency Drugs available, Suction available and Patient being monitored Patient Re-evaluated:Patient Re-evaluated prior to induction Oxygen Delivery Method: Circle system utilized Preoxygenation: Pre-oxygenation with 100% oxygen Induction Type: IV induction Ventilation: Mask ventilation without difficulty Laryngoscope Size: Mac and 3 Grade View: Grade I Tube type: Oral Number of attempts: 1 Airway Equipment and Method: Stylet Placement Confirmation: ETT inserted through vocal cords under direct vision,  positive ETCO2 and breath sounds checked- equal and bilateral Secured at: 22 cm Tube secured with: Tape Dental Injury: Teeth and Oropharynx as per pre-operative assessment

## 2018-02-17 NOTE — Op Note (Signed)
02/17/2018  10:03 AM  PATIENT:  Whitney Shelton  52 y.o. female  PRE-OPERATIVE DIAGNOSIS:  Menorrhagia, Failed Ablation  POST-OPERATIVE DIAGNOSIS:  Menorrhagia Failed Endometrial Ablation Enterocele Left Adnexal adhesions  PROCEDURE:  Procedure(s): XI ROBOTIC ASSISTED TOTAL HYSTERECTOMY   BILATERAL SALPINGECTOMY LYSIS OF LEFT ADNEXAL ADHESIONS MCCALL CUL DE PLASTY  SURGEON:  Marget Outten  ASSISTANTS: PAUL, CNM   ANESTHESIA:   local and general  ESTIMATED BLOOD LOSS: 100 mL   DRAINS: Urinary Catheter (Foley)   LOCAL MEDICATIONS USED:  MARCAINE    and Amount: 20 ml  SPECIMEN:  Source of Specimen:  UTERUS, CERVIX AND BILATERAL TUBES  DISPOSITION OF SPECIMEN:  PATHOLOGY  COUNTS:  YES  DICTATION #: [id number]  PLAN OF CARE: DC HOME  PATIENT DISPOSITION:  PACU - hemodynamically stable.

## 2018-02-17 NOTE — Transfer of Care (Signed)
Immediate Anesthesia Transfer of Care Note  Patient: Whitney Shelton  Procedure(s) Performed: XI ROBOTIC ASSISTED TOTAL HYSTERECTOMY WITH SALPINGECTOMY (Bilateral Abdomen) PUBO-VAGINAL SLING (N/A Vagina ) CYSTOSCOPY (N/A Bladder)  Patient Location: PACU  Anesthesia Type:General  Level of Consciousness: drowsy  Airway & Oxygen Therapy: Patient Spontanous Breathing and Patient connected to face mask oxygen  Post-op Assessment: Report given to RN and Post -op Vital signs reviewed and stable  Post vital signs: Reviewed and stable  Last Vitals: 97/66, 79, 18, 96% Vitals:   02/17/18 0608  BP: (!) 157/95  Pulse: 72  Temp: 36.5 C  SpO2: 100%    Last Pain:  Vitals:   02/17/18 0608  TempSrc: Oral         Complications: No apparent anesthesia complications

## 2018-02-18 ENCOUNTER — Encounter (HOSPITAL_COMMUNITY): Payer: Self-pay | Admitting: Obstetrics and Gynecology

## 2018-02-18 DIAGNOSIS — N921 Excessive and frequent menstruation with irregular cycle: Secondary | ICD-10-CM | POA: Diagnosis not present

## 2018-02-18 LAB — BASIC METABOLIC PANEL
Anion gap: 10 (ref 5–15)
BUN: 14 mg/dL (ref 6–20)
CHLORIDE: 106 mmol/L (ref 101–111)
CO2: 23 mmol/L (ref 22–32)
CREATININE: 0.94 mg/dL (ref 0.44–1.00)
Calcium: 8.8 mg/dL — ABNORMAL LOW (ref 8.9–10.3)
GFR calc Af Amer: >60 mL/min (ref >60)
GLUCOSE: 179 mg/dL — AB (ref 65–99)
Potassium: 4.4 mmol/L (ref 3.5–5.1)
SODIUM: 139 mmol/L (ref 135–145)

## 2018-02-18 LAB — CBC
HEMATOCRIT: 34.2 % — AB (ref 36.0–46.0)
Hemoglobin: 10.7 g/dL — ABNORMAL LOW (ref 12.0–15.0)
MCH: 28.4 pg (ref 26.0–34.0)
MCHC: 31.3 g/dL (ref 30.0–36.0)
MCV: 90.7 fL (ref 78.0–100.0)
Platelets: 295 10*3/uL (ref 150–400)
RBC: 3.77 MIL/uL — ABNORMAL LOW (ref 3.87–5.11)
RDW: 13.6 % (ref 11.5–15.5)
WBC: 13 10*3/uL — ABNORMAL HIGH (ref 4.0–10.5)

## 2018-02-18 LAB — GLUCOSE, CAPILLARY: GLUCOSE-CAPILLARY: 168 mg/dL — AB (ref 65–99)

## 2018-02-18 MED ORDER — HYDROMORPHONE HCL 2 MG PO TABS: 2.0000 mg | ORAL_TABLET | ORAL | 0 refills | Status: DC | PRN

## 2018-02-18 MED ORDER — TRAMADOL HCL 50 MG PO TABS: 50.0000 mg | ORAL_TABLET | Freq: Four times a day (QID) | ORAL | 0 refills | Status: DC | PRN

## 2018-02-18 MED ORDER — HYDROMORPHONE HCL 2 MG PO TABS
ORAL_TABLET | ORAL | Status: AC
  Filled 2018-02-18: qty 1

## 2018-02-18 MED ORDER — INSULIN ASPART 100 UNIT/ML ~~LOC~~ SOLN
SUBCUTANEOUS | Status: AC
  Filled 2018-02-18: qty 1

## 2018-02-18 MED ORDER — TRAMADOL HCL 50 MG PO TABS
ORAL_TABLET | ORAL | Status: AC
Start: 2018-02-18 — End: 2018-02-18
  Filled 2018-02-18: qty 1

## 2018-02-18 NOTE — Progress Notes (Signed)
POD 1 No complaints Minimal bleeding NO CP or SOB  BP 128/69 (BP Location: Left Arm)   Pulse 71   Temp 97.9 F (36.6 C)   Resp 16   Ht 5\' 6"  (1.676 m)   Wt 104.2 kg (229 lb 12.8 oz)   LMP 12/29/2017 (Exact Date)   SpO2 96%   BMI 37.09 kg/m   CBC    Component Value Date/Time   WBC 13.0 (H) 02/18/2018 0456   RBC 3.77 (L) 02/18/2018 0456   HGB 10.7 (L) 02/18/2018 0456   HCT 34.2 (L) 02/18/2018 0456   PLT 295 02/18/2018 0456   MCV 90.7 02/18/2018 0456   MCH 28.4 02/18/2018 0456   MCHC 31.3 02/18/2018 0456   RDW 13.6 02/18/2018 0456   LYMPHSABS 2.3 09/21/2012 1020   MONOABS 1.1 (H) 09/21/2012 1020   EOSABS 0.2 09/21/2012 1020   BASOSABS 0.0 09/21/2012 1020   HEENT nl Neck supple with FROM CV RRR Lungs CTA No CVAT EXT neg c/c/e Minimal vaginal bleeding  Doing well POD 1 DC home

## 2018-02-18 NOTE — Op Note (Signed)
NAME:  Whitney Shelton, Whitney Shelton NO.:  [id number]  MEDICAL RECORD NO.:  [id number]  LOCATION:                                 FACILITY:  PHYSICIAN:  Lovenia Lovina, [name]     DATE OF BIRTH:  DATE OF PROCEDURE: DATE OF DISCHARGE:                              OPERATIVE REPORT   PREOPERATIVE DIAGNOSIS:  Menometrorrhagia with failed endometrial ablation.  POSTOPERATIVE DIAGNOSES:  Menometrorrhagia with failed endometrial ablation, enterocele and left adnexal adhesions of the left sigmoid mesentery to left adnexa.  PROCEDURE:  Da Vinci-assisted robotic total laparoscopic hysterectomy, bilateral salpingectomy, lysis of left adnexal adhesions, McCall culdoplasty.  SURGEON:  Lovenia Aldean, [name]  ASSISTANTEddie Dibbles.  ANESTHESIA:  Local and general.  DRAINS:  Foley.  COUNTS:  Correct.  The patient to recovery in good condition.  SPECIMEN:  Uterus, cervix and bilateral tubes to Pathology.  BRIEF OPERATIVE NOTE:  After being apprised of the risks of anesthesia, infection, bleeding, injury to surrounding organs, possible need for repair, delayed versus immediate complications to include bowel and bladder injury, possible need for repair, the patient was brought to the operating room, where she was administered a general anesthetic without complications.  Prepped and draped in usual sterile fashion.  Foley catheter placed.  RUMI retractor placed vaginally without difficulty. Single suture placed on the anterior leap of the cervix.  At this time, the infraumbilical incision made with a scalpel.  Veress needle placed, opening pressure of 2-, 3 L of CO2 insufflated without difficulty. Atraumatic trocar placement.  Visualization revealed some subserosal uterine fibroids, bilateral normal ovaries, bilateral normal tubes. Some 11 adnexal adhesions to the sigmoid mesentery along the left.  The two trocars placed; one on the left, one on the right, and assistance port on  the left.  AirSeal initiated.  The vessel sealer and dissecting forceps were used through the left port after the robot was docked in the standard fashion.  The Endo Shears was placed through the right port.  Lysis of adhesions was performed sharply.  Ureter was identified on the left and right.  The mesosalpinx, left tube was dissected sharply and divided down to the level of the cornua.  The retroperitoneal space was entered on the left.  The ureter was noted, dissected along the medial leaf of the peritoneum.  The ovarian ligament on the left was cauterized and ligated, the round ligament was cauterized and ligated, the bladder flap developed sharply.  The uterine vessel was skeletonized on the left.  They were coagulated using bipolar cautery and cut using the vessel sealer.  On the right side, the mesosalpinx undermined and divided using unipolar cautery.  Good hemostasis noted.  Normal ovary noted.  Retroperitoneal space entered on the right and the ovarian ligament dissected sharply and cauterized and divided on the right. Round ligament divided.  Bladder flap developed sharply along the right lateral edge.  Uterine vessel was skeletonized on the right, cauterized, divided using the vessel sealer, bipolar cautery.  The RUMI cup was seen bulging of the cervicovaginal junction.  The bladder has been dissected sharply off anteriorly.  The cup was circumferentially scored using  unipolar cautery and the specimen was detached, divided and removed vaginally.  The cuff was then closed using a 0 V-Loc suture in a continuous running fashion, second imbricating layer placed.  The McCall culdoplasty suture was placed as well.  Good hemostasis was noted. Ureters appeared to be peristalsing normally and bilaterally.  Urine output was adequate and clear.  At this time, good hemostasis was accomplished, irrigation was accomplished, CO2 was released and then re- initiated revealing good hemostasis.   A dilute ropivacaine solution was placed in the pelvis.  The robotic instruments were removed.  The robot was undocked in a standard fashion.  All trocars were removed under direct visualization.  CO2 released.  After CO2 released, positive pressure applied.  Incision was closed using 4-0 Vicryl and Dermabond. Vaginal exam revealed the cuff to be well approximated.  No evidence of dehiscence and well supported.  The patient tolerated the procedure well.  Dr. Matilde Sprang will then proceed to complete the pubovaginal sling.     Lovenia Robbie, [name]     RJT/MEDQ  D:  02/17/2018  T:  02/18/2018  Job:  [id number]  cc:   Lovenia Cornell, [name] Fax: [phone]

## 2018-02-18 NOTE — Progress Notes (Signed)
Wasted 23 ml hydromorphone IV from unused PCA. Wasted in sink. Witnessed by Trude Mcburney, RN.  Blair Hailey, RN

## 2019-11-05 ENCOUNTER — Emergency Department (HOSPITAL_COMMUNITY): Payer: 59

## 2019-11-05 ENCOUNTER — Other Ambulatory Visit: Payer: Self-pay

## 2019-11-05 ENCOUNTER — Encounter (HOSPITAL_COMMUNITY): Payer: Self-pay | Admitting: Emergency Medicine

## 2019-11-05 ENCOUNTER — Inpatient Hospital Stay (HOSPITAL_COMMUNITY)
Admission: EM | Admit: 2019-11-05 | Discharge: 2019-11-12 | DRG: 177 | Disposition: A | Discharge status: Home / Self Care | Payer: 59 | Attending: Internal Medicine | Admitting: Internal Medicine

## 2019-11-05 DIAGNOSIS — E1165 Type 2 diabetes mellitus with hyperglycemia: Secondary | ICD-10-CM | POA: Diagnosis present

## 2019-11-05 DIAGNOSIS — R0602 Shortness of breath: Secondary | ICD-10-CM | POA: Diagnosis present

## 2019-11-05 DIAGNOSIS — J1289 Other viral pneumonia: Secondary | ICD-10-CM | POA: Diagnosis present

## 2019-11-05 DIAGNOSIS — Z79899 Other long term (current) drug therapy: Secondary | ICD-10-CM | POA: Diagnosis not present

## 2019-11-05 DIAGNOSIS — E119 Type 2 diabetes mellitus without complications: Secondary | ICD-10-CM | POA: Diagnosis not present

## 2019-11-05 DIAGNOSIS — M533 Sacrococcygeal disorders, not elsewhere classified: Secondary | ICD-10-CM | POA: Diagnosis not present

## 2019-11-05 DIAGNOSIS — E669 Obesity, unspecified: Secondary | ICD-10-CM | POA: Diagnosis present

## 2019-11-05 DIAGNOSIS — Z90710 Acquired absence of both cervix and uterus: Secondary | ICD-10-CM | POA: Diagnosis not present

## 2019-11-05 DIAGNOSIS — E1169 Type 2 diabetes mellitus with other specified complication: Secondary | ICD-10-CM | POA: Diagnosis not present

## 2019-11-05 DIAGNOSIS — Z7984 Long term (current) use of oral hypoglycemic drugs: Secondary | ICD-10-CM | POA: Diagnosis not present

## 2019-11-05 DIAGNOSIS — J1282 Pneumonia due to coronavirus disease 2019: Secondary | ICD-10-CM | POA: Diagnosis present

## 2019-11-05 DIAGNOSIS — J9601 Acute respiratory failure with hypoxia: Secondary | ICD-10-CM | POA: Diagnosis present

## 2019-11-05 DIAGNOSIS — J4520 Mild intermittent asthma, uncomplicated: Secondary | ICD-10-CM | POA: Diagnosis present

## 2019-11-05 DIAGNOSIS — Z885 Allergy status to narcotic agent status: Secondary | ICD-10-CM | POA: Diagnosis not present

## 2019-11-05 DIAGNOSIS — E785 Hyperlipidemia, unspecified: Secondary | ICD-10-CM | POA: Diagnosis not present

## 2019-11-05 DIAGNOSIS — Z87442 Personal history of urinary calculi: Secondary | ICD-10-CM | POA: Diagnosis not present

## 2019-11-05 DIAGNOSIS — Z9079 Acquired absence of other genital organ(s): Secondary | ICD-10-CM | POA: Diagnosis not present

## 2019-11-05 DIAGNOSIS — U071 COVID-19: Secondary | ICD-10-CM | POA: Diagnosis present

## 2019-11-05 DIAGNOSIS — E871 Hypo-osmolality and hyponatremia: Secondary | ICD-10-CM | POA: Diagnosis present

## 2019-11-05 DIAGNOSIS — Z6837 Body mass index (BMI) 37.0-37.9, adult: Secondary | ICD-10-CM | POA: Diagnosis not present

## 2019-11-05 DIAGNOSIS — J9691 Respiratory failure, unspecified with hypoxia: Secondary | ICD-10-CM

## 2019-11-05 LAB — COMPREHENSIVE METABOLIC PANEL
ALT: 27 U/L (ref 0–44)
AST: 26 U/L (ref 15–41)
Albumin: 3.6 g/dL (ref 3.5–5.0)
Alkaline Phosphatase: 65 U/L (ref 38–126)
Anion gap: 12 (ref 5–15)
BUN: 18 mg/dL (ref 6–20)
CO2: 22 mmol/L (ref 22–32)
Calcium: 9 mg/dL (ref 8.9–10.3)
Chloride: 99 mmol/L (ref 98–111)
Creatinine, Ser: 1.01 mg/dL — ABNORMAL HIGH (ref 0.44–1.00)
GFR calc Af Amer: >60 mL/min (ref >60)
GFR calc non Af Amer: >60 mL/min (ref >60)
Glucose, Bld: 306 mg/dL — ABNORMAL HIGH (ref 70–99)
Potassium: 4.1 mmol/L (ref 3.5–5.1)
Sodium: 133 mmol/L — ABNORMAL LOW (ref 135–145)
Total Bilirubin: 0.6 mg/dL (ref 0.3–1.2)
Total Protein: 7.8 g/dL (ref 6.5–8.1)

## 2019-11-05 LAB — CBC WITH DIFFERENTIAL/PLATELET
Abs Immature Granulocytes: 0.13 10*3/uL — ABNORMAL HIGH (ref 0.00–0.07)
Basophils Absolute: 0 10*3/uL (ref 0.0–0.1)
Basophils Relative: 0 %
Eosinophils Absolute: 0 10*3/uL (ref 0.0–0.5)
Eosinophils Relative: 0 %
HCT: 40 % (ref 36.0–46.0)
Hemoglobin: 12.9 g/dL (ref 12.0–15.0)
Immature Granulocytes: 1 %
Lymphocytes Relative: 11 %
Lymphs Abs: 1.2 10*3/uL (ref 0.7–4.0)
MCH: 29.1 pg (ref 26.0–34.0)
MCHC: 32.3 g/dL (ref 30.0–36.0)
MCV: 90.3 fL (ref 80.0–100.0)
Monocytes Absolute: 0.4 10*3/uL (ref 0.1–1.0)
Monocytes Relative: 4 %
Neutro Abs: 9.7 10*3/uL — ABNORMAL HIGH (ref 1.7–7.7)
Neutrophils Relative %: 84 %
Platelets: 198 10*3/uL (ref 150–400)
RBC: 4.43 MIL/uL (ref 3.87–5.11)
RDW: 13.5 % (ref 11.5–15.5)
WBC: 11.6 10*3/uL — ABNORMAL HIGH (ref 4.0–10.5)
nRBC: 0 % (ref 0.0–0.2)

## 2019-11-05 LAB — C-REACTIVE PROTEIN
CRP: 17.8 mg/dL — ABNORMAL HIGH (ref <1.0)
CRP: 15.8 mg/dL — ABNORMAL HIGH (ref <1.0)

## 2019-11-05 LAB — CK TOTAL AND CKMB (NOT AT ARMC)
CK, MB: 2.8 ng/mL (ref 0.5–5.0)
Relative Index: 1.6 (ref 0.0–2.5)
Total CK: 170 U/L (ref 38–234)

## 2019-11-05 LAB — LACTATE DEHYDROGENASE: LDH: 261 U/L — ABNORMAL HIGH (ref 98–192)

## 2019-11-05 LAB — LACTIC ACID, PLASMA
Lactic Acid, Venous: 2 mmol/L (ref 0.5–1.9)
Lactic Acid, Venous: 1.8 mmol/L (ref 0.5–1.9)

## 2019-11-05 LAB — BRAIN NATRIURETIC PEPTIDE: B Natriuretic Peptide: 20.3 pg/mL (ref 0.0–100.0)

## 2019-11-05 LAB — PROCALCITONIN
Procalcitonin: 0.11 ng/mL
Procalcitonin: <0.1 ng/mL

## 2019-11-05 LAB — SARS CORONAVIRUS 2 BY RT PCR (HOSPITAL ORDER, PERFORMED IN ~~LOC~~ HOSPITAL LAB): SARS Coronavirus 2: POSITIVE — AB

## 2019-11-05 LAB — FIBRINOGEN: Fibrinogen: 719 mg/dL — ABNORMAL HIGH (ref 210–475)

## 2019-11-05 LAB — D-DIMER, QUANTITATIVE: D-Dimer, Quant: 0.43 ug/mL-FEU (ref 0.00–0.50)

## 2019-11-05 LAB — TRIGLYCERIDES: Triglycerides: 359 mg/dL — ABNORMAL HIGH (ref <150)

## 2019-11-05 LAB — FERRITIN: Ferritin: 217 ng/mL (ref 11–307)

## 2019-11-05 MED ORDER — ONDANSETRON HCL 4 MG PO TABS: 4.0000 mg | ORAL_TABLET | Freq: Four times a day (QID) | ORAL | Status: DC | PRN

## 2019-11-05 MED ORDER — VITAMIN C 500 MG PO TABS
500.0000 mg | ORAL_TABLET | Freq: Every day | ORAL | Status: DC
  Administered 2019-11-06 – 2019-11-12 (×7): 500 mg via ORAL
  Filled 2019-11-05 (×8): qty 1

## 2019-11-05 MED ORDER — ZINC SULFATE 220 (50 ZN) MG PO CAPS
220.0000 mg | ORAL_CAPSULE | Freq: Every day | ORAL | Status: DC
  Administered 2019-11-06 – 2019-11-12 (×7): 220 mg via ORAL
  Filled 2019-11-05 (×8): qty 1

## 2019-11-05 MED ORDER — SODIUM CHLORIDE 0.9 % IV SOLN
200.0000 mg | Freq: Once | INTRAVENOUS | Status: AC
  Administered 2019-11-05: 200 mg via INTRAVENOUS
  Filled 2019-11-05: qty 40

## 2019-11-05 MED ORDER — ONDANSETRON HCL 4 MG/2ML IJ SOLN: 4.0000 mg | Freq: Four times a day (QID) | INTRAMUSCULAR | Status: DC | PRN

## 2019-11-05 MED ORDER — SODIUM CHLORIDE 0.9 % IV SOLN: 100.0000 mg | INTRAVENOUS | Status: DC

## 2019-11-05 MED ORDER — ENOXAPARIN SODIUM 60 MG/0.6ML ~~LOC~~ SOLN
50.0000 mg | SUBCUTANEOUS | Status: DC
  Administered 2019-11-06 – 2019-11-11 (×6): 50 mg via SUBCUTANEOUS
  Filled 2019-11-05 (×7): qty 0.6

## 2019-11-05 MED ORDER — GUAIFENESIN-DM 100-10 MG/5ML PO SYRP
5.0000 mL | ORAL_SOLUTION | ORAL | Status: DC | PRN
  Administered 2019-11-05 – 2019-11-12 (×13): 5 mL via ORAL
  Filled 2019-11-05 (×8): qty 5
  Filled 2019-11-05: qty 10
  Filled 2019-11-05 (×5): qty 5

## 2019-11-05 MED ORDER — INSULIN ASPART 100 UNIT/ML ~~LOC~~ SOLN: 0.0000 [IU] | Freq: Three times a day (TID) | SUBCUTANEOUS | Status: DC

## 2019-11-05 MED ORDER — ALBUTEROL SULFATE HFA 108 (90 BASE) MCG/ACT IN AERS
2.0000 | INHALATION_SPRAY | RESPIRATORY_TRACT | Status: DC | PRN
  Filled 2019-11-05: qty 6.7

## 2019-11-05 MED ORDER — INSULIN ASPART 100 UNIT/ML ~~LOC~~ SOLN: 0.0000 [IU] | Freq: Every day | SUBCUTANEOUS | Status: DC

## 2019-11-05 MED ORDER — ATORVASTATIN CALCIUM 10 MG PO TABS
10.0000 mg | ORAL_TABLET | Freq: Every day | ORAL | Status: DC
  Administered 2019-11-05 – 2019-11-11 (×7): 10 mg via ORAL
  Filled 2019-11-05 (×7): qty 1

## 2019-11-05 MED ORDER — ENOXAPARIN SODIUM 40 MG/0.4ML ~~LOC~~ SOLN: 40.0000 mg | SUBCUTANEOUS | Status: DC

## 2019-11-05 MED ORDER — ALLOPURINOL 100 MG PO TABS
100.0000 mg | ORAL_TABLET | Freq: Every day | ORAL | Status: DC
  Administered 2019-11-05 – 2019-11-11 (×7): 100 mg via ORAL
  Filled 2019-11-05 (×7): qty 1

## 2019-11-05 MED ORDER — TRAZODONE HCL 50 MG PO TABS
25.0000 mg | ORAL_TABLET | Freq: Every evening | ORAL | Status: DC | PRN
  Administered 2019-11-05 – 2019-11-09 (×5): 25 mg via ORAL
  Filled 2019-11-05 (×5): qty 1

## 2019-11-05 MED ORDER — HYDROCOD POLST-CPM POLST ER 10-8 MG/5ML PO SUER
5.0000 mL | Freq: Two times a day (BID) | ORAL | Status: DC | PRN
  Administered 2019-11-09: 5 mL via ORAL
  Filled 2019-11-05 (×2): qty 5

## 2019-11-05 MED ORDER — DEXAMETHASONE SODIUM PHOSPHATE 10 MG/ML IJ SOLN
6.0000 mg | Freq: Every day | INTRAMUSCULAR | Status: DC
  Administered 2019-11-05 – 2019-11-06 (×2): 6 mg via INTRAVENOUS
  Filled 2019-11-05 (×2): qty 1

## 2019-11-05 MED ORDER — SODIUM CHLORIDE 0.9 % IV SOLN
100.0000 mg | Freq: Once | INTRAVENOUS | Status: AC
  Administered 2019-11-06: 100 mg via INTRAVENOUS
  Filled 2019-11-05: qty 100

## 2019-11-05 MED ORDER — ACETAMINOPHEN 325 MG PO TABS
650.0000 mg | ORAL_TABLET | Freq: Four times a day (QID) | ORAL | Status: DC | PRN
  Administered 2019-11-05 – 2019-11-12 (×11): 650 mg via ORAL
  Filled 2019-11-05 (×12): qty 2

## 2019-11-05 MED ORDER — SODIUM CHLORIDE 0.9 % IV SOLN
100.0000 mg | INTRAVENOUS | Status: AC
  Administered 2019-11-07 – 2019-11-09 (×3): 100 mg via INTRAVENOUS
  Filled 2019-11-05 (×3): qty 100

## 2019-11-05 MED ORDER — TRAMADOL HCL 50 MG PO TABS
50.0000 mg | ORAL_TABLET | Freq: Two times a day (BID) | ORAL | Status: DC | PRN
  Administered 2019-11-07 (×2): 50 mg via ORAL
  Filled 2019-11-05 (×3): qty 1

## 2019-11-05 MED ORDER — MOMETASONE FURO-FORMOTEROL FUM 200-5 MCG/ACT IN AERO
2.0000 | INHALATION_SPRAY | Freq: Two times a day (BID) | RESPIRATORY_TRACT | Status: DC
  Administered 2019-11-06 – 2019-11-12 (×14): 2 via RESPIRATORY_TRACT
  Filled 2019-11-05: qty 8.8

## 2019-11-05 NOTE — ED Notes (Signed)
Date and time results received: 11/05/19 4:14 PM  (use smartphrase ".now" to insert current time)  Test: Lactic acid Critical Value: 2.0  Name of Provider Notified: Dr. Francia Greaves  Orders Received? Or Actions Taken?:

## 2019-11-05 NOTE — ED Notes (Signed)
Attempted to call report to [facility] nurse, unable to take report at this time stated they would call back later.

## 2019-11-05 NOTE — ED Notes (Signed)
[facility] notified for transport to [facility].

## 2019-11-05 NOTE — ED Provider Notes (Signed)
[place] DEPT Provider Note   CSN: [id number] Arrival date & time: 11/05/19  1357     History   Chief Complaint Chief Complaint  Patient presents with   Shortness of Breath    HPI Whitney Shelton is a 53 y.o. female.     53 year old female with prior medical history as detailed below presents with suspected COVID pneumonia. Patient reports onset of symptoms about 8 days prior. She reports COVID positive test with [facility] on Wednesday of last week. She complains of fever, cough, and shortness of breath. She reports worsening symptoms over the last 48 hours.  R  She has completed a z-pack. She is in the process of completing a prednisone taper.   The history is provided by the patient and medical records.  Shortness of Breath Severity:  Moderate Onset quality:  Gradual Duration:  3 days Timing:  Constant Progression:  Worsening Chronicity:  New Relieved by:  Nothing Worsened by:  Exertion   Past Medical History:  Diagnosis Date   Complication of anesthesia HARD TO WAKE   slower to arouse    Diabetes mellitus without complication ([place])    type 2   Family history of adverse reaction to anesthesia    mother slow to wake up   Fibroids    Frequency of urination    Migraine    Renal calculus or stone LEFT SIDE M X1  NON-OBSTRUCTIVE   SUI (stress urinary incontinence, female)    Unspecified asthma(493.90) HX BRONCHIAL ASTHMA  --- LAST BOUT OF BRONCHITIS LAST WINTER  2012   usually with colds or heavy exercise    Patient Active Problem List   Diagnosis Date Noted   Menorrhagia 02/17/2018   Nephrolithiasis, 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    Past Surgical History:  Procedure Laterality Date   CYSTOSCOPY N/A 02/17/2018   Procedure: CYSTOSCOPY;  Surgeon: Bjorn Loser, MD;  Location: [facility];  Service: Urology;  Laterality: N/A;   CYSTOSCOPY W/ URETERAL STENT PLACEMENT  09/22/2012   Procedure: CYSTOSCOPY WITH  RETROGRADE PYELOGRAM/URETERAL STENT PLACEMENT;  Surgeon: Reece Packer, MD;  Location: [facility];  Service: Urology;  Laterality: Left;   CYSTOSCOPY W/ URETERAL STENT REMOVAL  10/05/2012   Procedure: CYSTOSCOPY WITH STENT REMOVAL;  Surgeon: Alexis Frock, MD;  Location: [facility];  Service: Urology;;   [place], URETEROSCOPY AND STENT PLACEMENT  10/26/2012   Procedure: CYSTOSCOPY WITH RETROGRADE PYELOGRAM, URETEROSCOPY AND STENT PLACEMENT;  Surgeon: Alexis Frock, MD;  Location: [facility];  Service: Urology;  Laterality: Left;   CYSTOSCOPY WITH STENT PLACEMENT Left 07/28/2017   Procedure: CYSTOSCOPY RETROGRADE WITH LEFT STENT PLACEMENT;  Surgeon: Franchot Gallo, MD;  Location: [facility];  Service: Urology;  Laterality: Left;   CYSTOSCOPY/URETEROSCOPY/HOLMIUM LASER/STENT PLACEMENT Left 08/11/2017   Procedure: CYSTOSCOPY/RETROGRADE/URETEROSCOPY/HOLMIUM LASER/STENT EXCHANGE;  Surgeon: Alexis Frock, MD;  Location: [facility];  Service: Urology;  Laterality: Left;   ENDOMETRIAL ABLATION W/ NOVASURE  04-25-2004   EYE SURGERY     lasix eye surgery   HYSTEROSCOPY WITH RESECTOSCOPE  01-04-2004   RESECTION FIBROID   PUBOVAGINAL SLING N/A 02/17/2018   Procedure: Gaynelle Arabian;  Surgeon: Bjorn Loser, MD;  Location: [facility];  Service: Urology;  Laterality: N/A;   ROBOTIC ASSISTED TOTAL HYSTERECTOMY Bilateral 02/17/2018   Procedure: XI ROBOTIC ASSISTED TOTAL HYSTERECTOMY WITH SALPINGECTOMY;  Surgeon: Brien Few, MD;  Location: [facility];  Service: Gynecology;  Laterality: Bilateral;   URETEROSCOPY  10/05/2012  Procedure: URETEROSCOPY;  Surgeon: Alexis Frock, MD;  Location: [facility];  Service: Urology;  Laterality: Left;   WISDOM TOOTH EXTRACTION  AGE 89   GEN. ANES.     OB History   No obstetric history on file.      Home Medications    Prior to Admission medications   Medication Sig Start Date  End Date Taking? Authorizing Provider  albuterol (PROVENTIL HFA;VENTOLIN HFA) 108 (90 Base) MCG/ACT inhaler Inhale 1 puff into the lungs every 4 (four) hours as needed for wheezing or shortness of breath.   Yes [provider]  allopurinol (ZYLOPRIM) 100 MG tablet Take 100 mg by mouth at bedtime.  01/23/18  Yes [provider]  atorvastatin (LIPITOR) 10 MG tablet Take 1 tablet by mouth at bedtime.  01/23/18  Yes [provider]  magnesium gluconate (MAGONATE) 500 MG tablet Take 500 mg by mouth daily.   Yes [provider]  metFORMIN (GLUCOPHAGE-XR) 500 MG 24 hr tablet Take 500 mg by mouth daily. 01/21/18  Yes [provider]  omega-3 acid ethyl esters (LOVAZA) 1 g capsule Take 1 g by mouth 4 (four) times daily.   Yes [provider]  HYDROmorphone (DILAUDID) 2 MG tablet Take 1 tablet (2 mg total) by mouth every 4 (four) hours as needed for severe pain. Patient not taking: Reported on 11/05/2019 02/18/18   Brien Few, MD  traMADol (ULTRAM) 50 MG tablet Take 1-2 tablets (50-100 mg total) by mouth every 6 (six) hours as needed for moderate pain. Patient not taking: Reported on 11/05/2019 02/18/18   Brien Few, MD    Family History History reviewed. No pertinent family history.  Social History Social History   Tobacco Use   Smoking status: Never Smoker   Smokeless tobacco: Never Used  Substance Use Topics   Alcohol use: Yes    Comment: rarely    Drug use: No     Allergies   Vicodin [hydrocodone-acetaminophen] and Percocet [oxycodone-acetaminophen]   Review of Systems Review of Systems  Respiratory: Positive for shortness of breath.   All other systems reviewed and are negative.    Physical Exam Updated Vital Signs BP 136/80 (BP Location: Right Arm)    Pulse (!) 101    Temp 98.1 F (36.7 C) (Oral)    Resp (!) 22    Ht 5\' 6"  (1.676 m)    Wt 104.3 kg    LMP  (LMP Unknown)    SpO2 100%    BMI 37.12 kg/m   Physical  Exam Vitals signs and nursing note reviewed.  Constitutional:      General: She is not in acute distress.    Appearance: She is well-developed.  HENT:     Head: Normocephalic and atraumatic.  Eyes:     Conjunctiva/sclera: Conjunctivae normal.     Pupils: Pupils are equal, round, and reactive to light.  Neck:     Musculoskeletal: Normal range of motion and neck supple.  Cardiovascular:     Rate and Rhythm: Normal rate and regular rhythm.     Heart sounds: Normal heart sounds.  Pulmonary:     Effort: Pulmonary effort is normal. No respiratory distress.     Breath sounds: Normal breath sounds.  Abdominal:     General: There is no distension.     Palpations: Abdomen is soft.     Tenderness: There is no abdominal tenderness.  Musculoskeletal: Normal range of motion.        General: No  deformity.  Skin:    General: Skin is warm and dry.  Neurological:     Mental Status: She is alert and oriented to person, place, and time.      ED Treatments / Results  Labs (all labs ordered are listed, but only abnormal results are displayed) Labs Reviewed  LACTIC ACID, PLASMA - Abnormal; Notable for the following components:      Result Value   Lactic Acid, Venous 2.0 (*)    All other components within normal limits  CBC WITH DIFFERENTIAL/PLATELET - Abnormal; Notable for the following components:   WBC 11.6 (*)    Neutro Abs 9.7 (*)    Abs Immature Granulocytes 0.13 (*)    All other components within normal limits  COMPREHENSIVE METABOLIC PANEL - Abnormal; Notable for the following components:   Sodium 133 (*)    Glucose, Bld 306 (*)    Creatinine, Ser 1.01 (*)    All other components within normal limits  LACTATE DEHYDROGENASE - Abnormal; Notable for the following components:   LDH 261 (*)    All other components within normal limits  TRIGLYCERIDES - Abnormal; Notable for the following components:   Triglycerides 359 (*)    All other components within normal limits  FIBRINOGEN -  Abnormal; Notable for the following components:   Fibrinogen 719 (*)    All other components within normal limits  C-REACTIVE PROTEIN - Abnormal; Notable for the following components:   CRP 17.8 (*)    All other components within normal limits  SARS CORONAVIRUS 2 (TAT 6-24 HRS)  CULTURE, BLOOD (ROUTINE X 2)  CULTURE, BLOOD (ROUTINE X 2)  D-DIMER, QUANTITATIVE (NOT AT [facility])  FERRITIN  LACTIC ACID, PLASMA  PROCALCITONIN    EKG EKG Interpretation  Date/Time:  Sunday November 05 2019 14:17:15 EST Ventricular Rate:  96 PR Interval:    QRS Duration: 89 QT Interval:  348 QTC Calculation: 440 R Axis:   35 Text Interpretation: Sinus rhythm Probable left atrial enlargement Borderline T wave abnormalities since last tracing no significant change Confirmed by Daleen Bo [phone]) on 11/05/2019 2:26:44 PM   Radiology Dg Chest Port 1 View  Result Date: 11/05/2019 CLINICAL DATA:  Dyspnea EXAM: PORTABLE CHEST 1 VIEW COMPARISON:  None. FINDINGS: Cardiomegaly. Bilateral, predominantly peripheral heterogeneous airspace opacity. The visualized skeletal structures are unremarkable. IMPRESSION: 1. Bilateral, predominantly peripheral heterogeneous airspace opacity, consistent with multifocal infection. 2.  Cardiomegaly. Electronically Signed   By: Eddie Candle [name]   On: 11/05/2019 15:40    Procedures Procedures (including critical care time) CRITICAL CARE Performed by: Valarie Merino   Total critical care time: 30 minutes  Critical care time was exclusive of separately billable procedures and treating other patients.  Critical care was necessary to treat or prevent imminent or life-threatening deterioration.  Critical care was time spent personally by me on the following activities: development of treatment plan with patient and/or surrogate as well as nursing, discussions with consultants, evaluation of patient's response to treatment, examination of patient, obtaining history from  patient or surrogate, ordering and performing treatments and interventions, ordering and review of laboratory studies, ordering and review of radiographic studies, pulse oximetry and re-evaluation of patient's condition.   Medications Ordered in ED Medications - No data to display   Initial Impression / Assessment and Plan / ED Course  I have reviewed the triage vital signs and the nursing notes.  Pertinent labs & imaging results that were available during my care of the patient were reviewed  by me and considered in my medical decision making (see chart for details).        MDM  Screen complete  Whitney Shelton was evaluated in Emergency Department on 11/05/2019 for the symptoms described in the history of present illness. She was evaluated in the context of the global COVID-19 pandemic, which necessitated consideration that the patient might be at risk for infection with the SARS-CoV-2 virus that causes COVID-19. Institutional protocols and algorithms that pertain to the evaluation of patients at risk for COVID-19 are in a state of rapid change based on information released by regulatory bodies including the CDC and federal and state organizations. These policies and algorithms were followed during the patient's care in the ED.  Patient is presenting with shortness of breath suggestive of worsening Covid pneumonia.  Patient was recently diagnosed with Covid as an outpatient last week.  She is currently on day 8 of her symptoms.  Patient will require admission for further work-up and treatment.  Hospitalist service is aware of case and will evaluate for admission.  Final Clinical Impressions(s) / ED Diagnoses   Final diagnoses:  Pneumonia due to COVID-19 virus    ED Discharge Orders    None       Valarie Merino, MD 11/05/19 1644

## 2019-11-05 NOTE — H&P (Signed)
History and Physical    Whitney Shelton [id number] DOB: [birth year] DOA: 11/05/2019  PCP: Glendon Axe, MD  Patient coming from: home  Chief Complaint: shortness of breath, cough and fever  HPI: Whitney Shelton is a 53 y.o. female with history of asthma, DM-2 and nephrolithiasis presenting with shortness of breath, cough and fever.  Patient had shortness of breath and cough for about a week.  Cough is productive with whitish phlegm.  She denies hemoptysis.  Patient and her daughter had fever to 101 on Tuesday.  Shey presented to PCP at [facility] in [facility] on Wednesday where they were swabbed for COVID-19 and started on Z-Pak and prednisone that she completed today.  Their COVID-19 test came back positive.    Patient continued to have progressive shortness of breath, productive cough with whitish phlegm and also spiked fever to 103 this morning that prompted her family to call EMS.  Per EMS, desaturated to lower 80s on room air.  She was a started on nonrebreather and brought to ED. Per triage nurse note, she desaturated to 84% on room air requiring 6 L by HFNC to maintain saturation in low 90s.  However, she felt somehow better later on.    She denies chest pain, hemoptysis, nausea, vomiting, and ischemia or loss of taste.  She reports loose bowel movements.  She denies melena or hematochezia, focal weakness, numbness, tingling or UTI symptoms.  Patient lives with her husband.  Denies smoking cigarettes, drinking alcohol recreational drug use.  In ED, slightly tachypneic.  Saturation in low 90s on 6 L by HFNC.  Normotensive.  11.6 with left shift but no lymphopenia.  Sodium 133.  CMP glucose 306.  Creatinine 1.01.  LDH 261.  BNP 20.3.  Ferritin 217.  CRP 17.8.  Procalcitonin 0.11.  D-dimer 0.43.  Fibrinogen 719.  Lactic acid 2.0.  Chest x-ray revealed cardiomegaly and bilateral peripheral heterogeneous airspace opacity consistent with multifocal infection. Covid swab ordered hospital  service was called for admission for acute respiratory failure with hypoxia due to COVID-19 pneumonia.  ROS All review of system negative except for pertinent positives and negatives as history of present illness above.  PMH Past Medical History:  Diagnosis Date  . Complication of anesthesia HARD TO WAKE   slower to arouse   . Diabetes mellitus without complication ([place])    type 2  . Family history of adverse reaction to anesthesia    mother slow to wake up  . Fibroids   . Frequency of urination   . Migraine   . Renal calculus or stone LEFT SIDE M X1  NON-OBSTRUCTIVE  . SUI (stress urinary incontinence, female)   . Unspecified asthma(493.90) HX BRONCHIAL ASTHMA  --- LAST BOUT OF BRONCHITIS LAST WINTER  2012   usually with colds or heavy exercise   PSH Past Surgical History:  Procedure Laterality Date  . CYSTOSCOPY N/A 02/17/2018   Procedure: CYSTOSCOPY;  Surgeon: Bjorn Loser, MD;  Location: [facility];  Service: Urology;  Laterality: N/A;  . CYSTOSCOPY W/ URETERAL STENT PLACEMENT  09/22/2012   Procedure: CYSTOSCOPY WITH RETROGRADE PYELOGRAM/URETERAL STENT PLACEMENT;  Surgeon: Reece Packer, MD;  Location: [facility];  Service: Urology;  Laterality: Left;  . CYSTOSCOPY W/ URETERAL STENT REMOVAL  10/05/2012   Procedure: CYSTOSCOPY WITH STENT REMOVAL;  Surgeon: Alexis Frock, MD;  Location[facility];  Service: Urology;;  . CYSTOSCOPY WITH RETROGRADE PYELOGRAM, URETEROSCOPY AND STENT PLACEMENT  10/26/2012   Procedure: CYSTOSCOPY WITH RETROGRADE  PYELOGRAM, URETEROSCOPY AND STENT PLACEMENT;  Surgeon: Alexis Frock, MD;  Location: [facility];  Service: Urology;  Laterality: Left;  . CYSTOSCOPY WITH STENT PLACEMENT Left 07/28/2017   Procedure: CYSTOSCOPY RETROGRADE WITH LEFT STENT PLACEMENT;  Surgeon: Franchot Gallo, MD;  Location: [facility];  Service: Urology;  Laterality: Left;  . CYSTOSCOPY/URETEROSCOPY/HOLMIUM LASER/STENT PLACEMENT Left 08/11/2017    Procedure: CYSTOSCOPY/RETROGRADE/URETEROSCOPY/HOLMIUM LASER/STENT EXCHANGE;  Surgeon: Alexis Frock, MD;  Location: [facility];  Service: Urology;  Laterality: Left;  . ENDOMETRIAL ABLATION W/ NOVASURE  04-25-2004  . EYE SURGERY     [facility] eye surgery  . HYSTEROSCOPY WITH RESECTOSCOPE  01-04-2004   RESECTION FIBROID  . PUBOVAGINAL SLING N/A 02/17/2018   Procedure: Gaynelle Arabian;  Surgeon: Bjorn Loser, MD;  Location: [facility];  Service: Urology;  Laterality: N/A;  . ROBOTIC ASSISTED TOTAL HYSTERECTOMY Bilateral 02/17/2018   Procedure: XI ROBOTIC ASSISTED TOTAL HYSTERECTOMY WITH SALPINGECTOMY;  Surgeon: Brien Few, MD;  Location: [facility];  Service: Gynecology;  Laterality: Bilateral;  . URETEROSCOPY  10/05/2012   Procedure: URETEROSCOPY;  Surgeon: Alexis Frock, MD;  Location: [facility];  Service: Urology;  Laterality: Left;  . WISDOM TOOTH EXTRACTION  AGE 89   GEN. ANES.   Fam HX Denies family history of heart disease.  Social Hx Denies smoking cigarettes, drinking alcohol recreational drug use.  Allergy Allergies  Allergen Reactions  . Vicodin [Hydrocodone-Acetaminophen] Nausea Only  . Percocet [Oxycodone-Acetaminophen] Nausea Only   Home Meds Prior to Admission medications   Medication Sig Start Date End Date Taking? Authorizing Provider  albuterol (PROVENTIL HFA;VENTOLIN HFA) 108 (90 Base) MCG/ACT inhaler Inhale 1 puff into the lungs every 4 (four) hours as needed for wheezing or shortness of breath.   Yes [provider]  allopurinol (ZYLOPRIM) 100 MG tablet Take 100 mg by mouth at bedtime.  01/23/18  Yes [provider]  atorvastatin (LIPITOR) 10 MG tablet Take 1 tablet by mouth at bedtime.  01/23/18  Yes [provider]  magnesium gluconate (MAGONATE) 500 MG tablet Take 500 mg by mouth daily.   Yes [provider]  metFORMIN (GLUCOPHAGE-XR) 500 MG 24 hr tablet Take 500 mg by mouth daily. 01/21/18  Yes [provider]  omega-3 acid ethyl esters (LOVAZA) 1 g capsule Take 1 g by mouth 4 (four) times daily.   Yes [provider]    Physical Exam: Vitals:   11/05/19 1600 11/05/19 1630 11/05/19 1700 11/05/19 1730  BP: (!) 156/87 139/88 (!) 156/88 (!) 144/81  Pulse: (!) 104 98 95 (!) 101  Resp: 17 18 (!) 27 (!) 24  Temp:      TempSrc:      SpO2: 93% 93% 92% 92%  Weight:      Height:        GENERAL: No acute distress.  Appears well.  HEENT: MMM.  Vision and hearing grossly intact.  NECK: Supple.  No apparent JVD.  RESP:  No IWOB.  Fair air movement bilaterally. CVS:  RRR. Heart sounds normal.  ABD/GI/GU: Bowel sounds present. Soft. Non tender.  MSK/EXT:  Moves extremities. No apparent deformity or edema.  SKIN: no apparent skin lesion or wound NEURO: Awake, alert and oriented appropriately.  No gross deficit.  PSYCH: Calm. Normal affect.   Personally Reviewed Radiological Exams Dg Chest Port 1 View  Result Date: 11/05/2019 CLINICAL DATA:  Dyspnea EXAM: PORTABLE CHEST 1 VIEW COMPARISON:  None. FINDINGS: Cardiomegaly. Bilateral, predominantly peripheral heterogeneous airspace opacity. The visualized skeletal structures are  unremarkable. IMPRESSION: 1. Bilateral, predominantly peripheral heterogeneous airspace opacity, consistent with multifocal infection. 2.  Cardiomegaly. Electronically Signed   By: Eddie Candle [name]   On: 11/05/2019 15:40     Personally Reviewed Labs: CBC: Recent Labs  Lab 11/05/19 1524  WBC 11.6*  NEUTROABS 9.7*  HGB 12.9  HCT 40.0  MCV 90.3  PLT 99991111   Basic Metabolic Panel: Recent Labs  Lab 11/05/19 1524  NA 133*  K 4.1  CL 99  CO2 22  GLUCOSE 306*  BUN 18  CREATININE 1.01*  CALCIUM 9.0   GFR: Estimated Creatinine Clearance: 78.6 mL/min (A) (by C-G formula based on SCr of 1.01 mg/dL (H)). Liver Function Tests: Recent Labs  Lab 11/05/19 1524  AST 26  ALT 27  ALKPHOS 65  BILITOT 0.6  PROT 7.8  ALBUMIN 3.6   No results for input(s):  LIPASE, AMYLASE in the last 168 hours. No results for input(s): AMMONIA in the last 168 hours. Coagulation Profile: No results for input(s): INR, PROTIME in the last 168 hours. Cardiac Enzymes: No results for input(s): CKTOTAL, CKMB, CKMBINDEX, TROPONINI in the last 168 hours. BNP (last 3 results) No results for input(s): PROBNP in the last 8760 hours. HbA1C: No results for input(s): HGBA1C in the last 72 hours. CBG: No results for input(s): GLUCAP in the last 168 hours. Lipid Profile: Recent Labs    11/05/19 1528  TRIG 359*   Thyroid Function Tests: No results for input(s): TSH, T4TOTAL, FREET4, T3FREE, THYROIDAB in the last 72 hours. Anemia Panel: Recent Labs    11/05/19 1527  FERRITIN 217   Urine analysis:    Component Value Date/Time   COLORURINE STRAW (A) 07/28/2017 0500   APPEARANCEUR CLEAR 07/28/2017 0500   LABSPEC 1.013 07/28/2017 0500   PHURINE 5.0 07/28/2017 0500   GLUCOSEU 50 (A) 07/28/2017 0500   HGBUR SMALL (A) 07/28/2017 0500   BILIRUBINUR NEGATIVE 07/28/2017 0500   KETONESUR 5 (A) 07/28/2017 0500   PROTEINUR 30 (A) 07/28/2017 0500   UROBILINOGEN 0.2 09/21/2012 1135   NITRITE NEGATIVE 07/28/2017 0500   LEUKOCYTESUR NEGATIVE 07/28/2017 0500    Sepsis Labs:  Lactic acid 2.0. Procalcitonin 0.11.  Personally Reviewed EKG:  NSR without acute ischemic finding or abnormal interval.  Assessment/Plan Active Problems:   Acute hypoxemic respiratory failure due to COVID-19 [facility])  Acute hypoxemic respiratory failure due to COVID-19 infection and patient with mild intermittent asthma: requiring 6 L by HFNC to maintain saturation in low 90s.  Tested positive for COVID-19 at PCP office 4 days ago.  Repeat Covid swab here pending.  Elevated LDH, CRP and positive chest x-ray.  Procalcitonin and D-dimer negative. -Start dexamethasone 6 mg daily -Remdesivir per [place] and as needed albuterol inhaler -Daily inflammatory markers. -Follow  COVID-19 swab result here  Uncontrolled DM2 with hyperglycemia: on Metformin at home. -Check hemoglobin A1c -CBG monitoring and sliding scale insulin -Continue home statin  History of nephrolithiasis -Continue home allopurinol  Mild hyponatremia -Continue monitoring DVT prophylaxis: Subcu Lovenox  Code Status: Full code-addressed on admission. Family Communication: Updated patient's husband over the phone  Disposition Plan: Admit to progressive unit at [facility] called: None Admission status: Inpatient   Mercy Riding MD [facility]  If 7PM-7AM, please contact night-coverage [email or web address] Password [facility]  11/05/2019, 5:53 PM

## 2019-11-05 NOTE — ED Notes (Signed)
Please call when transferred to [facility] (husband) Kitt Course [phone]

## 2019-11-05 NOTE — ED Triage Notes (Signed)
Patient arrived by EMS from home. Pt c/o SOB. Patient had low SPO2 reading at home in the 80's.   Patient was diagnosed w/ COVID-19 on Wednesday. Patient was tested by [facility].   History of DM, Asthma.   Patient is alert and oriented x 4.   Patient was placed on non-rebreather. Patient was 84% on RA SPO2 while in ED. Attempt was made for [place] , patient needed more oxygen.

## 2019-11-06 DIAGNOSIS — J1289 Other viral pneumonia: Secondary | ICD-10-CM

## 2019-11-06 LAB — COMPREHENSIVE METABOLIC PANEL
ALT: 24 U/L (ref 0–44)
AST: 23 U/L (ref 15–41)
Albumin: 3.4 g/dL — ABNORMAL LOW (ref 3.5–5.0)
Alkaline Phosphatase: 62 U/L (ref 38–126)
Anion gap: 15 (ref 5–15)
BUN: 20 mg/dL (ref 6–20)
CO2: 21 mmol/L — ABNORMAL LOW (ref 22–32)
Calcium: 8.9 mg/dL (ref 8.9–10.3)
Chloride: 99 mmol/L (ref 98–111)
Creatinine, Ser: 0.91 mg/dL (ref 0.44–1.00)
GFR calc Af Amer: >60 mL/min (ref >60)
GFR calc non Af Amer: >60 mL/min (ref >60)
Glucose, Bld: 345 mg/dL — ABNORMAL HIGH (ref 70–99)
Potassium: 4.3 mmol/L (ref 3.5–5.1)
Sodium: 135 mmol/L (ref 135–145)
Total Bilirubin: 0.2 mg/dL — ABNORMAL LOW (ref 0.3–1.2)
Total Protein: 7.5 g/dL (ref 6.5–8.1)

## 2019-11-06 LAB — GLUCOSE, CAPILLARY
Glucose-Capillary: 241 mg/dL — ABNORMAL HIGH (ref 70–99)
Glucose-Capillary: 366 mg/dL — ABNORMAL HIGH (ref 70–99)
Glucose-Capillary: 388 mg/dL — ABNORMAL HIGH (ref 70–99)
Glucose-Capillary: 402 mg/dL — ABNORMAL HIGH (ref 70–99)
Glucose-Capillary: 478 mg/dL — ABNORMAL HIGH (ref 70–99)
Glucose-Capillary: 484 mg/dL — ABNORMAL HIGH (ref 70–99)
Glucose-Capillary: 494 mg/dL — ABNORMAL HIGH (ref 70–99)
Glucose-Capillary: 505 mg/dL (ref 70–99)

## 2019-11-06 LAB — CBC WITH DIFFERENTIAL/PLATELET
Abs Immature Granulocytes: 0.09 10*3/uL — ABNORMAL HIGH (ref 0.00–0.07)
Basophils Absolute: 0 10*3/uL (ref 0.0–0.1)
Basophils Relative: 0 %
Eosinophils Absolute: 0 10*3/uL (ref 0.0–0.5)
Eosinophils Relative: 0 %
HCT: 38.7 % (ref 36.0–46.0)
Hemoglobin: 12.6 g/dL (ref 12.0–15.0)
Immature Granulocytes: 1 %
Lymphocytes Relative: 10 %
Lymphs Abs: 1 10*3/uL (ref 0.7–4.0)
MCH: 29.1 pg (ref 26.0–34.0)
MCHC: 32.6 g/dL (ref 30.0–36.0)
MCV: 89.4 fL (ref 80.0–100.0)
Monocytes Absolute: 0.2 10*3/uL (ref 0.1–1.0)
Monocytes Relative: 2 %
Neutro Abs: 8.2 10*3/uL — ABNORMAL HIGH (ref 1.7–7.7)
Neutrophils Relative %: 87 %
Platelets: 227 10*3/uL (ref 150–400)
RBC: 4.33 MIL/uL (ref 3.87–5.11)
RDW: 13.6 % (ref 11.5–15.5)
WBC: 9.5 10*3/uL (ref 4.0–10.5)
nRBC: 0 % (ref 0.0–0.2)

## 2019-11-06 LAB — TYPE AND SCREEN
ABO/RH(D): AB POS
Antibody Screen: NEGATIVE

## 2019-11-06 LAB — ABO/RH: ABO/RH(D): AB POS

## 2019-11-06 LAB — D-DIMER, QUANTITATIVE: D-Dimer, Quant: 0.47 ug/mL-FEU (ref 0.00–0.50)

## 2019-11-06 LAB — FERRITIN: Ferritin: 251 ng/mL (ref 11–307)

## 2019-11-06 LAB — C-REACTIVE PROTEIN: CRP: 21.8 mg/dL — ABNORMAL HIGH (ref <1.0)

## 2019-11-06 LAB — PROCALCITONIN: Procalcitonin: 0.16 ng/mL

## 2019-11-06 LAB — HEMOGLOBIN A1C
Hgb A1c MFr Bld: 9.2 % — ABNORMAL HIGH (ref 4.8–5.6)
Mean Plasma Glucose: 217.34 mg/dL

## 2019-11-06 MED ORDER — INSULIN GLARGINE 100 UNIT/ML ~~LOC~~ SOLN
10.0000 [IU] | Freq: Every day | SUBCUTANEOUS | Status: DC
  Administered 2019-11-06: 10 [IU] via SUBCUTANEOUS
  Filled 2019-11-06: qty 0.1

## 2019-11-06 MED ORDER — INSULIN ASPART 100 UNIT/ML ~~LOC~~ SOLN
5.0000 [IU] | Freq: Once | SUBCUTANEOUS | Status: AC
  Administered 2019-11-06: 5 [IU] via SUBCUTANEOUS

## 2019-11-06 MED ORDER — INSULIN ASPART 100 UNIT/ML ~~LOC~~ SOLN
10.0000 [IU] | Freq: Once | SUBCUTANEOUS | Status: AC
  Administered 2019-11-06: 10 [IU] via SUBCUTANEOUS

## 2019-11-06 MED ORDER — METHYLPREDNISOLONE SODIUM SUCC 40 MG IJ SOLR
40.0000 mg | Freq: Two times a day (BID) | INTRAMUSCULAR | Status: DC
  Administered 2019-11-06 – 2019-11-10 (×10): 40 mg via INTRAVENOUS
  Filled 2019-11-06 (×11): qty 1

## 2019-11-06 MED ORDER — INSULIN GLARGINE 100 UNIT/ML ~~LOC~~ SOLN
35.0000 [IU] | Freq: Every day | SUBCUTANEOUS | Status: DC
  Filled 2019-11-06: qty 0.35

## 2019-11-06 MED ORDER — INSULIN ASPART 100 UNIT/ML ~~LOC~~ SOLN
30.0000 [IU] | Freq: Once | SUBCUTANEOUS | Status: AC
  Administered 2019-11-06: 30 [IU] via SUBCUTANEOUS

## 2019-11-06 MED ORDER — INSULIN ASPART 100 UNIT/ML ~~LOC~~ SOLN: 0.0000 [IU] | Freq: Every day | SUBCUTANEOUS | Status: DC

## 2019-11-06 MED ORDER — INSULIN ASPART 100 UNIT/ML ~~LOC~~ SOLN: 0.0000 [IU] | Freq: Three times a day (TID) | SUBCUTANEOUS | Status: DC

## 2019-11-06 MED ORDER — INSULIN ASPART 100 UNIT/ML ~~LOC~~ SOLN
4.0000 [IU] | Freq: Three times a day (TID) | SUBCUTANEOUS | Status: DC
  Administered 2019-11-06 (×2): 4 [IU] via SUBCUTANEOUS

## 2019-11-06 MED ORDER — INSULIN GLARGINE 100 UNIT/ML ~~LOC~~ SOLN
40.0000 [IU] | Freq: Every day | SUBCUTANEOUS | Status: DC
  Administered 2019-11-06: 40 [IU] via SUBCUTANEOUS
  Filled 2019-11-06 (×2): qty 0.4

## 2019-11-06 MED ORDER — INSULIN ASPART 100 UNIT/ML ~~LOC~~ SOLN
0.0000 [IU] | Freq: Every day | SUBCUTANEOUS | Status: DC
  Administered 2019-11-07 – 2019-11-08 (×2): 3 [IU] via SUBCUTANEOUS
  Administered 2019-11-09 – 2019-11-10 (×2): 2 [IU] via SUBCUTANEOUS

## 2019-11-06 MED ORDER — INSULIN ASPART 100 UNIT/ML ~~LOC~~ SOLN
0.0000 [IU] | Freq: Three times a day (TID) | SUBCUTANEOUS | Status: DC
  Administered 2019-11-06 – 2019-11-07 (×2): 7 [IU] via SUBCUTANEOUS
  Administered 2019-11-07 (×2): 11 [IU] via SUBCUTANEOUS
  Administered 2019-11-08 (×3): 7 [IU] via SUBCUTANEOUS
  Administered 2019-11-09: 4 [IU] via SUBCUTANEOUS
  Administered 2019-11-09 (×2): 11 [IU] via SUBCUTANEOUS
  Administered 2019-11-10: 09:00:00 4 [IU] via SUBCUTANEOUS
  Administered 2019-11-10: 11 [IU] via SUBCUTANEOUS
  Administered 2019-11-10: 20 [IU] via SUBCUTANEOUS
  Administered 2019-11-11: 7 [IU] via SUBCUTANEOUS
  Administered 2019-11-11: 4 [IU] via SUBCUTANEOUS
  Administered 2019-11-11: 11 [IU] via SUBCUTANEOUS

## 2019-11-06 MED ORDER — TOCILIZUMAB 400 MG/20ML IV SOLN
800.0000 mg | Freq: Once | INTRAVENOUS | Status: AC
  Administered 2019-11-06: 800 mg via INTRAVENOUS
  Filled 2019-11-06: qty 40

## 2019-11-06 MED ORDER — INSULIN ASPART 100 UNIT/ML ~~LOC~~ SOLN
15.0000 [IU] | Freq: Once | SUBCUTANEOUS | Status: AC
  Administered 2019-11-06: 15 [IU] via SUBCUTANEOUS

## 2019-11-06 MED ORDER — INSULIN GLARGINE 100 UNIT/ML ~~LOC~~ SOLN: 409.0000 [IU] | Freq: Every day | SUBCUTANEOUS | Status: DC

## 2019-11-06 NOTE — Plan of Care (Signed)
?  Problem: Education: ?Goal: Knowledge of General Education information will improve ?Description: Including pain rating scale, medication(s)/side effects and non-pharmacologic comfort measures ?Outcome: Progressing ?  ?Problem: Health Behavior/Discharge Planning: ?Goal: Ability to manage health-related needs will improve ?Outcome: Progressing ?  ?Problem: Coping: ?Goal: Level of anxiety will decrease ?Outcome: Progressing ?  ?

## 2019-11-06 NOTE — Progress Notes (Signed)
Inpatient Diabetes Program Recommendations  AACE/ADA: New Consensus Statement on Inpatient Glycemic Control (2015)  Target Ranges:  Prepandial:   less than 140 mg/dL      Peak postprandial:   less than 180 mg/dL (1-2 hours)      Critically ill patients:  140 - 180 mg/dL   Results for TRESIA, FRONEK (MRN [id number]) as of 11/06/2019 11:13  Ref. Range 11/05/2019 23:49 11/06/2019 01:04 11/06/2019 01:37  Glucose-Capillary Latest Ref Range: 70 - 99 mg/dL 494 (H)  15 units NOVOLOG  484 (H) 388 (H)  5 units NOVOLOG +  10 units LANTUS    Admit with: Acute hypoxemic respiratory failure due to COVID-19 infection   History: Diabetes  Home DM Meds: Metformin 500 mg Daily  Current Orders: Novolog Resistant Correction Scale/ SSI (0-20 units) TID AC + HS      Novolog 4 units TID with meals      Lantus 35 units Daily      Got Decadron 6 mg yesterday at 5pm and another 6 mg Decadron at 11am.  Now Getting Solumedrol 40 mg BID.  Note patient received 10 units Lantus at 2am and will get 40 units Lantus today.  Novolog SSI and Novolog Meal Coverage both started at 12pm today.  Awaiting Current A1c results.     --Will follow patient during hospitalization--  Wyn Quaker RN, MSN, CDE Diabetes Coordinator Inpatient Glycemic Control Team Team Pager: [phone] (8a-5p)

## 2019-11-06 NOTE — Progress Notes (Signed)
Paged Dr.SIngh about pt BG of 478 per md give 30 of Novalog and recheck BG at 2200

## 2019-11-06 NOTE — Progress Notes (Signed)
On call provider notified of CBG 402 at 2200 recheck. Pt reports drinking "Naked" fruit drinks today without knowing that the drink was high in carbohydrates. Pt educated on avoiding this drink at this time- she expressed understanding. Awaiting further orders.

## 2019-11-06 NOTE — Progress Notes (Addendum)
Repeat CBG 1h after novolog 388: 1) giving another 5u novolog now 2) putting patient on lantus 10u QHS (first dose now) 3) upping AC/HS scale to moderate

## 2019-11-06 NOTE — Progress Notes (Signed)
PROGRESS NOTE                                                                                                                                                                                                             Patient Demographics:    Whitney Shelton, is a 53 y.o. female, DOB - 1966/05/21, YQI:[id number]  Outpatient Primary MD for the patient is Glendon Axe, MD    LOS - 1  Admit date - 11/05/2019    Chief Complaint  Patient presents with  . Shortness of Breath       Brief Narrative   Whitney Shelton is a 53 y.o. female with history of asthma, DM-2 and nephrolithiasis presenting with shortness of breath, cough and fever, she had symptoms for about 1 week prior to hospital admission, she was diagnosed with COVID-19 infection at [facility] clinic few days ago.  When she came to the ER she was diagnosed with acute COVID-19 pneumonitis causing acute hypoxic respiratory failure.  She was placed on 6 L high flow nasal cannula oxygen and admitted.  I saw her the following day on 12 L high flow nasal cannula oxygen with progressive disease and rising CRP.   Subjective:    Pincus Large today has, No headache, No chest pain, No abdominal pain - No Nausea, No new weakness tingling or numbness, + Cough & SOB.     Assessment  & Plan :    Active Problems:   Acute hypoxemic respiratory failure due to COVID-19 ([place])   1. Acute Hypoxic Resp. Failure due to Acute Covid 19 Viral Pneumonitis during the ongoing 2020 Covid 19 Pandemic - she has severe disease, has been started on IV steroids and remdesivir but still quite hypoxic requiring 12 L high flow oxygen on 11/06/2019.  We will go ahead and use Actemra.  Encouraged her to sit up in chair use flutter valve and I asked for pulmonary toiletry and prone in bed at night.  She is also consented for both Actemra use and convalescent plasma if needed.  Actemra off label use - patient was told  that if COVID-19 pneumonitis gets worse we might potentially use Actemra off label, she denies any known history of tuberculosis or hepatitis, understands the risks and benefits and wants to proceed with Actemra treatment if required.  SpO2: 90 %  O2 Flow Rate (L/min): 12 L/min  COVID-19 Labs  Recent Labs    11/05/19 1524 11/05/19 1527 11/05/19 1715 11/06/19 0255  DDIMER 0.43  --   --   --   FERRITIN  --  217  --  251  LDH 261*  --   --   --   CRP  --  17.8* 15.8* 21.8*    Lab Results  Component Value Date   SARSCOV2NAA POSITIVE (A) 11/05/2019    Hepatic Function Latest Ref Rng & Units 11/06/2019 11/05/2019  Total Protein 6.5 - 8.1 g/dL 7.5 7.8  Albumin 3.5 - 5.0 g/dL 3.4(L) 3.6  AST 15 - 41 U/L 23 26  ALT 0 - 44 U/L 24 27  Alk Phosphatase 38 - 126 U/L 62 65  Total Bilirubin 0.3 - 1.2 mg/dL 0.2(L) 0.6        Component Value Date/Time   BNP 20.3 11/05/2019 1530     2.  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  3. Obesity.  BMI of 37.  Follow with PCP.  4.  DM type II.  Check A1c, placed on Lantus, high-dose sliding scale and premeal NovoLog.  Will monitor and adjust.  No results found for: HGBA1C CBG (last 3)  Recent Labs    11/05/19 2349 11/06/19 0104 11/06/19 0137  GLUCAP 494* 484* 388*       Condition - Extremely Guarded  Family Communication  : Message left for patient's husband on his listed cell phone number on 11/06/2019 at 11:10 AM  Code Status : Full  Diet :   Diet Order            Diet Carb Modified Fluid consistency: Thin; Room service appropriate? Yes  Diet effective now               Disposition Plan  :  Inpt  Consults  :  None  Procedures  :    PUD Prophylaxis :    DVT Prophylaxis  :  Lovenox    Lab Results  Component Value Date   PLT 227 11/06/2019    Inpatient Medications  Scheduled Meds: . allopurinol  100 mg Oral QHS  . atorvastatin  10 mg Oral QHS  . enoxaparin (LOVENOX)  injection  50 mg Subcutaneous Q24H  . insulin aspart  0-20 Units Subcutaneous TID WC  . insulin aspart  0-5 Units Subcutaneous QHS  . insulin aspart  4 Units Subcutaneous TID WC  . insulin glargine  409 Units Subcutaneous Daily  . methylPREDNISolone (SOLU-MEDROL) injection  40 mg Intravenous Q12H  . mometasone-formoterol  2 puff Inhalation BID  . vitamin C  500 mg Oral Daily  . zinc sulfate  220 mg Oral Daily   Continuous Infusions: . remdesivir 100 mg in NS 250 mL    . [START ON 11/07/2019] remdesivir 100 mg in NS 250 mL     PRN Meds:.acetaminophen, albuterol, chlorpheniramine-HYDROcodone, guaiFENesin-dextromethorphan, [DISCONTINUED] ondansetron **OR** ondansetron (ZOFRAN) IV, traMADol, traZODone  Antibiotics  :    Anti-infectives (From admission, onward)   Start     Dose/Rate Route Frequency Ordered Stop   11/07/19 1000  remdesivir 100 mg in sodium chloride 0.9 % 250 mL IVPB     100 mg 500 mL/hr over 30 Minutes Intravenous Every 24 hours 11/05/19 2340 11/10/19 0959   11/06/19 2200  remdesivir 100 mg in sodium chloride 0.9 % 250 mL IVPB  Status:  Discontinued     100 mg 500 mL/hr  over 30 Minutes Intravenous Every 24 hours 11/05/19 1808 11/05/19 2340   11/06/19 1600  remdesivir 100 mg in sodium chloride 0.9 % 250 mL IVPB     100 mg 500 mL/hr over 30 Minutes Intravenous  Once 11/05/19 2340     11/05/19 2000  remdesivir 200 mg in sodium chloride 0.9 % 250 mL IVPB     200 mg 500 mL/hr over 30 Minutes Intravenous Once 11/05/19 1808 11/05/19 2330       Time Spent in minutes  30   Lala Lund M.D on 11/06/2019 at 11:15 AM  To page go to [email or web address] - password [facility]  [facility] -  Office  [phone]  See all Orders from today for further details    Objective:   Vitals:   11/06/19 0307 11/06/19 0642 11/06/19 0645 11/06/19 0759  BP:   127/74 123/78  Pulse: (!) 109  94   Resp:   (!) 29 16  Temp: 98.8 F (37.1 C) 99.9 F (37.7 C)  99.4 F (37.4 C)   TempSrc: Oral Oral  Oral  SpO2:   95% 90%  Weight:      Height:        Wt Readings from Last 3 Encounters:  11/05/19 104.3 kg  02/17/18 104.2 kg  02/14/18 104.2 kg     Intake/Output Summary (Last 24 hours) at 11/06/2019 1115 Last data filed at 11/06/2019 0307 Gross per 24 hour  Intake 250 ml  Output 300 ml  Net -50 ml     Physical Exam  Awake Alert, Oriented X 3, No new F.N deficits, Normal affect [place].AT,PERRAL Supple Neck,No JVD, No cervical lymphadenopathy appriciated.  Symmetrical Chest wall movement, Good air movement bilaterally, CTAB RRR,No Gallops,Rubs or new Murmurs, No Parasternal Heave +ve B.Sounds, Abd Soft, No tenderness, No organomegaly appriciated, No rebound - guarding or rigidity. No Cyanosis, Clubbing or edema, No new Rash or bruise     Data Review:    CBC Recent Labs  Lab 11/05/19 1524 11/06/19 0255  WBC 11.6* 9.5  HGB 12.9 12.6  HCT 40.0 38.7  PLT 198 227  MCV 90.3 89.4  MCH 29.1 29.1  MCHC 32.3 32.6  RDW 13.5 13.6  LYMPHSABS 1.2 1.0  MONOABS 0.4 0.2  EOSABS 0.0 0.0  BASOSABS 0.0 0.0    Chemistries  Recent Labs  Lab 11/05/19 1524 11/06/19 0255  NA 133* 135  K 4.1 4.3  CL 99 99  CO2 22 21*  GLUCOSE 306* 345*  BUN 18 20  CREATININE 1.01* 0.91  CALCIUM 9.0 8.9  AST 26 23  ALT 27 24  ALKPHOS 65 62  BILITOT 0.6 0.2*   ------------------------------------------------------------------------------------------------------------------ Recent Labs    11/05/19 1528  TRIG 359*    No results found for: HGBA1C ------------------------------------------------------------------------------------------------------------------ No results for input(s): TSH, T4TOTAL, T3FREE, THYROIDAB in the last 72 hours.  Invalid input(s): FREET3  Cardiac Enzymes Recent Labs  Lab 11/05/19 1530  CKMB 2.8   ------------------------------------------------------------------------------------------------------------------    Component Value  Date/Time   BNP 20.3 11/05/2019 1530    Micro Results Recent Results (from the past 240 hour(s))  Blood Culture (routine x 2)     Status: None (Preliminary result)   Collection Time: 11/05/19  3:01 PM   Specimen: BLOOD  Result Value Ref Range Status   Specimen Description   Final    BLOOD LEFT ANTECUBITAL Performed at [facility], [place] [address], [place], [place] [address]    Special Requests   Final  BOTTLES DRAWN AEROBIC AND ANAEROBIC Blood Culture adequate volume Performed at [facility] [address] [place], [place] [address]    Culture   Final    NO GROWTH < 24 HOURS Performed at [facility] [address] [place], [place] [address]    Report Status PENDING  Incomplete  SARS Coronavirus 2 by RT PCR (hospital order, performed in [facility] hospital lab) Nasopharyngeal Nasopharyngeal Swab     Status: Abnormal   Collection Time: 11/05/19  3:07 PM   Specimen: Nasopharyngeal Swab  Result Value Ref Range Status   SARS Coronavirus 2 POSITIVE (A) NEGATIVE Final    Comment: RESULT CALLED TO, READ BACK BY AND VERIFIED WITH: RN H COOK AT 2025 11/05/2019 BY L BENFIELD (NOTE) If result is NEGATIVE SARS-CoV-2 target nucleic acids are NOT DETECTED. The SARS-CoV-2 RNA is generally detectable in upper and lower  respiratory specimens during the acute phase of infection. The lowest  concentration of SARS-CoV-2 viral copies this assay can detect is 250  copies / mL. A negative result does not preclude SARS-CoV-2 infection  and should not be used as the sole basis for treatment or other  patient management decisions.  A negative result may occur with  improper specimen collection / handling, submission of specimen other  than nasopharyngeal swab, presence of viral mutation(s) within the  areas targeted by this assay, and inadequate number of viral copies  (<250 copies / mL). A negative result must be combined with clinical   observations, patient history, and epidemiological information. If result is POSITIVE SARS-CoV-2 target nucleic acids are DETEC TED. The SARS-CoV-2 RNA is generally detectable in upper and lower  respiratory specimens during the acute phase of infection.  Positive  results are indicative of active infection with SARS-CoV-2.  Clinical  correlation with patient history and other diagnostic information is  necessary to determine patient infection status.  Positive results do  not rule out bacterial infection or co-infection with other viruses. If result is PRESUMPTIVE POSTIVE SARS-CoV-2 nucleic acids MAY BE PRESENT.   A presumptive positive result was obtained on the submitted specimen  and confirmed on repeat testing.  While 2019 novel coronavirus  (SARS-CoV-2) nucleic acids may be present in the submitted sample  additional confirmatory testing may be necessary for epidemiological  and / or clinical management purposes  to differentiate between  SARS-CoV-2 and other Sarbecovirus currently known to infect humans.  If clinically indicated additional testing with an alternate test  methodology (LAB7 453) is advised. The SARS-CoV-2 RNA is generally  detectable in upper and lower respiratory specimens during the acute  phase of infection. The expected result is Negative. Fact Sheet for Patients:  StrictlyIdeas.no Fact Sheet for Healthcare Providers: BankingDealers.co.za This test is not yet approved or cleared by the [place] FDA and has been authorized for detection and/or diagnosis of SARS-CoV-2 by FDA under an Emergency Use Authorization (EUA).  This EUA will remain in effect (meaning this test can be used) for the duration of the COVID-19 declaration under Section 564(b)(1) of the Act, 21 U.S.C. section 360bbb-3(b)(1), unless the authorization is terminated or revoked sooner. Performed at [facility], [place] [address],  [place], [place] [address]   Blood Culture (routine x 2)     Status: None (Preliminary result)   Collection Time: 11/05/19  3:45 PM   Specimen: BLOOD LEFT FOREARM  Result Value Ref Range Status   Specimen Description   Final    BLOOD LEFT  FOREARM Performed at [facility], [place] [address], [place], [place] [address]    Special Requests   Final    BOTTLES DRAWN AEROBIC ONLY Blood Culture results may not be optimal due to an inadequate volume of blood received in culture bottles Performed at [facility] [address], [place], [place] [address]    Culture   Final    NO GROWTH < 24 HOURS Performed at [facility] [address], [place], [place] [address]    Report Status PENDING  Incomplete    Radiology Reports Dg Chest Port 1 View  Result Date: 11/05/2019 CLINICAL DATA:  Dyspnea EXAM: PORTABLE CHEST 1 VIEW COMPARISON:  None. FINDINGS: Cardiomegaly. Bilateral, predominantly peripheral heterogeneous airspace opacity. The visualized skeletal structures are unremarkable. IMPRESSION: 1. Bilateral, predominantly peripheral heterogeneous airspace opacity, consistent with multifocal infection. 2.  Cardiomegaly. Electronically Signed   By: Eddie Candle [name]   On: 11/05/2019 15:40

## 2019-11-06 NOTE — Significant Event (Signed)
CBG 494, given decadron earlier in day and no insulin yet.  Normally just on metformin at home.  Giving 15u novolog [place] and recheck in 1h.

## 2019-11-07 LAB — CBC WITH DIFFERENTIAL/PLATELET
Abs Immature Granulocytes: 0.16 10*3/uL — ABNORMAL HIGH (ref 0.00–0.07)
Basophils Absolute: 0 10*3/uL (ref 0.0–0.1)
Basophils Relative: 1 %
Eosinophils Absolute: 0 10*3/uL (ref 0.0–0.5)
Eosinophils Relative: 0 %
HCT: 38.9 % (ref 36.0–46.0)
Hemoglobin: 12.4 g/dL (ref 12.0–15.0)
Immature Granulocytes: 2 %
Lymphocytes Relative: 18 %
Lymphs Abs: 1.4 10*3/uL (ref 0.7–4.0)
MCH: 28.9 pg (ref 26.0–34.0)
MCHC: 31.9 g/dL (ref 30.0–36.0)
MCV: 90.7 fL (ref 80.0–100.0)
Monocytes Absolute: 0.3 10*3/uL (ref 0.1–1.0)
Monocytes Relative: 4 %
Neutro Abs: 6.1 10*3/uL (ref 1.7–7.7)
Neutrophils Relative %: 75 %
Platelets: 268 10*3/uL (ref 150–400)
RBC: 4.29 MIL/uL (ref 3.87–5.11)
RDW: 13.8 % (ref 11.5–15.5)
WBC: 8 10*3/uL (ref 4.0–10.5)
nRBC: 0 % (ref 0.0–0.2)

## 2019-11-07 LAB — COMPREHENSIVE METABOLIC PANEL
ALT: 23 U/L (ref 0–44)
AST: 22 U/L (ref 15–41)
Albumin: 3.1 g/dL — ABNORMAL LOW (ref 3.5–5.0)
Alkaline Phosphatase: 59 U/L (ref 38–126)
Anion gap: 13 (ref 5–15)
BUN: 27 mg/dL — ABNORMAL HIGH (ref 6–20)
CO2: 21 mmol/L — ABNORMAL LOW (ref 22–32)
Calcium: 8.9 mg/dL (ref 8.9–10.3)
Chloride: 102 mmol/L (ref 98–111)
Creatinine, Ser: 0.89 mg/dL (ref 0.44–1.00)
GFR calc Af Amer: >60 mL/min (ref >60)
GFR calc non Af Amer: >60 mL/min (ref >60)
Glucose, Bld: 255 mg/dL — ABNORMAL HIGH (ref 70–99)
Potassium: 4.3 mmol/L (ref 3.5–5.1)
Sodium: 136 mmol/L (ref 135–145)
Total Bilirubin: 0.3 mg/dL (ref 0.3–1.2)
Total Protein: 7.2 g/dL (ref 6.5–8.1)

## 2019-11-07 LAB — C-REACTIVE PROTEIN: CRP: 17.5 mg/dL — ABNORMAL HIGH (ref <1.0)

## 2019-11-07 LAB — GLUCOSE, CAPILLARY
Glucose-Capillary: 275 mg/dL — ABNORMAL HIGH (ref 70–99)
Glucose-Capillary: 276 mg/dL — ABNORMAL HIGH (ref 70–99)
Glucose-Capillary: 237 mg/dL — ABNORMAL HIGH (ref 70–99)
Glucose-Capillary: 269 mg/dL — ABNORMAL HIGH (ref 70–99)

## 2019-11-07 LAB — HIV ANTIBODY (ROUTINE TESTING W REFLEX): HIV Screen 4th Generation wRfx: NONREACTIVE — AB

## 2019-11-07 LAB — PROCALCITONIN: Procalcitonin: <0.1 ng/mL

## 2019-11-07 LAB — D-DIMER, QUANTITATIVE: D-Dimer, Quant: 0.42 ug/mL-FEU (ref 0.00–0.50)

## 2019-11-07 LAB — MAGNESIUM: Magnesium: 2.3 mg/dL (ref 1.7–2.4)

## 2019-11-07 LAB — BRAIN NATRIURETIC PEPTIDE: B Natriuretic Peptide: 19.4 pg/mL (ref 0.0–100.0)

## 2019-11-07 MED ORDER — SALINE SPRAY 0.65 % NA SOLN: 1.0000 | NASAL | Status: DC | PRN

## 2019-11-07 MED ORDER — LIP MEDEX EX OINT
TOPICAL_OINTMENT | CUTANEOUS | Status: DC | PRN
  Filled 2019-11-07: qty 7

## 2019-11-07 MED ORDER — FUROSEMIDE 10 MG/ML IJ SOLN
40.0000 mg | Freq: Once | INTRAMUSCULAR | Status: AC
  Administered 2019-11-07: 40 mg via INTRAVENOUS
  Filled 2019-11-07: qty 4

## 2019-11-07 MED ORDER — GUAIFENESIN ER 600 MG PO TB12
600.0000 mg | ORAL_TABLET | Freq: Two times a day (BID) | ORAL | Status: DC
  Administered 2019-11-07 – 2019-11-12 (×10): 600 mg via ORAL
  Filled 2019-11-07 (×11): qty 1

## 2019-11-07 MED ORDER — FUROSEMIDE 10 MG/ML IJ SOLN
20.0000 mg | Freq: Once | INTRAMUSCULAR | Status: AC
  Administered 2019-11-07: 20 mg via INTRAVENOUS
  Filled 2019-11-07: qty 2

## 2019-11-07 MED ORDER — SALINE SPRAY 0.65 % NA SOLN
1.0000 | NASAL | Status: DC | PRN
  Filled 2019-11-07 (×3): qty 44

## 2019-11-07 MED ORDER — INSULIN GLARGINE 100 UNIT/ML ~~LOC~~ SOLN
50.0000 [IU] | Freq: Every day | SUBCUTANEOUS | Status: DC
  Administered 2019-11-07 – 2019-11-08 (×2): 50 [IU] via SUBCUTANEOUS
  Filled 2019-11-07 (×2): qty 0.5

## 2019-11-07 MED ORDER — GUAIFENESIN ER 600 MG PO TB12: 600.0000 mg | ORAL_TABLET | Freq: Two times a day (BID) | ORAL | Status: DC | PRN

## 2019-11-07 MED ORDER — TOCILIZUMAB 400 MG/20ML IV SOLN
800.0000 mg | Freq: Once | INTRAVENOUS | Status: AC
  Administered 2019-11-07: 800 mg via INTRAVENOUS
  Filled 2019-11-07: qty 40

## 2019-11-07 MED ORDER — TOCILIZUMAB 400 MG/20ML IV SOLN: 800.0000 mg | Freq: Once | INTRAVENOUS | Status: DC

## 2019-11-07 MED ORDER — INSULIN ASPART 100 UNIT/ML ~~LOC~~ SOLN
6.0000 [IU] | Freq: Three times a day (TID) | SUBCUTANEOUS | Status: DC
  Administered 2019-11-07 – 2019-11-11 (×15): 6 [IU] via SUBCUTANEOUS

## 2019-11-07 NOTE — Progress Notes (Addendum)
PROGRESS NOTE                                                                                                                                                                                                             Patient Demographics:    Whitney Shelton, is a 53 y.o. female, DOB - 05-Mar-1966, FOY:[id number]  Outpatient Primary MD for the patient is Glendon Axe, MD    LOS - 2  Admit date - 11/05/2019    Chief Complaint  Patient presents with   Shortness of Breath       Brief Narrative   Whitney Shelton is a 53 y.o. female with history of asthma, DM-2 and nephrolithiasis presenting with shortness of breath, cough and fever, she had symptoms for about 1 week prior to hospital admission, she was diagnosed with COVID-19 infection at [facility] clinic few days ago.  When she came to the ER she was diagnosed with acute COVID-19 pneumonitis causing acute hypoxic respiratory failure.  She was placed on 6 L high flow nasal cannula oxygen and admitted.  I saw her the following day on 12 L high flow nasal cannula oxygen with progressive disease and rising CRP.   Subjective:   Patient in bed, appears comfortable, denies any headache, no fever, no chest pain or pressure, improved shortness of breath , no abdominal pain. No focal weakness.   Assessment  & Plan :     1. Acute Hypoxic Resp. Failure due to Acute Covid 19 Viral Pneumonitis during the ongoing 2020 Covid 19 Pandemic - she has severe disease, has been started on IV steroids and remdesivir but still quite hypoxic requiring 12 L -15 L high flow oxygen on 11/06/2019.  She received Actemra first dose on 11/06/2019, CRP still elevated and she is still hypoxic, due to her body weight 800 mg of Actemra might be a low to moderate dose will repeat another dose on 11/07/2019, continue to monitor clinically along with her inflammatory markers.  Encouraged her to sit up in chair use  flutter valve and I asked for pulmonary toiletry and prone in bed at night.  Patient states that she feels a little better in terms of her breathing.  Will monitor closely.   [place]    11/05/19 1524  11/05/19 1527 11/05/19 1715 11/06/19 0255  11/07/19 0245  DDIMER 0.43  --   --   --  0.47 0.42  FERRITIN  --   --  217  --  251  --   LDH 261*  --   --   --   --   --   CRP  --    < > 17.8* 15.8* 21.8* 17.5*   < > = values in this interval not displayed.    Lab Results  Component Value Date   SARSCOV2NAA POSITIVE (A) 11/05/2019    Hepatic Function Latest Ref Rng & Units 11/07/2019 11/06/2019 11/05/2019  Total Protein 6.5 - 8.1 g/dL 7.2 7.5 7.8  Albumin 3.5 - 5.0 g/dL 3.1(L) 3.4(L) 3.6  AST 15 - 41 U/L _0 ALT 0 - 44 U/L _1 Alk Phosphatase 38 - 126 U/L 59 62 65  Total Bilirubin 0.3 - 1.2 mg/dL 0.3 0.2(L) 0.6        Component Value Date/Time   BNP 19.4 11/07/2019 0245     2.  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  3. Obesity.  BMI of 37.  Follow with PCP.  4.  DM type II.  Check A1c, placed on Lantus dose increased, high-dose sliding scale and premeal NovoLog.  Will monitor and adjust.  Lab Results  Component Value Date   HGBA1C 9.2 (H) 11/06/2019   CBG (last 3)  Recent Labs    11/06/19 1948 11/06/19 2219 11/07/19 0747  GLUCAP 505* 402* 275*       Condition - Extremely Guarded  Family Communication  : Message left for patient's husband on his listed cell phone number on 11/06/2019 at 11:10 AM.  11/07/19  Code Status : Full  Diet :   Diet Order            Diet Carb Modified Fluid consistency: Thin; Room service appropriate? Yes  Diet effective now               Disposition Plan  :  Inpt  Consults  :  None  Procedures  :    PUD Prophylaxis :    DVT Prophylaxis  :  Lovenox    Lab Results  Component Value Date   PLT 268 11/07/2019    Inpatient Medications  Scheduled  Meds:  allopurinol  100 mg Oral QHS   atorvastatin  10 mg Oral QHS   enoxaparin (LOVENOX) injection  50 mg Subcutaneous Q24H   furosemide  40 mg Intravenous Once   insulin aspart  0-20 Units Subcutaneous TID WC   insulin aspart  0-5 Units Subcutaneous QHS   insulin aspart  6 Units Subcutaneous TID WC   insulin glargine  50 Units Subcutaneous Daily   methylPREDNISolone (SOLU-MEDROL) injection  40 mg Intravenous Q12H   mometasone-formoterol  2 puff Inhalation BID   vitamin C  500 mg Oral Daily   zinc sulfate  220 mg Oral Daily   Continuous Infusions:  remdesivir 100 mg in NS 250 mL     PRN Meds:.acetaminophen, albuterol, chlorpheniramine-HYDROcodone, guaiFENesin-dextromethorphan, [DISCONTINUED] ondansetron **OR** ondansetron (ZOFRAN) IV, sodium chloride, traMADol, traZODone  Antibiotics  :    Anti-infectives (From admission, onward)   Start     Dose/Rate Route Frequency Ordered Stop   11/07/19 1000  remdesivir 100 mg in sodium chloride 0.9 % 250 mL IVPB     100 mg 500 mL/hr over 30 Minutes Intravenous Every 24 hours 11/05/19 2340 11/10/19 0959   11/06/19  2200  remdesivir 100 mg in sodium chloride 0.9 % 250 mL IVPB  Status:  Discontinued     100 mg 500 mL/hr over 30 Minutes Intravenous Every 24 hours 11/05/19 1808 11/05/19 2340   11/06/19 1600  remdesivir 100 mg in sodium chloride 0.9 % 250 mL IVPB     100 mg 500 mL/hr over 30 Minutes Intravenous  Once 11/05/19 2340 11/06/19 2000   11/05/19 2000  remdesivir 200 mg in sodium chloride 0.9 % 250 mL IVPB     200 mg 500 mL/hr over 30 Minutes Intravenous Once 11/05/19 1808 11/05/19 2330       Time Spent in minutes  30   Lala Lund M.D on 11/07/2019 at 12:00 PM  To page go to [email or web address] - password [facility]  [facility] -  Office  [phone]  See all Orders from today for further details    Objective:   Vitals:   11/07/19 0000 11/07/19 0340 11/07/19 0900 11/07/19 1048  BP: 127/79 139/89    Pulse:    68 80  Resp: 19 (!) 25    Temp: 98.1 F (36.7 C) 97.6 F (36.4 C)  98.7 F (37.1 C)  TempSrc: Oral Oral  Oral  SpO2: 92% (!) 87%    Weight:      Height:        Wt Readings from Last 3 Encounters:  11/05/19 104.3 kg  02/17/18 104.2 kg  02/14/18 104.2 kg     Intake/Output Summary (Last 24 hours) at 11/07/2019 1200 Last data filed at 11/07/2019 0400 Gross per 24 hour  Intake 1524.87 ml  Output 875 ml  Net 649.87 ml     Physical Exam  Awake Alert, Oriented X 3, No new F.N deficits, Normal affect [place].AT,PERRAL Supple Neck,No JVD, No cervical lymphadenopathy appriciated.  Symmetrical Chest wall movement, Good air movement bilaterally, few rales RRR,No Gallops, Rubs or new Murmurs, No Parasternal Heave +ve B.Sounds, Abd Soft, No tenderness, No organomegaly appriciated, No rebound - guarding or rigidity. No Cyanosis, Clubbing or edema, No new Rash or bruise     Data Review:    CBC Recent Labs  Lab 11/05/19 1524 11/06/19 0255 11/07/19 0245  WBC 11.6* 9.5 8.0  HGB 12.9 12.6 12.4  HCT 40.0 38.7 38.9  PLT 198 227 268  MCV 90.3 89.4 90.7  MCH 29.1 29.1 28.9  MCHC 32.3 32.6 31.9  RDW 13.5 13.6 13.8  LYMPHSABS 1.2 1.0 1.4  MONOABS 0.4 0.2 0.3  EOSABS 0.0 0.0 0.0  BASOSABS 0.0 0.0 0.0    Chemistries  Recent Labs  Lab 11/05/19 1524 11/06/19 0255 11/07/19 0245  NA 133* 135 136  K 4.1 4.3 4.3  CL 99 99 102  CO2 22 21* 21*  GLUCOSE 306* 345* 255*  BUN 18 20 27*  CREATININE 1.01* 0.91 0.89  CALCIUM 9.0 8.9 8.9  MG  --   --  2.3  AST _0 ALT _1 ALKPHOS 65 62 59  BILITOT 0.6 0.2* 0.3   ------------------------------------------------------------------------------------------------------------------ Recent Labs    11/05/19 1528  TRIG 359*    Lab Results  Component Value Date   HGBA1C 9.2 (H) 11/06/2019   ------------------------------------------------------------------------------------------------------------------ No results for  input(s): TSH, T4TOTAL, T3FREE, THYROIDAB in the last 72 hours.  Invalid input(s): FREET3  Cardiac Enzymes Recent Labs  Lab 11/05/19 1530  CKMB 2.8   ------------------------------------------------------------------------------------------------------------------    Component Value Date/Time   BNP 19.4 11/07/2019 0245    Micro Results Recent  Results (from the past 240 hour(s))  Blood Culture (routine x 2)     Status: None (Preliminary result)   Collection Time: 11/05/19  3:01 PM   Specimen: BLOOD  Result Value Ref Range Status   Specimen Description   Final    BLOOD LEFT ANTECUBITAL Performed at [facility] [address] [place], [place] [address]    Special Requests   Final    BOTTLES DRAWN AEROBIC AND ANAEROBIC Blood Culture adequate volume Performed at [facility] [address], [place], [place] [address]    Culture   Final    NO GROWTH 2 DAYS Performed at [facility] [address] [place], [place] [address]    Report Status PENDING  Incomplete  SARS Coronavirus 2 by RT PCR (hospital order, performed in [facility] hospital lab) Nasopharyngeal Nasopharyngeal Swab     Status: Abnormal   Collection Time: 11/05/19  3:07 PM   Specimen: Nasopharyngeal Swab  Result Value Ref Range Status   SARS Coronavirus 2 POSITIVE (A) NEGATIVE Final    Comment: RESULT CALLED TO, READ BACK BY AND VERIFIED WITH: RN H COOK AT 2025 11/05/2019 BY L BENFIELD (NOTE) If result is NEGATIVE SARS-CoV-2 target nucleic acids are NOT DETECTED. The SARS-CoV-2 RNA is generally detectable in upper and lower  respiratory specimens during the acute phase of infection. The lowest  concentration of SARS-CoV-2 viral copies this assay can detect is 250  copies / mL. A negative result does not preclude SARS-CoV-2 infection  and should not be used as the sole basis for treatment or other  patient management decisions.  A negative result may occur with    improper specimen collection / handling, submission of specimen other  than nasopharyngeal swab, presence of viral mutation(s) within the  areas targeted by this assay, and inadequate number of viral copies  (<250 copies / mL). A negative result must be combined with clinical  observations, patient history, and epidemiological information. If result is POSITIVE SARS-CoV-2 target nucleic acids are DETEC TED. The SARS-CoV-2 RNA is generally detectable in upper and lower  respiratory specimens during the acute phase of infection.  Positive  results are indicative of active infection with SARS-CoV-2.  Clinical  correlation with patient history and other diagnostic information is  necessary to determine patient infection status.  Positive results do  not rule out bacterial infection or co-infection with other viruses. If result is PRESUMPTIVE POSTIVE SARS-CoV-2 nucleic acids MAY BE PRESENT.   A presumptive positive result was obtained on the submitted specimen  and confirmed on repeat testing.  While 2019 novel coronavirus  (SARS-CoV-2) nucleic acids may be present in the submitted sample  additional confirmatory testing may be necessary for epidemiological  and / or clinical management purposes  to differentiate between  SARS-CoV-2 and other Sarbecovirus currently known to infect humans.  If clinically indicated additional testing with an alternate test  methodology (LAB7 453) is advised. The SARS-CoV-2 RNA is generally  detectable in upper and lower respiratory specimens during the acute  phase of infection. The expected result is Negative. Fact Sheet for Patients:  StrictlyIdeas.no Fact Sheet for Healthcare Providers: BankingDealers.co.za This test is not yet approved or cleared by the [place] FDA and has been authorized for detection and/or diagnosis of SARS-CoV-2 by FDA under an Emergency Use Authorization (EUA).  This EUA will  remain in effect (meaning this test can be used) for the duration of the COVID-19 declaration under Section 564(b)(1)  of the Act, 21 U.S.C. section 360bbb-3(b)(1), unless the authorization is terminated or revoked sooner. Performed at [facility], [place] [address] [place], [place] [address]   Blood Culture (routine x 2)     Status: None (Preliminary result)   Collection Time: 11/05/19  3:45 PM   Specimen: BLOOD LEFT FOREARM  Result Value Ref Range Status   Specimen Description   Final    BLOOD LEFT FOREARM Performed at [facility], [place] [address], [place], [place] [address]    Special Requests   Final    BOTTLES DRAWN AEROBIC ONLY Blood Culture results may not be optimal due to an inadequate volume of blood received in culture bottles Performed at [facility] [address], [place], [place] [address]    Culture   Final    NO GROWTH 2 DAYS Performed at [facility] [address], [place], [place] [address]    Report Status PENDING  Incomplete    Radiology Reports Dg Chest Port 1 View  Result Date: 11/05/2019 CLINICAL DATA:  Dyspnea EXAM: PORTABLE CHEST 1 VIEW COMPARISON:  None. FINDINGS: Cardiomegaly. Bilateral, predominantly peripheral heterogeneous airspace opacity. The visualized skeletal structures are unremarkable. IMPRESSION: 1. Bilateral, predominantly peripheral heterogeneous airspace opacity, consistent with multifocal infection. 2.  Cardiomegaly. Electronically Signed   By: Eddie Candle [name]   On: 11/05/2019 15:40

## 2019-11-07 NOTE — Evaluation (Signed)
Physical Therapy Evaluation Patient Details Name: Whitney Shelton MRN: [id number] DOB: May 05, 1966 Today's Date: 11/07/2019   History of Present Illness  53 y.o. female with history of asthma, DM-2 and nephrolithiasis presenting with shortness of breath, cough and fever. +COVID; CBGs >400, A1c 9.2  Clinical Impression   Pt admitted with above diagnosis. Patient limited this date by severe migraine causing anxiety/distress and low oxygenation on 15L HFNC initially (84-86% in sititng on arrival). Patient requested to focus on ways to decrease her pain so that she could focus on her breathing and education being provided. After repositioned for comfort, applied ice pack to Left neck/trapezius, then educated on breathing techniques and equipment. Patient with difficulty with pursed lip breathing due to "nose clogged up and so dry." No nasal spray present (later requested RN obtain for pt). Switched pt to pNRB (already at her bedside). Patient able to relax and sats slowly improved 86 to 92%. Educated on further role of PT and to further assess activity tolerance and balance next visit.  Pt currently with functional limitations due to the deficits listed below (see PT Problem List). Pt will benefit from skilled PT to increase their independence and safety with mobility to allow discharge to the venue listed below.       Follow Up Recommendations No PT follow up    Equipment Recommendations  None recommended by PT    Recommendations for Other Services       Precautions / Restrictions Precautions Precautions: None      Mobility  Bed Mobility               General bed mobility comments: up in chair on arrival  Transfers Overall transfer level: Independent                  Ambulation/Gait             General Gait Details: not tested due to severe headache, distress and sats 84-86% on 15L HFNC  Stairs            Wheelchair Mobility    Modified Rankin (Stroke  Patients Only)       Balance                                             Pertinent Vitals/Pain Pain Assessment: 0-10    Home Living Family/patient expects to be discharged to:: Private residence Living Arrangements: Spouse/significant other Available Help at Discharge: Family Type of Home: [place]: One level Home Equipment: None Additional Comments: husband has tested negative x1; she has an adult daughter that is also positive and may stay with her on d/c (would have flight of steps)    Prior Function Level of Independence: Independent         Comments: 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[employer], [employer]        Extremity/Trunk Assessment   Upper Extremity Assessment Upper Extremity Assessment: Defer to OT evaluation    Lower Extremity Assessment Lower Extremity Assessment: Overall WFL for tasks assessed    Cervical / Trunk Assessment Cervical / Trunk Assessment: Other exceptions Cervical / Trunk Exceptions: having severe pain/tightness left upper trapezius with migraine  Communication   Communication: No difficulties  Cognition Arousal/Alertness: Awake/alert Behavior During Therapy: Anxious Overall Cognitive Status: Within Functional Limits  for tasks assessed                                 General Comments: anxious re: migraine and inability to get comfortable and focus on her breathing      General Comments General comments (skin integrity, edema, etc.): Patient repositioned with pillow under her bottom, reclined, ice pack to left upper trap/neck, and neck pillow to assist with decreasing headache, anxiety, and improve breathing.     Exercises Other Exercises Other Exercises: Educated in proper use of IS (pt can pull 1250 ml) x 3 reps with coughing elicited each breath; educated on frequency 10x/hour; educated on flutter valve   Assessment/Plan    PT Assessment Patient needs continued PT services   PT Problem List Decreased activity tolerance;Decreased mobility;Cardiopulmonary status limiting activity;Pain       PT Treatment Interventions Gait training;Functional mobility training;Therapeutic activities;Therapeutic exercise;Patient/family education    PT Goals (Current goals can be found in the Care Plan section)  Acute Rehab PT Goals Patient Stated Goal: get better and get home PT Goal Formulation: With patient Time For Goal Achievement: 11/21/19 Potential to Achieve Goals: Good    Frequency Min 3X/week   Barriers to discharge        Co-evaluation               AM-PAC PT "6 Clicks" Mobility  Outcome Measure Help needed turning from your back to your side while in a flat bed without using bedrails?: None Help needed moving from lying on your back to sitting on the side of a flat bed without using bedrails?: None Help needed moving to and from a bed to a chair (including a wheelchair)?: None Help needed standing up from a chair using your arms (e.g., wheelchair or bedside chair)?: None Help needed to walk in hospital room?: None Help needed climbing 3-5 steps with a railing? : None 6 Click Score: 24    End of Session Equipment Utilized During Treatment: Oxygen Activity Tolerance: Patient limited by pain;Treatment limited secondary to medical complications (Comment) Patient left: in chair;with call bell/phone within reach Nurse Communication: Other (comment);Patient requests pain meds(needs nasal spray; difficulty breathing thru nose; pNRB plac) PT Visit Diagnosis: Difficulty in walking, not elsewhere classified (R26.2)    Time: [id number] PT Time Calculation (min) (ACUTE ONLY): 23 min   Charges:   PT Evaluation $PT Eval Low Complexity: 1 Low           Whitney Shelton, PT    [place] Whitney Shelton 11/07/2019, 12:05 PM

## 2019-11-07 NOTE — Progress Notes (Signed)
Pt oxygen demands increasing this AM with saturations maintaining in the mid to low 80's for an extended period while patient on left side and supine in bed (head of bed elevated). Pt unable to tolerate proning at this time with significant anxiety and headache with attempts. O2 increased to 15L HFNC and patient encouraged to lay on right side proned as much as possible. She expressed understanding. Oxygen saturations currently in the low 90's in this position. Will monitor.

## 2019-11-08 DIAGNOSIS — J9691 Respiratory failure, unspecified with hypoxia: Secondary | ICD-10-CM

## 2019-11-08 DIAGNOSIS — E119 Type 2 diabetes mellitus without complications: Secondary | ICD-10-CM

## 2019-11-08 DIAGNOSIS — E669 Obesity, unspecified: Secondary | ICD-10-CM

## 2019-11-08 LAB — COMPREHENSIVE METABOLIC PANEL
ALT: 24 U/L (ref 0–44)
AST: 22 U/L (ref 15–41)
Albumin: 3.1 g/dL — ABNORMAL LOW (ref 3.5–5.0)
Alkaline Phosphatase: 58 U/L (ref 38–126)
Anion gap: 13 (ref 5–15)
BUN: 31 mg/dL — ABNORMAL HIGH (ref 6–20)
CO2: 22 mmol/L (ref 22–32)
Calcium: 8.8 mg/dL — ABNORMAL LOW (ref 8.9–10.3)
Chloride: 102 mmol/L (ref 98–111)
Creatinine, Ser: 0.84 mg/dL (ref 0.44–1.00)
GFR calc Af Amer: >60 mL/min (ref >60)
GFR calc non Af Amer: >60 mL/min (ref >60)
Glucose, Bld: 247 mg/dL — ABNORMAL HIGH (ref 70–99)
Potassium: 4.3 mmol/L (ref 3.5–5.1)
Sodium: 137 mmol/L (ref 135–145)
Total Bilirubin: 0.4 mg/dL (ref 0.3–1.2)
Total Protein: 7.1 g/dL (ref 6.5–8.1)

## 2019-11-08 LAB — CBC WITH DIFFERENTIAL/PLATELET
Abs Immature Granulocytes: 0.54 10*3/uL — ABNORMAL HIGH (ref 0.00–0.07)
Basophils Absolute: 0.1 10*3/uL (ref 0.0–0.1)
Basophils Relative: 1 %
Eosinophils Absolute: 0 10*3/uL (ref 0.0–0.5)
Eosinophils Relative: 0 %
HCT: 39.8 % (ref 36.0–46.0)
Hemoglobin: 12.6 g/dL (ref 12.0–15.0)
Immature Granulocytes: 5 %
Lymphocytes Relative: 16 %
Lymphs Abs: 1.8 10*3/uL (ref 0.7–4.0)
MCH: 28.6 pg (ref 26.0–34.0)
MCHC: 31.7 g/dL (ref 30.0–36.0)
MCV: 90.5 fL (ref 80.0–100.0)
Monocytes Absolute: 0.5 10*3/uL (ref 0.1–1.0)
Monocytes Relative: 4 %
Neutro Abs: 8.2 10*3/uL — ABNORMAL HIGH (ref 1.7–7.7)
Neutrophils Relative %: 74 %
Platelets: 353 10*3/uL (ref 150–400)
RBC: 4.4 MIL/uL (ref 3.87–5.11)
RDW: 13.4 % (ref 11.5–15.5)
WBC: 11.1 10*3/uL — ABNORMAL HIGH (ref 4.0–10.5)
nRBC: 0 % (ref 0.0–0.2)

## 2019-11-08 LAB — MAGNESIUM: Magnesium: 2.2 mg/dL (ref 1.7–2.4)

## 2019-11-08 LAB — D-DIMER, QUANTITATIVE: D-Dimer, Quant: 0.37 ug/mL-FEU (ref 0.00–0.50)

## 2019-11-08 LAB — GLUCOSE, CAPILLARY
Glucose-Capillary: 223 mg/dL — ABNORMAL HIGH (ref 70–99)
Glucose-Capillary: 205 mg/dL — ABNORMAL HIGH (ref 70–99)
Glucose-Capillary: 259 mg/dL — ABNORMAL HIGH (ref 70–99)

## 2019-11-08 LAB — BRAIN NATRIURETIC PEPTIDE: B Natriuretic Peptide: 32.2 pg/mL (ref 0.0–100.0)

## 2019-11-08 LAB — PROCALCITONIN: Procalcitonin: <0.1 ng/mL

## 2019-11-08 LAB — C-REACTIVE PROTEIN: CRP: 8.7 mg/dL — ABNORMAL HIGH (ref <1.0)

## 2019-11-08 MED ORDER — CLONAZEPAM 0.5 MG PO TABS
0.5000 mg | ORAL_TABLET | Freq: Two times a day (BID) | ORAL | Status: DC | PRN
  Administered 2019-11-08: 0.5 mg via ORAL
  Filled 2019-11-08: qty 1

## 2019-11-08 MED ORDER — SODIUM CHLORIDE 0.9 % IV BOLUS
500.0000 mL | Freq: Once | INTRAVENOUS | Status: AC
  Administered 2019-11-08: 500 mL via INTRAVENOUS

## 2019-11-08 MED ORDER — INSULIN GLARGINE 100 UNIT/ML ~~LOC~~ SOLN
30.0000 [IU] | Freq: Two times a day (BID) | SUBCUTANEOUS | Status: DC
  Administered 2019-11-08 – 2019-11-12 (×8): 30 [IU] via SUBCUTANEOUS
  Filled 2019-11-08 (×9): qty 0.3

## 2019-11-08 NOTE — Consult Note (Addendum)
WOC consult requested for sacrum; pt is on isolation for Covid.  Discussed location appearance via phone call with the bedside nurse owho assessed the sacrum and he states the skin is intact without a wound but the patient is c/o soreness to this site. Topical treatment orders provided for bedside nurses to  apply foam dressing to reduce pressure and decrease discomfort.   Please re-consult if further assistance is needed.  Thank-you,  Julien Girt MSN, [place], [place], [place], [place]

## 2019-11-08 NOTE — Progress Notes (Signed)
[facility] PROGRESS NOTE    Progress Note  Whitney Shelton  [id number] DOB: 01/17/1966 DOA: 11/05/2019 PCP: Glendon Axe, MD     Brief Narrative:   Whitney Shelton is an 53 y.o. female past medical history of diabetes mellitus type 2, nephrolithiasis comes in for shortness of breath, fever for about 1 week prior to admission.  She was diagnosed with COVID-19 infection over at [facility] clinic few days ago came to the ED was found to be hypoxic placed on 6 L, but the following day had to be placed on 12 to keep saturation above 92% history of PE keep arising.  Assessment/Plan:   Acute respiratory failure with hypoxia  Pneumonia due to COVID-19 virus She is requiring 15 L to keep saturations greater than 92%. She has received 2 doses of Actemra, she is currently on IV remdesivir and IV steroids. She relates she feels tired and fatigued.  Inflammatory markers are improving. Cannot sit up on the chair as her sacrum bothers her. I talked to the nurse to try to get a doughnut for her. Cannot prone as she relates it bothers her back.  Obesity (BMI 30-39.9) Be as an outpatient.  Type 2 diabetes mellitus without complication ([facility]): Blood glucose was around 400, has improved with long-acting insulin plus sliding scale. Blood glucose remain greater than 200. We will increase her long-acting continue sliding scale insulin. Her A1c was 9.2.   DVT prophylaxis: lovenox Family Communication:none Disposition Plan/Barrier to D/C: unable to determine Code Status:     Code Status Orders  (From admission, onward)         Start     Ordered   11/05/19 2330  Full code  Continuous     11/05/19 2329        Code Status History    Date Active Date Inactive Code Status Order ID Comments User Context   02/17/2018 1304 02/18/2018 1145 Full Code [id number]  Brien Few, MD Inpatient   08/11/2017 1330 08/11/2017 2144 Full Code [id number]  Franchot Gallo, MD Inpatient   Advance Care  Planning Activity        IV Access:    Peripheral IV   Procedures and diagnostic studies:   No results found.   Medical Consultants:    None.  Anti-Infectives:   IV remdesivir  Subjective:    Whitney Shelton relates she feels tired and fatigue, she is also complaining of pain in her back specially in her sacrum.   Objective:    Vitals:   11/07/19 2314 11/08/19 0349 11/08/19 0823 11/08/19 1153  BP: (!) 144/94 (!) 138/92 140/79 119/81  Pulse: 76  65 66  Resp: 16 16 15 15   Temp: 98 F (36.7 C) 97.8 F (36.6 C) 97.6 F (36.4 C) 97.9 F (36.6 C)  TempSrc: Oral Oral Axillary Oral  SpO2: 92% 92% 95% 92%  Weight:      Height:       SpO2: 92 % O2 Flow Rate (L/min): 15 L/min FiO2 (%): (!) 9 %   Intake/Output Summary (Last 24 hours) at 11/08/2019 1302 Last data filed at 11/07/2019 1933 Gross per 24 hour  Intake 370 ml  Output 425 ml  Net -55 ml   Filed Weights   11/05/19 1421  Weight: 104.3 kg    Exam: General exam: In no acute distress. Respiratory system: Good air movement and diffuse crackles bilaterally. Cardiovascular system: S1 & S2 heard, RRR. No JVD. Gastrointestinal system: Abdomen is nondistended, soft  and nontender.  Central nervous system: Alert and oriented. No focal neurological deficits. Extremities: No pedal edema. Skin: No rashes, lesions or ulcers Psychiatry: Judgement and insight appear normal. Mood & affect appropriate.   Data Reviewed:    Labs: Basic Metabolic Panel: Recent Labs  Lab 11/05/19 1524 11/06/19 0255 11/07/19 0245 11/08/19 0500  NA 133* 135 136 137  K 4.1 4.3 4.3 4.3  CL 99 99 102 102  CO2 22 21* 21* 22  GLUCOSE 306* 345* 255* 247*  BUN 18 20 27* 31*  CREATININE 1.01* 0.91 0.89 0.84  CALCIUM 9.0 8.9 8.9 8.8*  MG  --   --  2.3 2.2   GFR Estimated Creatinine Clearance: 94.5 mL/min (by C-G formula based on SCr of 0.84 mg/dL). Liver Function Tests: Recent Labs  Lab 11/05/19 1524 11/06/19 0255 11/07/19  0245 11/08/19 0500  AST 26 23 22 22   ALT 27 24 23 24   ALKPHOS 65 62 59 58  BILITOT 0.6 0.2* 0.3 0.4  PROT 7.8 7.5 7.2 7.1  ALBUMIN 3.6 3.4* 3.1* 3.1*   No results for input(s): LIPASE, AMYLASE in the last 168 hours. No results for input(s): AMMONIA in the last 168 hours. Coagulation profile No results for input(s): INR, PROTIME in the last 168 hours. COVID-19 Labs  Recent Labs    11/05/19 1524 11/05/19 1527  11/06/19 0255 11/07/19 0245 11/08/19 0500  DDIMER 0.43  --   --  0.47 0.42 0.37  FERRITIN  --  217  --  251  --   --   LDH 261*  --   --   --   --   --   CRP  --  17.8*   < > 21.8* 17.5* 8.7*   < > = values in this interval not displayed.    Lab Results  Component Value Date   SARSCOV2NAA POSITIVE (A) 11/05/2019    CBC: Recent Labs  Lab 11/05/19 1524 11/06/19 0255 11/07/19 0245 11/08/19 0500  WBC 11.6* 9.5 8.0 11.1*  NEUTROABS 9.7* 8.2* 6.1 8.2*  HGB 12.9 12.6 12.4 12.6  HCT 40.0 38.7 38.9 39.8  MCV 90.3 89.4 90.7 90.5  PLT 198 227 268 353   Cardiac Enzymes: Recent Labs  Lab 11/05/19 1530  CKTOTAL 170  CKMB 2.8   BNP (last 3 results) No results for input(s): PROBNP in the last 8760 hours. CBG: Recent Labs  Lab 11/07/19 1151 11/07/19 1701 11/07/19 2037 11/08/19 0721 11/08/19 1150  GLUCAP 276* 237* 269* 223* 205*   D-Dimer: Recent Labs    11/07/19 0245 11/08/19 0500  DDIMER 0.42 0.37   Hgb A1c: Recent Labs    11/06/19 0255  HGBA1C 9.2*   Lipid Profile: Recent Labs    11/05/19 1528  TRIG 359*   Thyroid function studies: No results for input(s): TSH, T4TOTAL, T3FREE, THYROIDAB in the last 72 hours.  Invalid input(s): FREET3 Anemia work up: Recent Labs    11/05/19 1527 11/06/19 0255  FERRITIN 217 251   Sepsis Labs: Recent Labs  Lab 11/05/19 1524 11/05/19 1531 11/05/19 1715 11/05/19 1716 11/06/19 0255 11/07/19 0245 11/08/19 0500  PROCALCITON 0.11  --  <0.10  --  0.16 <0.10 <0.10  WBC 11.6*  --   --   --  9.5  8.0 11.1*  LATICACIDVEN  --  2.0*  --  1.8  --   --   --    Microbiology Recent Results (from the past 240 hour(s))  Blood Culture (routine x 2)  Status: None (Preliminary result)   Collection Time: 11/05/19  3:01 PM   Specimen: BLOOD  Result Value Ref Range Status   Specimen Description   Final    BLOOD LEFT ANTECUBITAL Performed at [facility] [address], [place], [place] [address]    Special Requests   Final    BOTTLES DRAWN AEROBIC AND ANAEROBIC Blood Culture adequate volume Performed at [facility] [address] [place], [place] [address]    Culture   Final    NO GROWTH 3 DAYS Performed at [facility], [place] [address] [place], [place] [address]    Report Status PENDING  Incomplete  SARS Coronavirus 2 by RT PCR (hospital order, performed in [facility] hospital lab) Nasopharyngeal Nasopharyngeal Swab     Status: Abnormal   Collection Time: 11/05/19  3:07 PM   Specimen: Nasopharyngeal Swab  Result Value Ref Range Status   SARS Coronavirus 2 POSITIVE (A) NEGATIVE Final    Comment: RESULT CALLED TO, READ BACK BY AND VERIFIED WITH: RN H COOK AT 2025 11/05/2019 BY L BENFIELD (NOTE) If result is NEGATIVE SARS-CoV-2 target nucleic acids are NOT DETECTED. The SARS-CoV-2 RNA is generally detectable in upper and lower  respiratory specimens during the acute phase of infection. The lowest  concentration of SARS-CoV-2 viral copies this assay can detect is 250  copies / mL. A negative result does not preclude SARS-CoV-2 infection  and should not be used as the sole basis for treatment or other  patient management decisions.  A negative result may occur with  improper specimen collection / handling, submission of specimen other  than nasopharyngeal swab, presence of viral mutation(s) within the  areas targeted by this assay, and inadequate number of viral copies  (<250 copies / mL). A negative result must be combined with  clinical  observations, patient history, and epidemiological information. If result is POSITIVE SARS-CoV-2 target nucleic acids are DETEC TED. The SARS-CoV-2 RNA is generally detectable in upper and lower  respiratory specimens during the acute phase of infection.  Positive  results are indicative of active infection with SARS-CoV-2.  Clinical  correlation with patient history and other diagnostic information is  necessary to determine patient infection status.  Positive results do  not rule out bacterial infection or co-infection with other viruses. If result is PRESUMPTIVE POSTIVE SARS-CoV-2 nucleic acids MAY BE PRESENT.   A presumptive positive result was obtained on the submitted specimen  and confirmed on repeat testing.  While 2019 novel coronavirus  (SARS-CoV-2) nucleic acids may be present in the submitted sample  additional confirmatory testing may be necessary for epidemiological  and / or clinical management purposes  to differentiate between  SARS-CoV-2 and other Sarbecovirus currently known to infect humans.  If clinically indicated additional testing with an alternate test  methodology (LAB7 453) is advised. The SARS-CoV-2 RNA is generally  detectable in upper and lower respiratory specimens during the acute  phase of infection. The expected result is Negative. Fact Sheet for Patients:  StrictlyIdeas.no Fact Sheet for Healthcare Providers: BankingDealers.co.za This test is not yet approved or cleared by the [place] FDA and has been authorized for detection and/or diagnosis of SARS-CoV-2 by FDA under an Emergency Use Authorization (EUA).  This EUA will remain in effect (meaning this test can be used) for the duration of the COVID-19 declaration under Section 564(b)(1) of the Act, 21 U.S.C. section 360bbb-3(b)(1), unless the authorization is terminated or revoked sooner. Performed at [facility]  [facility], [place]  [address], [place], [place] [address]   Blood Culture (routine x 2)     Status: None (Preliminary result)   Collection Time: 11/05/19  3:45 PM   Specimen: BLOOD LEFT FOREARM  Result Value Ref Range Status   Specimen Description   Final    BLOOD LEFT FOREARM Performed at [facility], [place] [address], [place], [place] [address]    Special Requests   Final    BOTTLES DRAWN AEROBIC ONLY Blood Culture results may not be optimal due to an inadequate volume of blood received in culture bottles Performed at [facility] [address], [place], [place] [address]    Culture   Final    NO GROWTH 3 DAYS Performed at [facility], [place] [address], [place], [place] [address]    Report Status PENDING  Incomplete     Medications:   . allopurinol  100 mg Oral QHS  . atorvastatin  10 mg Oral QHS  . enoxaparin (LOVENOX) injection  50 mg Subcutaneous Q24H  . guaiFENesin  600 mg Oral BID  . insulin aspart  0-20 Units Subcutaneous TID WC  . insulin aspart  0-5 Units Subcutaneous QHS  . insulin aspart  6 Units Subcutaneous TID WC  . insulin glargine  50 Units Subcutaneous Daily  . methylPREDNISolone (SOLU-MEDROL) injection  40 mg Intravenous Q12H  . mometasone-formoterol  2 puff Inhalation BID  . vitamin C  500 mg Oral Daily  . zinc sulfate  220 mg Oral Daily   Continuous Infusions: . remdesivir 100 mg in NS 250 mL 100 mg (11/08/19 1056)      LOS: 3 days   Charlynne Cousins  [facility]  11/08/2019, 1:02 PM

## 2019-11-08 NOTE — Progress Notes (Signed)
Physical Therapy Treatment Patient Details Name: Whitney Shelton MRN: [id number] DOB: 1966-04-21 Today's Date: 11/08/2019    History of Present Illness 53 y.o. female with history of asthma, DM-2 and nephrolithiasis presenting with shortness of breath, cough and fever. +COVID; CBGs >400, A1c 9.2    PT Comments    Pt making progress with mobility today. Arrived in room and pt sitting EOB states she is ready to get moving, usually does not sit around at home. She reports that she has some pain in back with sitting in recliner too long and has requested spouse bring cushion to help with this. Spouse has brought several cushions to date. This morning was able to ambulate approx 432ft with SBA and line management and maintain sats in low 90s, 02 measured via Nelcor finger probe and pedi finger probes. Towards end of distance states she is starting to feel fatigued. Will continue to monitor 02 sats and titrate as pt tolerates.   Follow Up Recommendations  No PT follow up     Equipment Recommendations  None recommended by PT    Recommendations for Other Services       Precautions / Restrictions Precautions Precautions: None Restrictions Weight Bearing Restrictions: No    Mobility  Bed Mobility               General bed mobility comments: sitting EOB upon arrival  Transfers Overall transfer level: Modified independent Equipment used: None             General transfer comment: moves around from bed to recliner with mod I, states prefers to be up and moving  Ambulation/Gait Ambulation/Gait assistance: Supervision Gait Distance (Feet): 400 Feet Assistive device: None Gait Pattern/deviations: Step-through pattern Gait velocity: fair   General Gait Details: Pt states has been ambulating in past two days with nursing, has ambulated same distance as today. pt on 10L.min via HFNC and sats in 90s with ambulation, measured on Nelcor finger probe and pedi probe interchangebly     Stairs             Wheelchair Mobility    Modified Rankin (Stroke Patients Only)       Balance Overall balance assessment: Modified Independent                                          Cognition Arousal/Alertness: Awake/alert Behavior During Therapy: Restless Overall Cognitive Status: Within Functional Limits for tasks assessed                                 General Comments: states gets pain in back and bottom from sitting in recliner too long, has asked for cushion for this and spouse has brought in coushions for her to sit on      Exercises      General Comments General comments (skin integrity, edema, etc.): Pt seems to be doing much better with mobility today and is tolerating more but is still on 10Lmin via HFNC      Pertinent Vitals/Pain Pain Assessment: No/denies pain    Home Living                      Prior Function            PT Goals (current goals can now be found in  the care plan section) Acute Rehab PT Goals Patient Stated Goal: go outside PT Goal Formulation: With patient Time For Goal Achievement: 11/21/19 Potential to Achieve Goals: Good Progress towards PT goals: Progressing toward goals    Frequency    Min 3X/week      PT Plan Current plan remains appropriate    Co-evaluation              AM-PAC PT "6 Clicks" Mobility   Outcome Measure  Help needed turning from your back to your side while in a flat bed without using bedrails?: None Help needed moving from lying on your back to sitting on the side of a flat bed without using bedrails?: None Help needed moving to and from a bed to a chair (including a wheelchair)?: None Help needed standing up from a chair using your arms (e.g., wheelchair or bedside chair)?: None Help needed to walk in hospital room?: A Little Help needed climbing 3-5 steps with a railing? : None 6 Click Score: 23    End of Session Equipment Utilized  During Treatment: Oxygen Activity Tolerance: Patient tolerated treatment well Patient left: in chair;with call bell/phone within reach Nurse Communication: Mobility status PT Visit Diagnosis: Difficulty in walking, not elsewhere classified (R26.2)     Time: [id number] PT Time Calculation (min) (ACUTE ONLY): 31 min  Charges:  $Gait Training: 23-37 mins                     Horald Chestnut, PT    Delford Field 11/08/2019, 1:25 PM

## 2019-11-09 LAB — CBC WITH DIFFERENTIAL/PLATELET
Abs Immature Granulocytes: 0.93 10*3/uL — ABNORMAL HIGH (ref 0.00–0.07)
Basophils Absolute: 0.1 10*3/uL (ref 0.0–0.1)
Basophils Relative: 1 %
Eosinophils Absolute: 0 10*3/uL (ref 0.0–0.5)
Eosinophils Relative: 0 %
HCT: 38.6 % (ref 36.0–46.0)
Hemoglobin: 12.3 g/dL (ref 12.0–15.0)
Immature Granulocytes: 7 %
Lymphocytes Relative: 12 %
Lymphs Abs: 1.5 10*3/uL (ref 0.7–4.0)
MCH: 28.9 pg (ref 26.0–34.0)
MCHC: 31.9 g/dL (ref 30.0–36.0)
MCV: 90.8 fL (ref 80.0–100.0)
Monocytes Absolute: 0.6 10*3/uL (ref 0.1–1.0)
Monocytes Relative: 4 %
Neutro Abs: 9.6 10*3/uL — ABNORMAL HIGH (ref 1.7–7.7)
Neutrophils Relative %: 76 %
Platelets: 369 10*3/uL (ref 150–400)
RBC: 4.25 MIL/uL (ref 3.87–5.11)
RDW: 13.5 % (ref 11.5–15.5)
WBC: 12.7 10*3/uL — ABNORMAL HIGH (ref 4.0–10.5)
nRBC: 0 % (ref 0.0–0.2)

## 2019-11-09 LAB — COMPREHENSIVE METABOLIC PANEL
ALT: 27 U/L (ref 0–44)
AST: 29 U/L (ref 15–41)
Albumin: 3 g/dL — ABNORMAL LOW (ref 3.5–5.0)
Alkaline Phosphatase: 55 U/L (ref 38–126)
Anion gap: 13 (ref 5–15)
BUN: 24 mg/dL — ABNORMAL HIGH (ref 6–20)
CO2: 20 mmol/L — ABNORMAL LOW (ref 22–32)
Calcium: 8.7 mg/dL — ABNORMAL LOW (ref 8.9–10.3)
Chloride: 104 mmol/L (ref 98–111)
Creatinine, Ser: 0.79 mg/dL (ref 0.44–1.00)
GFR calc Af Amer: >60 mL/min (ref >60)
GFR calc non Af Amer: >60 mL/min (ref >60)
Glucose, Bld: 158 mg/dL — ABNORMAL HIGH (ref 70–99)
Potassium: 4.3 mmol/L (ref 3.5–5.1)
Sodium: 137 mmol/L (ref 135–145)
Total Bilirubin: 0.4 mg/dL (ref 0.3–1.2)
Total Protein: 6.6 g/dL (ref 6.5–8.1)

## 2019-11-09 LAB — MAGNESIUM: Magnesium: 2.3 mg/dL (ref 1.7–2.4)

## 2019-11-09 LAB — GLUCOSE, CAPILLARY
Glucose-Capillary: 266 mg/dL — ABNORMAL HIGH (ref 70–99)
Glucose-Capillary: 160 mg/dL — ABNORMAL HIGH (ref 70–99)
Glucose-Capillary: 288 mg/dL — ABNORMAL HIGH (ref 70–99)
Glucose-Capillary: 252 mg/dL — ABNORMAL HIGH (ref 70–99)
Glucose-Capillary: 220 mg/dL — ABNORMAL HIGH (ref 70–99)

## 2019-11-09 LAB — C-REACTIVE PROTEIN: CRP: 4.6 mg/dL — ABNORMAL HIGH (ref <1.0)

## 2019-11-09 LAB — D-DIMER, QUANTITATIVE: D-Dimer, Quant: 0.31 ug/mL-FEU (ref 0.00–0.50)

## 2019-11-09 LAB — BRAIN NATRIURETIC PEPTIDE: B Natriuretic Peptide: 37.3 pg/mL (ref 0.0–100.0)

## 2019-11-09 LAB — PROCALCITONIN: Procalcitonin: <0.1 ng/mL

## 2019-11-09 MED ORDER — POLYETHYLENE GLYCOL 3350 17 G PO PACK
17.0000 g | PACK | Freq: Two times a day (BID) | ORAL | Status: AC
  Administered 2019-11-09 (×2): 17 g via ORAL
  Filled 2019-11-09 (×2): qty 1

## 2019-11-09 MED ORDER — BISACODYL 10 MG RE SUPP
10.0000 mg | Freq: Every day | RECTAL | Status: DC | PRN
  Administered 2019-11-10: 22:00:00 10 mg via RECTAL
  Filled 2019-11-09 (×2): qty 1

## 2019-11-09 NOTE — Progress Notes (Signed)
Inpatient Diabetes Program Recommendations  AACE/ADA: New Consensus Statement on Inpatient Glycemic Control (2015)  Target Ranges:  Prepandial:   less than 140 mg/dL      Peak postprandial:   less than 180 mg/dL (1-2 hours)      Critically ill patients:  140 - 180 mg/dL   Lab Results  Component Value Date   GLUCAP 288 (H) 11/09/2019   HGBA1C 9.2 (H) 11/06/2019    Review of Glycemic Control Results for Whitney Shelton, Whitney Shelton (MRN [id number]) as of 11/09/2019 12:18  Ref. Range 11/08/2019 11:50 11/08/2019 16:30 11/08/2019 21:31 11/09/2019 08:00 11/09/2019 11:52  Glucose-Capillary Latest Ref Range: 70 - 99 mg/dL 205 (H) 259 (H) 266 (H) 160 (H) 288 (H)   Inpatient Diabetes Program Recommendations:   Please consider increase in Novolog meal coverage to 8 units tid if eats 50%.  Thank you, Nani Gasser. Aliah Eriksson, RN, MSN, CDE  Diabetes Coordinator Inpatient Glycemic Control Team Team Pager [phone] (8am-5pm) 11/09/2019 12:18 PM

## 2019-11-09 NOTE — Progress Notes (Signed)
[facility] PROGRESS NOTE    Progress Note  Whitney Shelton  [id number] DOB: May 01, 1966 DOA: 11/05/2019 PCP: Glendon Axe, MD     Brief Narrative:   Whitney Shelton is an 53 y.o. female past medical history of diabetes mellitus type 2, nephrolithiasis comes in for shortness of breath, fever for about 1 week prior to admission.  She was diagnosed with COVID-19 infection over at [facility] clinic few days ago came to the ED was found to be hypoxic placed on 6 L, but the following day had to be placed on 12 to keep saturation above 92% history of PE keep arising.  Assessment/Plan:   Acute respiratory failure with hypoxia  Pneumonia due to COVID-19 virus She is currently requiring anywhere from 6 to 12 L, she is being switched from high flow nasal cannula to nonrebreather. Status post 2 doses of Actemra, she is currently on IV remdesivir and steroids. Inflammatory markers are significantly improved. Continues to be less than 0.1. Mild increase in her white count is likely due to steroids. She relates her breathing is better today, she is more comfortable than yesterday.  Obesity (BMI 30-39.9) Be as an outpatient.  Type 2 diabetes mellitus without complication (Vivian): Blood glucoses continues to improve very nicely, continue long-acting insulin plus sliding scale. Her A1c was 9.2 on admission.   DVT prophylaxis: lovenox Family Communication:none Disposition Plan/Barrier to D/C: unable to determine Code Status:     Code Status Orders  (From admission, onward)         Start     Ordered   11/05/19 2330  Full code  Continuous     11/05/19 2329        Code Status History    Date Active Date Inactive Code Status Order ID Comments User Context   02/17/2018 1304 02/18/2018 1145 Full Code [id number]  Brien Few, MD Inpatient   08/11/2017 1330 08/11/2017 2144 Full Code [id number]  Franchot Gallo, MD Inpatient   Advance Care Planning Activity        IV Access:    Peripheral IV   Procedures and diagnostic studies:   No results found.   Medical Consultants:    None.  Anti-Infectives:   IV remdesivir  Subjective:    [name] relates she does not feel this fatigue as yesterday.   Objective:    Vitals:   11/08/19 1629 11/08/19 1930 11/08/19 2345 11/09/19 0520  BP: 138/89 (!) 138/94 (!) 153/97 133/81  Pulse: 72 80    Resp: 20 20  20   Temp: 98.2 F (36.8 C) 98.4 F (36.9 C) 98.6 F (37 C) 98.5 F (36.9 C)  TempSrc: Oral Oral Oral Oral  SpO2: 90% (!) 88% 90% 95%  Weight:      Height:       SpO2: 95 % O2 Flow Rate (L/min): 12 L/min FiO2 (%): (!) 9 %   Intake/Output Summary (Last 24 hours) at 11/09/2019 0749 Last data filed at 11/08/2019 2014 Gross per 24 hour  Intake 240 ml  Output -  Net 240 ml   Filed Weights   11/05/19 1421  Weight: 104.3 kg    Exam: General exam: In no acute distress, in a better mood today. Respiratory system: Good air movement and diffuse crackles bilaterally. Cardiovascular system: S1 & S2 heard, RRR. No JVD. Gastrointestinal system: Abdomen is nondistended, soft and nontender.  Central nervous system: Alert and oriented. No focal neurological deficits. Extremities: No pedal edema. Skin: No rashes, lesions  or ulcers Psychiatry: Judgement and insight appear normal. Mood & affect appropriate.    Data Reviewed:    Labs: Basic Metabolic Panel: Recent Labs  Lab 11/05/19 1524 11/06/19 0255 11/07/19 0245 11/08/19 0500 11/09/19 0312  NA 133* 135 136 137 137  K 4.1 4.3 4.3 4.3 4.3  CL 99 99 102 102 104  CO2 22 21* 21* 22 20*  GLUCOSE 306* 345* 255* 247* 158*  BUN 18 20 27* 31* 24*  CREATININE 1.01* 0.91 0.89 0.84 0.79  CALCIUM 9.0 8.9 8.9 8.8* 8.7*  MG  --   --  2.3 2.2 2.3   GFR Estimated Creatinine Clearance: 99.2 mL/min (by C-G formula based on SCr of 0.79 mg/dL). Liver Function Tests: Recent Labs  Lab 11/05/19 1524 11/06/19 0255 11/07/19 0245 11/08/19 0500  11/09/19 0312  AST 26 23 22 22 29   ALT 27 24 23 24 27   ALKPHOS 65 62 59 58 55  BILITOT 0.6 0.2* 0.3 0.4 0.4  PROT 7.8 7.5 7.2 7.1 6.6  ALBUMIN 3.6 3.4* 3.1* 3.1* 3.0*   No results for input(s): LIPASE, AMYLASE in the last 168 hours. No results for input(s): AMMONIA in the last 168 hours. Coagulation profile No results for input(s): INR, PROTIME in the last 168 hours. COVID-19 Labs  Recent Labs    11/07/19 0245 11/08/19 0500 11/09/19 0312  DDIMER 0.42 0.37 0.31  CRP 17.5* 8.7* 4.6*    Lab Results  Component Value Date   SARSCOV2NAA POSITIVE (A) 11/05/2019    CBC: Recent Labs  Lab 11/05/19 1524 11/06/19 0255 11/07/19 0245 11/08/19 0500 11/09/19 0312  WBC 11.6* 9.5 8.0 11.1* 12.7*  NEUTROABS 9.7* 8.2* 6.1 8.2* 9.6*  HGB 12.9 12.6 12.4 12.6 12.3  HCT 40.0 38.7 38.9 39.8 38.6  MCV 90.3 89.4 90.7 90.5 90.8  PLT 198 227 268 353 369   Cardiac Enzymes: Recent Labs  Lab 11/05/19 1530  CKTOTAL 170  CKMB 2.8   BNP (last 3 results) No results for input(s): PROBNP in the last 8760 hours. CBG: Recent Labs  Lab 11/07/19 1701 11/07/19 2037 11/08/19 0721 11/08/19 1150 11/08/19 1630  GLUCAP 237* 269* 223* 205* 259*   D-Dimer: Recent Labs    11/08/19 0500 11/09/19 0312  DDIMER 0.37 0.31   Hgb A1c: No results for input(s): HGBA1C in the last 72 hours. Lipid Profile: No results for input(s): CHOL, HDL, LDLCALC, TRIG, CHOLHDL, LDLDIRECT in the last 72 hours. Thyroid function studies: No results for input(s): TSH, T4TOTAL, T3FREE, THYROIDAB in the last 72 hours.  Invalid input(s): FREET3 Anemia work up: No results for input(s): VITAMINB12, FOLATE, FERRITIN, TIBC, IRON, RETICCTPCT in the last 72 hours. Sepsis Labs: Recent Labs  Lab 11/05/19 1531  11/05/19 1716 11/06/19 0255 11/07/19 0245 11/08/19 0500 11/09/19 0312  PROCALCITON  --    < >  --  0.16 <0.10 <0.10 <0.10  WBC  --   --   --  9.5 8.0 11.1* 12.7*  LATICACIDVEN 2.0*  --  1.8  --   --   --   --     < > = values in this interval not displayed.   Microbiology Recent Results (from the past 240 hour(s))  Blood Culture (routine x 2)     Status: None (Preliminary result)   Collection Time: 11/05/19  3:01 PM   Specimen: BLOOD  Result Value Ref Range Status   Specimen Description   Final    BLOOD LEFT ANTECUBITAL Performed at [facility]  [address], [place], [place] [address]    Special Requests   Final    BOTTLES DRAWN AEROBIC AND ANAEROBIC Blood Culture adequate volume Performed at [facility] [address], [place], [place] [address]    Culture   Final    NO GROWTH 3 DAYS Performed at [facility], [place] [address], [place], [place] [address]    Report Status PENDING  Incomplete  SARS Coronavirus 2 by RT PCR (hospital order, performed in [facility] hospital lab) Nasopharyngeal Nasopharyngeal Swab     Status: Abnormal   Collection Time: 11/05/19  3:07 PM   Specimen: Nasopharyngeal Swab  Result Value Ref Range Status   SARS Coronavirus 2 POSITIVE (A) NEGATIVE Final    Comment: RESULT CALLED TO, READ BACK BY AND VERIFIED WITH: RN H COOK AT 2025 11/05/2019 BY L BENFIELD (NOTE) If result is NEGATIVE SARS-CoV-2 target nucleic acids are NOT DETECTED. The SARS-CoV-2 RNA is generally detectable in upper and lower  respiratory specimens during the acute phase of infection. The lowest  concentration of SARS-CoV-2 viral copies this assay can detect is 250  copies / mL. A negative result does not preclude SARS-CoV-2 infection  and should not be used as the sole basis for treatment or other  patient management decisions.  A negative result may occur with  improper specimen collection / handling, submission of specimen other  than nasopharyngeal swab, presence of viral mutation(s) within the  areas targeted by this assay, and inadequate number of viral copies  (<250 copies / mL). A negative result must be combined with clinical   observations, patient history, and epidemiological information. If result is POSITIVE SARS-CoV-2 target nucleic acids are DETEC TED. The SARS-CoV-2 RNA is generally detectable in upper and lower  respiratory specimens during the acute phase of infection.  Positive  results are indicative of active infection with SARS-CoV-2.  Clinical  correlation with patient history and other diagnostic information is  necessary to determine patient infection status.  Positive results do  not rule out bacterial infection or co-infection with other viruses. If result is PRESUMPTIVE POSTIVE SARS-CoV-2 nucleic acids MAY BE PRESENT.   A presumptive positive result was obtained on the submitted specimen  and confirmed on repeat testing.  While 2019 novel coronavirus  (SARS-CoV-2) nucleic acids may be present in the submitted sample  additional confirmatory testing may be necessary for epidemiological  and / or clinical management purposes  to differentiate between  SARS-CoV-2 and other Sarbecovirus currently known to infect humans.  If clinically indicated additional testing with an alternate test  methodology (LAB7 453) is advised. The SARS-CoV-2 RNA is generally  detectable in upper and lower respiratory specimens during the acute  phase of infection. The expected result is Negative. Fact Sheet for Patients:  StrictlyIdeas.no Fact Sheet for Healthcare Providers: BankingDealers.co.za This test is not yet approved or cleared by the [place] FDA and has been authorized for detection and/or diagnosis of SARS-CoV-2 by FDA under an Emergency Use Authorization (EUA).  This EUA will remain in effect (meaning this test can be used) for the duration of the COVID-19 declaration under Section 564(b)(1) of the Act, 21 U.S.C. section 360bbb-3(b)(1), unless the authorization is terminated or revoked sooner. Performed at [facility], [place] [address]  [place], [place] [address]   Blood Culture (routine x 2)     Status: None (Preliminary result)   Collection Time: 11/05/19  3:45 PM   Specimen: BLOOD LEFT FOREARM  Result Value  Ref Range Status   Specimen Description   Final    BLOOD LEFT FOREARM Performed at [facility], [place] [address], [place], [place] [address]    Special Requests   Final    BOTTLES DRAWN AEROBIC ONLY Blood Culture results may not be optimal due to an inadequate volume of blood received in culture bottles Performed at [facility] [address] [place], [place] [address]    Culture   Final    NO GROWTH 3 DAYS Performed at [facility], [place] [address], [place], [place] [address]    Report Status PENDING  Incomplete     Medications:   . allopurinol  100 mg Oral QHS  . atorvastatin  10 mg Oral QHS  . enoxaparin (LOVENOX) injection  50 mg Subcutaneous Q24H  . guaiFENesin  600 mg Oral BID  . insulin aspart  0-20 Units Subcutaneous TID WC  . insulin aspart  0-5 Units Subcutaneous QHS  . insulin aspart  6 Units Subcutaneous TID WC  . insulin glargine  30 Units Subcutaneous BID  . methylPREDNISolone (SOLU-MEDROL) injection  40 mg Intravenous Q12H  . mometasone-formoterol  2 puff Inhalation BID  . vitamin C  500 mg Oral Daily  . zinc sulfate  220 mg Oral Daily   Continuous Infusions: . remdesivir 100 mg in NS 250 mL Stopped (11/08/19 2014)      LOS: 4 days   Charlynne Cousins  [facility]  11/09/2019, 7:49 AM

## 2019-11-10 LAB — CBC WITH DIFFERENTIAL/PLATELET
Abs Immature Granulocytes: 0.99 10*3/uL — ABNORMAL HIGH (ref 0.00–0.07)
Basophils Absolute: 0.1 10*3/uL (ref 0.0–0.1)
Basophils Relative: 1 %
Eosinophils Absolute: 0 10*3/uL (ref 0.0–0.5)
Eosinophils Relative: 0 %
HCT: 38 % (ref 36.0–46.0)
Hemoglobin: 12 g/dL (ref 12.0–15.0)
Immature Granulocytes: 8 %
Lymphocytes Relative: 14 %
Lymphs Abs: 1.8 10*3/uL (ref 0.7–4.0)
MCH: 28.5 pg (ref 26.0–34.0)
MCHC: 31.6 g/dL (ref 30.0–36.0)
MCV: 90.3 fL (ref 80.0–100.0)
Monocytes Absolute: 0.6 10*3/uL (ref 0.1–1.0)
Monocytes Relative: 4 %
Neutro Abs: 9 10*3/uL — ABNORMAL HIGH (ref 1.7–7.7)
Neutrophils Relative %: 73 %
Platelets: 368 10*3/uL (ref 150–400)
RBC: 4.21 MIL/uL (ref 3.87–5.11)
RDW: 13.3 % (ref 11.5–15.5)
WBC: 12.5 10*3/uL — ABNORMAL HIGH (ref 4.0–10.5)
nRBC: 0 % (ref 0.0–0.2)

## 2019-11-10 LAB — GLUCOSE, CAPILLARY
Glucose-Capillary: 158 mg/dL — ABNORMAL HIGH (ref 70–99)
Glucose-Capillary: 272 mg/dL — ABNORMAL HIGH (ref 70–99)
Glucose-Capillary: 366 mg/dL — ABNORMAL HIGH (ref 70–99)
Glucose-Capillary: 230 mg/dL — ABNORMAL HIGH (ref 70–99)

## 2019-11-10 LAB — COMPREHENSIVE METABOLIC PANEL
ALT: 29 U/L (ref 0–44)
AST: 21 U/L (ref 15–41)
Albumin: 3 g/dL — ABNORMAL LOW (ref 3.5–5.0)
Alkaline Phosphatase: 49 U/L (ref 38–126)
Anion gap: 11 (ref 5–15)
BUN: 23 mg/dL — ABNORMAL HIGH (ref 6–20)
CO2: 23 mmol/L (ref 22–32)
Calcium: 8.5 mg/dL — ABNORMAL LOW (ref 8.9–10.3)
Chloride: 105 mmol/L (ref 98–111)
Creatinine, Ser: 0.88 mg/dL (ref 0.44–1.00)
GFR calc Af Amer: >60 mL/min (ref >60)
GFR calc non Af Amer: >60 mL/min (ref >60)
Glucose, Bld: 150 mg/dL — ABNORMAL HIGH (ref 70–99)
Potassium: 4.3 mmol/L (ref 3.5–5.1)
Sodium: 139 mmol/L (ref 135–145)
Total Bilirubin: 0.4 mg/dL (ref 0.3–1.2)
Total Protein: 6.4 g/dL — ABNORMAL LOW (ref 6.5–8.1)

## 2019-11-10 LAB — D-DIMER, QUANTITATIVE: D-Dimer, Quant: 0.37 ug/mL-FEU (ref 0.00–0.50)

## 2019-11-10 LAB — CULTURE, BLOOD (ROUTINE X 2)
Culture: NO GROWTH
Culture: NO GROWTH
Special Requests: ADEQUATE

## 2019-11-10 LAB — BRAIN NATRIURETIC PEPTIDE: B Natriuretic Peptide: 21.3 pg/mL (ref 0.0–100.0)

## 2019-11-10 LAB — C-REACTIVE PROTEIN: CRP: 3.4 mg/dL — ABNORMAL HIGH (ref <1.0)

## 2019-11-10 LAB — MAGNESIUM: Magnesium: 2.3 mg/dL (ref 1.7–2.4)

## 2019-11-10 MED ORDER — LIVING WELL WITH DIABETES BOOK
Freq: Once | Status: AC
  Administered 2019-11-10: 16:00:00
  Filled 2019-11-10: qty 1

## 2019-11-10 NOTE — Progress Notes (Signed)
Physical Therapy Treatment Patient Details Name: Whitney Shelton MRN: [id number] DOB: 06/12/1966 Today's Date: 11/10/2019    History of Present Illness 53 y.o. female with history of asthma, DM-2 and nephrolithiasis presenting with shortness of breath, cough and fever. +COVID; CBGs >400, A1c 9.2    PT Comments    Pt on 4L/min via HFNC today, was able to ambulate approx 49ft x 2 with rest between. Pt ambulates with SBA and no AD. On initial distance noted 02 sats remained in low 80s throughout distance and increased after few mins once returned to room and resting. On latter attempt changed to new finger probe and sats began in 90s but dropped to min of 79% but was able to recover and keeping in 80s for remained of ambulation. Pt does not seem to be in any distress with ambulation and states that she feels fine but has noted she is breathing harder as to be expected with exercising. Pt c/o soreness and stiffness in BLE lateral aspect, completed some stretching in supine to alleviate this.    Follow Up Recommendations  No PT follow up     Equipment Recommendations  None recommended by PT    Recommendations for Other Services       Precautions / Restrictions Precautions Precautions: None Restrictions Weight Bearing Restrictions: No    Mobility  Bed Mobility               General bed mobility comments: sitting up in recliner  Transfers Overall transfer level: Modified independent Equipment used: None                Ambulation/Gait Ambulation/Gait assistance: Supervision Gait Distance (Feet): 400 Feet Assistive device: None Gait Pattern/deviations: Step-through pattern     General Gait Details: ambulated 434ft x 2 with rest between ambulation   Stairs             Wheelchair Mobility    Modified Rankin (Stroke Patients Only)       Balance Overall balance assessment: Modified Independent                                           Cognition Arousal/Alertness: Awake/alert Behavior During Therapy: Restless Overall Cognitive Status: Within Functional Limits for tasks assessed                                        Exercises      General Comments General comments (skin integrity, edema, etc.): c/o soreness and stiffnes in B lateral legs, completed passive stretching to BLE in supine      Pertinent Vitals/Pain Pain Assessment: Faces Faces Pain Scale: Hurts even more Pain Location: B lateral legs states feels soore from stiffness Pain Descriptors / Indicators: Sore    Home Living                      Prior Function            PT Goals (current goals can now be found in the care plan section) Acute Rehab PT Goals Patient Stated Goal: go home PT Goal Formulation: With patient Time For Goal Achievement: 11/21/19 Potential to Achieve Goals: Good Progress towards PT goals: Progressing toward goals    Frequency    Min 3X/week  PT Plan Current plan remains appropriate    Co-evaluation              AM-PAC PT "6 Clicks" Mobility   Outcome Measure  Help needed turning from your back to your side while in a flat bed without using bedrails?: None Help needed moving from lying on your back to sitting on the side of a flat bed without using bedrails?: None Help needed moving to and from a bed to a chair (including a wheelchair)?: None Help needed standing up from a chair using your arms (e.g., wheelchair or bedside chair)?: None Help needed to walk in hospital room?: A Little Help needed climbing 3-5 steps with a railing? : None 6 Click Score: 23    End of Session Equipment Utilized During Treatment: Oxygen Activity Tolerance: Patient tolerated treatment well Patient left: in chair;with call bell/phone within reach Nurse Communication: Mobility status PT Visit Diagnosis: Difficulty in walking, not elsewhere classified (R26.2)     Time: [id number] PT Time  Calculation (min) (ACUTE ONLY): 42 min  Charges:  $Gait Training: 23-37 mins $Therapeutic Exercise: 8-22 mins                     Horald Chestnut, PT    Delford Field 11/10/2019, 1:38 PM

## 2019-11-10 NOTE — Progress Notes (Signed)
Inpatient Diabetes Program Recommendations  AACE/ADA: New Consensus Statement on Inpatient Glycemic Control (2015)  Target Ranges:  Prepandial:   less than 140 mg/dL      Peak postprandial:   less than 180 mg/dL (1-2 hours)      Critically ill patients:  140 - 180 mg/dL   Lab Results  Component Value Date   GLUCAP 272 (H) 11/10/2019   HGBA1C 9.2 (H) 11/06/2019    Review of Glycemic Control Results for Whitney Shelton, Whitney Shelton (MRN [id number]) as of 11/10/2019 14:53  Ref. Range 11/09/2019 11:52 11/09/2019 16:25 11/09/2019 21:39 11/10/2019 08:01 11/10/2019 11:38  Glucose-Capillary Latest Ref Range: 70 - 99 mg/dL 288 (H) 252 (H) 220 (H) 158 (H) 272 (H)   Inpatient Diabetes Program Recommendations:    Postprandial CBGs elevated. Please consider increase in Novolog meal coverage to 8-10 units tid if eats 50%.  Thank you, Nani Gasser. Graelyn Bihl, RN, MSN, CDE  Diabetes Coordinator Inpatient Glycemic Control Team Team Pager [phone] (8am-5pm) 11/10/2019 2:54 PM

## 2019-11-10 NOTE — Progress Notes (Signed)
[facility] PROGRESS NOTE    Progress Note  Whitney Shelton  [id number] DOB: 1966/02/06 DOA: 11/05/2019 PCP: Glendon Axe, MD     Brief Narrative:   Whitney Shelton is an 53 y.o. female past medical history of diabetes mellitus type 2, nephrolithiasis comes in for shortness of breath, fever for about 1 week prior to admission.  She was diagnosed with COVID-19 infection over at [facility] clinic few days ago came to the ED was found to be hypoxic placed on 6 L, but the following day had to be placed on 12 to keep saturation above 92% history of PE keep arising.  Assessment/Plan:   Acute respiratory failure with hypoxia  Pneumonia due to COVID-19 virus She is currently acquiring 4 L of high flow nasal cannula to keep saturations greater 92%. She is status post 2 doses of Actemra, has completed course of IV remdesivir will continue IV steroids. Her inflammatory markers are improving nicely.  Mild leukocytosis likely due to steroids.  Obesity (BMI 30-39.9) Be as an outpatient.  Type 2 diabetes mellitus without complication ([place]): Blood glucoses continues to improve very nicely, continue long-acting insulin plus sliding scale. Her A1c was 9.2 on admission.   DVT prophylaxis: lovenox Family Communication:none Disposition Plan/Barrier to D/C: unable to determine Code Status:     Code Status Orders  (From admission, onward)         Start     Ordered   11/05/19 2330  Full code  Continuous     11/05/19 2329        Code Status History    Date Active Date Inactive Code Status Order ID Comments User Context   02/17/2018 1304 02/18/2018 1145 Full Code [id number]  Brien Few, MD Inpatient   08/11/2017 1330 08/11/2017 2144 Full Code [id number]  Franchot Gallo, MD Inpatient   Advance Care Planning Activity        IV Access:    Peripheral IV   Procedures and diagnostic studies:   No results found.   Medical Consultants:    None.  Anti-Infectives:    IV remdesivir  Subjective:    Whitney Shelton patient feels better than yesterday.  Breathing is significantly improved.   Objective:    Vitals:   11/10/19 0000 11/10/19 0200 11/10/19 0300 11/10/19 0526  BP:    (!) 142/83  Pulse: (!) 58 62 (!) 59 66  Resp:    18  Temp:    98.5 F (36.9 C)  TempSrc:      SpO2: 94% 93% 92% 90%  Weight:      Height:       SpO2: 90 % O2 Flow Rate (L/min): 4 L/min FiO2 (%): (!) 9 %  No intake or output data in the 24 hours ending 11/10/19 0746 Filed Weights   11/05/19 1421  Weight: 104.3 kg    Exam: General exam: In no acute distress. Respiratory system: Good air movement and diffuse crackles bilaterally. Cardiovascular system: S1 & S2 heard, RRR. No JVD. Gastrointestinal system: Abdomen is nondistended, soft and nontender.  Central nervous system: Alert and oriented. No focal neurological deficits. Extremities: No pedal edema. Skin: No rashes, lesions or ulcers Psychiatry: Judgement and insight appear normal. Mood & affect appropriate.    Data Reviewed:    Labs: Basic Metabolic Panel: Recent Labs  Lab 11/06/19 0255 11/07/19 0245 11/08/19 0500 11/09/19 0312 11/10/19 0122  NA 135 136 137 137 139  K 4.3 4.3 4.3 4.3 4.3  CL 99  102 102 104 105  CO2 21* 21* 22 20* 23  GLUCOSE 345* 255* 247* 158* 150*  BUN 20 27* 31* 24* 23*  CREATININE 0.91 0.89 0.84 0.79 0.88  CALCIUM 8.9 8.9 8.8* 8.7* 8.5*  MG  --  2.3 2.2 2.3 2.3   GFR Estimated Creatinine Clearance: 90.2 mL/min (by C-G formula based on SCr of 0.88 mg/dL). Liver Function Tests: Recent Labs  Lab 11/06/19 0255 11/07/19 0245 11/08/19 0500 11/09/19 0312 11/10/19 0122  AST 23 22 22 29 21   ALT 24 23 24 27 29   ALKPHOS 62 59 58 55 49  BILITOT 0.2* 0.3 0.4 0.4 0.4  PROT 7.5 7.2 7.1 6.6 6.4*  ALBUMIN 3.4* 3.1* 3.1* 3.0* 3.0*   No results for input(s): LIPASE, AMYLASE in the last 168 hours. No results for input(s): AMMONIA in the last 168 hours. Coagulation profile No  results for input(s): INR, PROTIME in the last 168 hours. COVID-19 Labs  Recent Labs    11/08/19 0500 11/09/19 0312 11/10/19 0122  DDIMER 0.37 0.31 0.37  CRP 8.7* 4.6* 3.4*    Lab Results  Component Value Date   SARSCOV2NAA POSITIVE (A) 11/05/2019    CBC: Recent Labs  Lab 11/06/19 0255 11/07/19 0245 11/08/19 0500 11/09/19 0312 11/10/19 0122  WBC 9.5 8.0 11.1* 12.7* 12.5*  NEUTROABS 8.2* 6.1 8.2* 9.6* 9.0*  HGB 12.6 12.4 12.6 12.3 12.0  HCT 38.7 38.9 39.8 38.6 38.0  MCV 89.4 90.7 90.5 90.8 90.3  PLT 227 268 353 369 368   Cardiac Enzymes: Recent Labs  Lab 11/05/19 1530  CKTOTAL 170  CKMB 2.8   BNP (last 3 results) No results for input(s): PROBNP in the last 8760 hours. CBG: Recent Labs  Lab 11/08/19 2131 11/09/19 0800 11/09/19 1152 11/09/19 1625 11/09/19 2139  GLUCAP 266* 160* 288* 252* 220*   D-Dimer: Recent Labs    11/09/19 0312 11/10/19 0122  DDIMER 0.31 0.37   Hgb A1c: No results for input(s): HGBA1C in the last 72 hours. Lipid Profile: No results for input(s): CHOL, HDL, LDLCALC, TRIG, CHOLHDL, LDLDIRECT in the last 72 hours. Thyroid function studies: No results for input(s): TSH, T4TOTAL, T3FREE, THYROIDAB in the last 72 hours.  Invalid input(s): FREET3 Anemia work up: No results for input(s): VITAMINB12, FOLATE, FERRITIN, TIBC, IRON, RETICCTPCT in the last 72 hours. Sepsis Labs: Recent Labs  Lab 11/05/19 1531  11/05/19 1716 11/06/19 0255 11/07/19 0245 11/08/19 0500 11/09/19 0312 11/10/19 0122  PROCALCITON  --    < >  --  0.16 <0.10 <0.10 <0.10  --   WBC  --   --   --  9.5 8.0 11.1* 12.7* 12.5*  LATICACIDVEN 2.0*  --  1.8  --   --   --   --   --    < > = values in this interval not displayed.   Microbiology Recent Results (from the past 240 hour(s))  Blood Culture (routine x 2)     Status: None (Preliminary result)   Collection Time: 11/05/19  3:01 PM   Specimen: BLOOD  Result Value Ref Range Status   Specimen Description    Final    BLOOD LEFT ANTECUBITAL Performed at [facility] [address] [place], [place] [address]    Special Requests   Final    BOTTLES DRAWN AEROBIC AND ANAEROBIC Blood Culture adequate volume Performed at [facility] [address], [place], [place] [address]    Culture   Final  NO GROWTH 4 DAYS Performed at [facility], [place] [address], [place], [place] [address]    Report Status PENDING  Incomplete  SARS Coronavirus 2 by RT PCR (hospital order, performed in [facility] hospital lab) Nasopharyngeal Nasopharyngeal Swab     Status: Abnormal   Collection Time: 11/05/19  3:07 PM   Specimen: Nasopharyngeal Swab  Result Value Ref Range Status   SARS Coronavirus 2 POSITIVE (A) NEGATIVE Final    Comment: RESULT CALLED TO, READ BACK BY AND VERIFIED WITH: RN H COOK AT 2025 11/05/2019 BY L BENFIELD (NOTE) If result is NEGATIVE SARS-CoV-2 target nucleic acids are NOT DETECTED. The SARS-CoV-2 RNA is generally detectable in upper and lower  respiratory specimens during the acute phase of infection. The lowest  concentration of SARS-CoV-2 viral copies this assay can detect is 250  copies / mL. A negative result does not preclude SARS-CoV-2 infection  and should not be used as the sole basis for treatment or other  patient management decisions.  A negative result may occur with  improper specimen collection / handling, submission of specimen other  than nasopharyngeal swab, presence of viral mutation(s) within the  areas targeted by this assay, and inadequate number of viral copies  (<250 copies / mL). A negative result must be combined with clinical  observations, patient history, and epidemiological information. If result is POSITIVE SARS-CoV-2 target nucleic acids are DETEC TED. The SARS-CoV-2 RNA is generally detectable in upper and lower  respiratory specimens during the acute phase of infection.  Positive  results are indicative of  active infection with SARS-CoV-2.  Clinical  correlation with patient history and other diagnostic information is  necessary to determine patient infection status.  Positive results do  not rule out bacterial infection or co-infection with other viruses. If result is PRESUMPTIVE POSTIVE SARS-CoV-2 nucleic acids MAY BE PRESENT.   A presumptive positive result was obtained on the submitted specimen  and confirmed on repeat testing.  While 2019 novel coronavirus  (SARS-CoV-2) nucleic acids may be present in the submitted sample  additional confirmatory testing may be necessary for epidemiological  and / or clinical management purposes  to differentiate between  SARS-CoV-2 and other Sarbecovirus currently known to infect humans.  If clinically indicated additional testing with an alternate test  methodology (LAB7 453) is advised. The SARS-CoV-2 RNA is generally  detectable in upper and lower respiratory specimens during the acute  phase of infection. The expected result is Negative. Fact Sheet for Patients:  StrictlyIdeas.no Fact Sheet for Healthcare Providers: BankingDealers.co.za This test is not yet approved or cleared by the [place] FDA and has been authorized for detection and/or diagnosis of SARS-CoV-2 by FDA under an Emergency Use Authorization (EUA).  This EUA will remain in effect (meaning this test can be used) for the duration of the COVID-19 declaration under Section 564(b)(1) of the Act, 21 U.S.C. section 360bbb-3(b)(1), unless the authorization is terminated or revoked sooner. Performed at [facility], [place] [address], [place], [place] [address]   Blood Culture (routine x 2)     Status: None (Preliminary result)   Collection Time: 11/05/19  3:45 PM   Specimen: BLOOD LEFT FOREARM  Result Value Ref Range Status   Specimen Description   Final    BLOOD LEFT FOREARM Performed at [facility], [place] [address] [place], [place] [address]    Special Requests   Final    BOTTLES DRAWN AEROBIC ONLY Blood Culture results may  not be optimal due to an inadequate volume of blood received in culture bottles Performed at [facility] [address], [place], [place] [address]    Culture   Final    NO GROWTH 4 DAYS Performed at [facility], [place] [address], [place], [place] [address]    Report Status PENDING  Incomplete     Medications:   . allopurinol  100 mg Oral QHS  . atorvastatin  10 mg Oral QHS  . enoxaparin (LOVENOX) injection  50 mg Subcutaneous Q24H  . guaiFENesin  600 mg Oral BID  . insulin aspart  0-20 Units Subcutaneous TID WC  . insulin aspart  0-5 Units Subcutaneous QHS  . insulin aspart  6 Units Subcutaneous TID WC  . insulin glargine  30 Units Subcutaneous BID  . methylPREDNISolone (SOLU-MEDROL) injection  40 mg Intravenous Q12H  . mometasone-formoterol  2 puff Inhalation BID  . vitamin C  500 mg Oral Daily  . zinc sulfate  220 mg Oral Daily   Continuous Infusions:     LOS: 5 days   Charlynne Cousins  [facility]  11/10/2019, 7:46 AM

## 2019-11-10 NOTE — Progress Notes (Addendum)
Updated patient's husband Herbie Baltimore on condition and plan of care. All questions answered. Patient is alert and oriented and has been on her personal cell phone throughout the day speaking with various family members.

## 2019-11-11 LAB — GLUCOSE, CAPILLARY
Glucose-Capillary: 157 mg/dL — ABNORMAL HIGH (ref 70–99)
Glucose-Capillary: 279 mg/dL — ABNORMAL HIGH (ref 70–99)
Glucose-Capillary: 223 mg/dL — ABNORMAL HIGH (ref 70–99)
Glucose-Capillary: 178 mg/dL — ABNORMAL HIGH (ref 70–99)

## 2019-11-11 MED ORDER — PREDNISONE 10 MG PO TABS: 5.0000 mg | ORAL_TABLET | Freq: Every day | ORAL | Status: DC

## 2019-11-11 MED ORDER — METHYLPREDNISOLONE SODIUM SUCC 40 MG IJ SOLR
20.0000 mg | Freq: Every day | INTRAMUSCULAR | Status: DC
  Administered 2019-11-11: 20 mg via INTRAVENOUS
  Filled 2019-11-11: qty 1

## 2019-11-11 NOTE — Progress Notes (Signed)
[facility] PROGRESS NOTE    Progress Note  Whitney Shelton  [id number] DOB: 09/07/1966 DOA: 11/05/2019 PCP: Glendon Axe, MD     Brief Narrative:   Whitney Shelton is an 53 y.o. female past medical history of diabetes mellitus type 2, nephrolithiasis comes in for shortness of breath, fever for about 1 week prior to admission.  She was diagnosed with COVID-19 infection over at [facility] clinic few days ago came to the ED was found to be hypoxic placed on 6 L, but the following day had to be placed on 12 to keep saturation above 92% history of PE keep arising.  Assessment/Plan:   Acute respiratory failure with hypoxia  Pneumonia due to COVID-19 virus She is satting greater than 92% on 2 L of oxygen. She relates her breathing continues to make strides as she feels there is a significant difference compared to 2 days ago. She status post 2 doses of Actemra, has completed course of IV remdesivir and IV steroids. Inflammatory markers have improved nicely. Upbeat today enjoying her breakfast.  Obesity (BMI 30-39.9) Be as an outpatient.  Type 2 diabetes mellitus without complication ([place]): Blood glucose continue to improve very nicely. Continue long-acting insulin plus sliding scale. We will coming down on steroids which will make her blood glucose easier to control we will continue to monitor closely.   DVT prophylaxis: lovenox Family Communication:none Disposition Plan/Barrier to D/C: unable to determine Code Status:     Code Status Orders  (From admission, onward)         Start     Ordered   11/05/19 2330  Full code  Continuous     11/05/19 2329        Code Status History    Date Active Date Inactive Code Status Order ID Comments User Context   02/17/2018 1304 02/18/2018 1145 Full Code [id number]  Brien Few, MD Inpatient   08/11/2017 1330 08/11/2017 2144 Full Code [id number]  Franchot Gallo, MD Inpatient   Advance Care Planning Activity        IV  Access:    Peripheral IV   Procedures and diagnostic studies:   No results found.   Medical Consultants:    None.  Anti-Infectives:   IV remdesivir  Subjective:    Whitney Shelton patient feels better than yesterday.  Breathing is significantly improved.   Objective:    Vitals:   11/10/19 1221 11/10/19 1706 11/10/19 2000 11/11/19 0507  BP:  (!) 142/85 138/72 136/77  Pulse:  76  61  Resp:  20 18   Temp:  98.4 F (36.9 C) 98.2 F (36.8 C) 98.5 F (36.9 C)  TempSrc:  Oral Oral Oral  SpO2: 90% 93% 92% 92%  Weight:      Height:       SpO2: 92 % O2 Flow Rate (L/min): 3 L/min FiO2 (%): (!) 9 %   Intake/Output Summary (Last 24 hours) at 11/11/2019 0810 Last data filed at 11/11/2019 0000 Gross per 24 hour  Intake 480 ml  Output -  Net 480 ml   Filed Weights   11/05/19 1421  Weight: 104.3 kg    Exam: General exam: In no acute distress. Respiratory system: Good air movement and diffuse crackles bilaterally. Cardiovascular system: S1 & S2 heard, RRR. No JVD. Gastrointestinal system: Abdomen is nondistended, soft and nontender.  Central nervous system: Alert and oriented. No focal neurological deficits. Extremities: No pedal edema. Skin: No rashes, lesions or ulcers Psychiatry: Judgement and  insight appear normal. Mood & affect appropriate.   Data Reviewed:    Labs: Basic Metabolic Panel: Recent Labs  Lab 11/06/19 0255 11/07/19 0245 11/08/19 0500 11/09/19 0312 11/10/19 0122  NA 135 136 137 137 139  K 4.3 4.3 4.3 4.3 4.3  CL 99 102 102 104 105  CO2 21* 21* 22 20* 23  GLUCOSE 345* 255* 247* 158* 150*  BUN 20 27* 31* 24* 23*  CREATININE 0.91 0.89 0.84 0.79 0.88  CALCIUM 8.9 8.9 8.8* 8.7* 8.5*  MG  --  2.3 2.2 2.3 2.3   GFR Estimated Creatinine Clearance: 90.2 mL/min (by C-G formula based on SCr of 0.88 mg/dL). Liver Function Tests: Recent Labs  Lab 11/06/19 0255 11/07/19 0245 11/08/19 0500 11/09/19 0312 11/10/19 0122  AST 23 22 22 29  21   ALT 24 23 24 27 29   ALKPHOS 62 59 58 55 49  BILITOT 0.2* 0.3 0.4 0.4 0.4  PROT 7.5 7.2 7.1 6.6 6.4*  ALBUMIN 3.4* 3.1* 3.1* 3.0* 3.0*   No results for input(s): LIPASE, AMYLASE in the last 168 hours. No results for input(s): AMMONIA in the last 168 hours. Coagulation profile No results for input(s): INR, PROTIME in the last 168 hours. COVID-19 Labs  Recent Labs    11/09/19 0312 11/10/19 0122  DDIMER 0.31 0.37  CRP 4.6* 3.4*    Lab Results  Component Value Date   SARSCOV2NAA POSITIVE (A) 11/05/2019    CBC: Recent Labs  Lab 11/06/19 0255 11/07/19 0245 11/08/19 0500 11/09/19 0312 11/10/19 0122  WBC 9.5 8.0 11.1* 12.7* 12.5*  NEUTROABS 8.2* 6.1 8.2* 9.6* 9.0*  HGB 12.6 12.4 12.6 12.3 12.0  HCT 38.7 38.9 39.8 38.6 38.0  MCV 89.4 90.7 90.5 90.8 90.3  PLT 227 268 353 369 368   Cardiac Enzymes: Recent Labs  Lab 11/05/19 1530  CKTOTAL 170  CKMB 2.8   BNP (last 3 results) No results for input(s): PROBNP in the last 8760 hours. CBG: Recent Labs  Lab 11/09/19 2139 11/10/19 0801 11/10/19 1138 11/10/19 1712 11/10/19 2113  GLUCAP 220* 158* 272* 366* 230*   D-Dimer: Recent Labs    11/09/19 0312 11/10/19 0122  DDIMER 0.31 0.37   Hgb A1c: No results for input(s): HGBA1C in the last 72 hours. Lipid Profile: No results for input(s): CHOL, HDL, LDLCALC, TRIG, CHOLHDL, LDLDIRECT in the last 72 hours. Thyroid function studies: No results for input(s): TSH, T4TOTAL, T3FREE, THYROIDAB in the last 72 hours.  Invalid input(s): FREET3 Anemia work up: No results for input(s): VITAMINB12, FOLATE, FERRITIN, TIBC, IRON, RETICCTPCT in the last 72 hours. Sepsis Labs: Recent Labs  Lab 11/05/19 1531  11/05/19 1716 11/06/19 0255 11/07/19 0245 11/08/19 0500 11/09/19 0312 11/10/19 0122  PROCALCITON  --    < >  --  0.16 <0.10 <0.10 <0.10  --   WBC  --   --   --  9.5 8.0 11.1* 12.7* 12.5*  LATICACIDVEN 2.0*  --  1.8  --   --   --   --   --    < > = values in this  interval not displayed.   Microbiology Recent Results (from the past 240 hour(s))  Blood Culture (routine x 2)     Status: None   Collection Time: 11/05/19  3:01 PM   Specimen: BLOOD  Result Value Ref Range Status   Specimen Description   Final    BLOOD LEFT ANTECUBITAL Performed at [facility] [address], [place], [place] [address]  Special Requests   Final    BOTTLES DRAWN AEROBIC AND ANAEROBIC Blood Culture adequate volume Performed at [facility] [address] [place], [place] [address]    Culture   Final    NO GROWTH 5 DAYS Performed at [facility], [place] [address], [place], [place] [address]    Report Status 11/10/2019 FINAL  Final  SARS Coronavirus 2 by RT PCR (hospital order, performed in [facility] hospital lab) Nasopharyngeal Nasopharyngeal Swab     Status: Abnormal   Collection Time: 11/05/19  3:07 PM   Specimen: Nasopharyngeal Swab  Result Value Ref Range Status   SARS Coronavirus 2 POSITIVE (A) NEGATIVE Final    Comment: RESULT CALLED TO, READ BACK BY AND VERIFIED WITH: RN H COOK AT 2025 11/05/2019 BY L BENFIELD (NOTE) If result is NEGATIVE SARS-CoV-2 target nucleic acids are NOT DETECTED. The SARS-CoV-2 RNA is generally detectable in upper and lower  respiratory specimens during the acute phase of infection. The lowest  concentration of SARS-CoV-2 viral copies this assay can detect is 250  copies / mL. A negative result does not preclude SARS-CoV-2 infection  and should not be used as the sole basis for treatment or other  patient management decisions.  A negative result may occur with  improper specimen collection / handling, submission of specimen other  than nasopharyngeal swab, presence of viral mutation(s) within the  areas targeted by this assay, and inadequate number of viral copies  (<250 copies / mL). A negative result must be combined with clinical  observations, patient history, and  epidemiological information. If result is POSITIVE SARS-CoV-2 target nucleic acids are DETEC TED. The SARS-CoV-2 RNA is generally detectable in upper and lower  respiratory specimens during the acute phase of infection.  Positive  results are indicative of active infection with SARS-CoV-2.  Clinical  correlation with patient history and other diagnostic information is  necessary to determine patient infection status.  Positive results do  not rule out bacterial infection or co-infection with other viruses. If result is PRESUMPTIVE POSTIVE SARS-CoV-2 nucleic acids MAY BE PRESENT.   A presumptive positive result was obtained on the submitted specimen  and confirmed on repeat testing.  While 2019 novel coronavirus  (SARS-CoV-2) nucleic acids may be present in the submitted sample  additional confirmatory testing may be necessary for epidemiological  and / or clinical management purposes  to differentiate between  SARS-CoV-2 and other Sarbecovirus currently known to infect humans.  If clinically indicated additional testing with an alternate test  methodology (LAB7 453) is advised. The SARS-CoV-2 RNA is generally  detectable in upper and lower respiratory specimens during the acute  phase of infection. The expected result is Negative. Fact Sheet for Patients:  StrictlyIdeas.no Fact Sheet for Healthcare Providers: BankingDealers.co.za This test is not yet approved or cleared by the [place] FDA and has been authorized for detection and/or diagnosis of SARS-CoV-2 by FDA under an Emergency Use Authorization (EUA).  This EUA will remain in effect (meaning this test can be used) for the duration of the COVID-19 declaration under Section 564(b)(1) of the Act, 21 U.S.C. section 360bbb-3(b)(1), unless the authorization is terminated or revoked sooner. Performed at [facility], [place] [address] [place], [place] [address]   Blood Culture  (routine x 2)     Status: None   Collection Time: 11/05/19  3:45 PM   Specimen: BLOOD LEFT FOREARM  Result Value Ref Range Status   Specimen Description  Final    BLOOD LEFT FOREARM Performed at [facility], [place] [address] [place], [place] [address]    Special Requests   Final    BOTTLES DRAWN AEROBIC ONLY Blood Culture results may not be optimal due to an inadequate volume of blood received in culture bottles Performed at [facility] [address] [place], [place] [address]    Culture   Final    NO GROWTH 5 DAYS Performed at [facility], [place] [address] [place], [place] [address]    Report Status 11/10/2019 FINAL  Final     Medications:   . allopurinol  100 mg Oral QHS  . atorvastatin  10 mg Oral QHS  . enoxaparin (LOVENOX) injection  50 mg Subcutaneous Q24H  . guaiFENesin  600 mg Oral BID  . insulin aspart  0-20 Units Subcutaneous TID WC  . insulin aspart  0-5 Units Subcutaneous QHS  . insulin aspart  6 Units Subcutaneous TID WC  . insulin glargine  30 Units Subcutaneous BID  . methylPREDNISolone (SOLU-MEDROL) injection  40 mg Intravenous Q12H  . mometasone-formoterol  2 puff Inhalation BID  . vitamin C  500 mg Oral Daily  . zinc sulfate  220 mg Oral Daily   Continuous Infusions:     LOS: 6 days   Charlynne Cousins  [facility]  11/11/2019, 8:10 AM

## 2019-11-11 NOTE — Progress Notes (Signed)
Spoke with patients husband and gave an update on her clinical status. Spouse asked about patients breathing pattern, current sats and general wellbeing. He is pleased with plan of care. Spouse is instructed to call if he has further questions or concerns/ he is agreeable

## 2019-11-11 NOTE — Progress Notes (Signed)
Spoke with patient's husband Herbie Baltimore regarding patient's condition and plan of care. All questions answered.

## 2019-11-12 LAB — CREATININE, SERUM
Creatinine, Ser: 1 mg/dL (ref 0.44–1.00)
GFR calc Af Amer: >60 mL/min (ref >60)
GFR calc non Af Amer: >60 mL/min (ref >60)

## 2019-11-12 LAB — GLUCOSE, CAPILLARY
Glucose-Capillary: 108 mg/dL — ABNORMAL HIGH (ref 70–99)
Glucose-Capillary: 66 mg/dL — ABNORMAL LOW (ref 70–99)
Glucose-Capillary: 148 mg/dL — ABNORMAL HIGH (ref 70–99)

## 2019-11-12 MED ORDER — DEXAMETHASONE 6 MG PO TABS: 6.0000 mg | ORAL_TABLET | Freq: Every day | ORAL | Status: DC

## 2019-11-12 MED ORDER — PREDNISONE 10 MG PO TABS: 10.0000 mg | ORAL_TABLET | Freq: Every day | ORAL | 0 refills | Status: AC

## 2019-11-12 MED ORDER — METFORMIN HCL ER 500 MG PO TB24: 1000.0000 mg | ORAL_TABLET | Freq: Every day | ORAL | 3 refills | Status: DC

## 2019-11-12 MED ORDER — PREDNISONE 10 MG PO TABS
10.0000 mg | ORAL_TABLET | Freq: Every day | ORAL | Status: DC
  Administered 2019-11-12: 10 mg via ORAL
  Filled 2019-11-12: qty 1

## 2019-11-12 NOTE — Progress Notes (Signed)
Discharge instructions reviewed with patient and her husband Herbie Baltimore. Patient educated on changes made to medication list, How to take prednisone taper, and instructions to quarantine for 2 weeks. Patient given thermometer to monitor temp at home. All questions answered.

## 2019-11-12 NOTE — Discharge Summary (Signed)
Physician Discharge Summary  Whitney Shelton [id number] DOB: [birth year] DOA: 11/05/2019  PCP: Glendon Axe, MD  Admit date: 11/05/2019 Discharge date: 11/12/2019  Admitted From: Home Disposition:  Home  Recommendations for Outpatient Follow-up:  1. Follow up with PCP in 1-2 weeks 2. Please obtain BMP/CBC in one week   Home Health:no Equipment/Devices:None  Discharge Condition:Stable CODE STATUS:Full Diet recommendation: Heart Healthy  Brief/Interim Summary: 53 y.o. female past medical history of diabetes mellitus type 2, nephrolithiasis comes in for shortness of breath, fever for about 1 week prior to admission.  She was diagnosed with COVID-19 infection over at [facility] clinic few days ago came to the ED was found to be hypoxic placed on 6 L, but the following day had to be placed on 12 to keep saturation above 92%.  Discharge Diagnoses:  Principal Problem:   Pneumonia due to COVID-19 virus Active Problems:   Respiratory failure with hypoxia (HCC)   Obesity (BMI 30-39.9)   Type 2 diabetes mellitus without complication ([place]) Acute respiratory failure with hypoxia likely due to COVID-19 pneumonia: She was started on supplemental oxygen 12 L to keep saturations greater 92%. She was started on IV remdesivir, steroids and vitamin zinc. She completed her course of IV remdesivir to 5 days. She will go home on steroid taper for 10 days. She is status post 2 dose of Actemra. Her inflammatory markers improved nicely. He did take her a while for her saturations to improve, but she kept very active in the hospital and ambulating and we were able to wean her to room air 4 hours prior to admission.  Obesity: Follow-up with PCP as an outpatient.  Diabetes mellitus type 2 without complications: She was started on long-acting insulin plus sliding scale her metformin was held. Her A1c was 9.2. We had a long conversation with the patient about blood glucose controlled at home  and dieting and exercise. Her metformin was increased to 1000 milligrams daily.   Discharge Instructions  Discharge Instructions    Diet - low sodium heart healthy   Complete by: As directed    Increase activity slowly   Complete by: As directed      Allergies as of 11/12/2019      Reactions   Vicodin [hydrocodone-acetaminophen] Nausea Only   Percocet [oxycodone-acetaminophen] Nausea Only      Medication List    TAKE these medications   albuterol 108 (90 Base) MCG/ACT inhaler Commonly known as: VENTOLIN HFA Inhale 1 puff into the lungs every 4 (four) hours as needed for wheezing or shortness of breath.   allopurinol 100 MG tablet Commonly known as: ZYLOPRIM Take 100 mg by mouth at bedtime.   atorvastatin 10 MG tablet Commonly known as: LIPITOR Take 1 tablet by mouth at bedtime.   magnesium gluconate 500 MG tablet Commonly known as: MAGONATE Take 500 mg by mouth daily.   metFORMIN 500 MG 24 hr tablet Commonly known as: GLUCOPHAGE-XR Take 2 tablets (1,000 mg total) by mouth daily. What changed: how much to take   omega-3 acid ethyl esters 1 g capsule Commonly known as: LOVAZA Take 1 g by mouth 4 (four) times daily.   predniSONE 10 MG tablet Commonly known as: DELTASONE Take 1 tablet (10 mg total) by mouth daily with breakfast for 3 days.       Allergies  Allergen Reactions  . Vicodin [Hydrocodone-Acetaminophen] Nausea Only  . Percocet [Oxycodone-Acetaminophen] Nausea Only    Consultations:  None   Procedures/Studies: Dg Chest Port 1  View  Result Date: 11/05/2019 CLINICAL DATA:  Dyspnea EXAM: PORTABLE CHEST 1 VIEW COMPARISON:  None. FINDINGS: Cardiomegaly. Bilateral, predominantly peripheral heterogeneous airspace opacity. The visualized skeletal structures are unremarkable. IMPRESSION: 1. Bilateral, predominantly peripheral heterogeneous airspace opacity, consistent with multifocal infection. 2.  Cardiomegaly. Electronically Signed   By: Eddie Candle  [name]   On: 11/05/2019 15:40    (Echo, Carotid, EGD, Colonoscopy, ERCP)    Subjective: He relates her breathing is significantly improved she has no new complaints this morning.  Discharge Exam: Vitals:   11/12/19 0400 11/12/19 0741  BP: 121/77 132/78  Pulse:  63  Resp:  18  Temp: 98.7 F (37.1 C) 97.8 F (36.6 C)  SpO2: 90% 91%   Vitals:   11/11/19 1655 11/11/19 2040 11/12/19 0400 11/12/19 0741  BP: (!) 145/85  121/77 132/78  Pulse: 68   63  Resp: 20 18  18   Temp: 99 F (37.2 C) 98 F (36.7 C) 98.7 F (37.1 C) 97.8 F (36.6 C)  TempSrc: Oral  Oral Oral  SpO2: 90%  90% 91%  Weight:      Height:        General: Pt is alert, awake, not in acute distress Cardiovascular: RRR, S1/S2 +, no rubs, no gallops Respiratory: CTA bilaterally, no wheezing, no rhonchi Abdominal: Soft, NT, ND, bowel sounds + Extremities: no edema, no cyanosis    The results of significant diagnostics from this hospitalization (including imaging, microbiology, ancillary and laboratory) are listed below for reference.     Microbiology: Recent Results (from the past 240 hour(s))  Blood Culture (routine x 2)     Status: None   Collection Time: 11/05/19  3:01 PM   Specimen: BLOOD  Result Value Ref Range Status   Specimen Description   Final    BLOOD LEFT ANTECUBITAL Performed at [facility] [address], [place], [place] [address]    Special Requests   Final    BOTTLES DRAWN AEROBIC AND ANAEROBIC Blood Culture adequate volume Performed at [place] [address] [place], [place] [address]    Culture   Final    NO GROWTH 5 DAYS Performed at [facility], [place] [address] [place], [place] [address]    Report Status 11/10/2019 FINAL  Final  SARS Coronavirus 2 by RT PCR (hospital order, performed in [facility] hospital lab) Nasopharyngeal Nasopharyngeal Swab     Status: Abnormal   Collection Time: 11/05/19  3:07 PM   Specimen:  Nasopharyngeal Swab  Result Value Ref Range Status   SARS Coronavirus 2 POSITIVE (A) NEGATIVE Final    Comment: RESULT CALLED TO, READ BACK BY AND VERIFIED WITH: RN H COOK AT 2025 11/05/2019 BY L BENFIELD (NOTE) If result is NEGATIVE SARS-CoV-2 target nucleic acids are NOT DETECTED. The SARS-CoV-2 RNA is generally detectable in upper and lower  respiratory specimens during the acute phase of infection. The lowest  concentration of SARS-CoV-2 viral copies this assay can detect is 250  copies / mL. A negative result does not preclude SARS-CoV-2 infection  and should not be used as the sole basis for treatment or other  patient management decisions.  A negative result may occur with  improper specimen collection / handling, submission of specimen other  than nasopharyngeal swab, presence of viral mutation(s) within the  areas targeted by this assay, and inadequate number of viral copies  (<250 copies / mL). A negative result must be combined with clinical  observations, patient  history, and epidemiological information. If result is POSITIVE SARS-CoV-2 target nucleic acids are DETEC TED. The SARS-CoV-2 RNA is generally detectable in upper and lower  respiratory specimens during the acute phase of infection.  Positive  results are indicative of active infection with SARS-CoV-2.  Clinical  correlation with patient history and other diagnostic information is  necessary to determine patient infection status.  Positive results do  not rule out bacterial infection or co-infection with other viruses. If result is PRESUMPTIVE POSTIVE SARS-CoV-2 nucleic acids MAY BE PRESENT.   A presumptive positive result was obtained on the submitted specimen  and confirmed on repeat testing.  While 2019 novel coronavirus  (SARS-CoV-2) nucleic acids may be present in the submitted sample  additional confirmatory testing may be necessary for epidemiological  and / or clinical management purposes  to  differentiate between  SARS-CoV-2 and other Sarbecovirus currently known to infect humans.  If clinically indicated additional testing with an alternate test  methodology (LAB7 453) is advised. The SARS-CoV-2 RNA is generally  detectable in upper and lower respiratory specimens during the acute  phase of infection. The expected result is Negative. Fact Sheet for Patients:  StrictlyIdeas.no Fact Sheet for Healthcare Providers: BankingDealers.co.za This test is not yet approved or cleared by the [place] FDA and has been authorized for detection and/or diagnosis of SARS-CoV-2 by FDA under an Emergency Use Authorization (EUA).  This EUA will remain in effect (meaning this test can be used) for the duration of the COVID-19 declaration under Section 564(b)(1) of the Act, 21 U.S.C. section 360bbb-3(b)(1), unless the authorization is terminated or revoked sooner. Performed at [facility], [place] [address], [place], [place] [address]   Blood Culture (routine x 2)     Status: None   Collection Time: 11/05/19  3:45 PM   Specimen: BLOOD LEFT FOREARM  Result Value Ref Range Status   Specimen Description   Final    BLOOD LEFT FOREARM Performed at [facility], [place] [address], [place], [place] [address]    Special Requests   Final    BOTTLES DRAWN AEROBIC ONLY Blood Culture results may not be optimal due to an inadequate volume of blood received in culture bottles Performed at [place] [address] [place], [place] [address]    Culture   Final    NO GROWTH 5 DAYS Performed at [facility], [place] [address] [place],  [address]    Report Status 11/10/2019 FINAL  Final     Labs: BNP (last 3 results) Recent Labs    11/08/19 0500 11/09/19 0312 11/10/19 0122  BNP 32.2 37.3 [id number]   Basic Metabolic Panel: Recent Labs  Lab 11/06/19 0255 11/07/19 0245 11/08/19 0500 11/09/19 0312  11/10/19 0122 11/12/19 0255  NA 135 136 137 137 139  --   K 4.3 4.3 4.3 4.3 4.3  --   CL 99 102 102 104 105  --   CO2 21* 21* 22 20* 23  --   GLUCOSE 345* 255* 247* 158* 150*  --   BUN 20 27* 31* 24* 23*  --   CREATININE 0.91 0.89 0.84 0.79 0.88 1.00  CALCIUM 8.9 8.9 8.8* 8.7* 8.5*  --   MG  --  2.3 2.2 2.3 2.3  --    Liver Function Tests: Recent Labs  Lab 11/06/19 0255 11/07/19 0245 11/08/19 0500 11/09/19 0312 11/10/19 0122  AST 23 22 22 29 21   ALT 24 23 24  27  29  ALKPHOS 62 59 58 55 49  BILITOT 0.2* 0.3 0.4 0.4 0.4  PROT 7.5 7.2 7.1 6.6 6.4*  ALBUMIN 3.4* 3.1* 3.1* 3.0* 3.0*   No results for input(s): LIPASE, AMYLASE in the last 168 hours. No results for input(s): AMMONIA in the last 168 hours. CBC: Recent Labs  Lab 11/06/19 0255 11/07/19 0245 11/08/19 0500 11/09/19 0312 11/10/19 0122  WBC 9.5 8.0 11.1* 12.7* 12.5*  NEUTROABS 8.2* 6.1 8.2* 9.6* 9.0*  HGB 12.6 12.4 12.6 12.3 12.0  HCT 38.7 38.9 39.8 38.6 38.0  MCV 89.4 90.7 90.5 90.8 90.3  PLT 227 268 353 369 368   Cardiac Enzymes: Recent Labs  Lab 11/05/19 1530  CKTOTAL 170  CKMB 2.8   BNP: Invalid input(s): POCBNP CBG: Recent Labs  Lab 11/11/19 0759 11/11/19 1131 11/11/19 1728 11/11/19 2031 11/12/19 0743  GLUCAP 157* 279* 223* 178* 66*   D-Dimer Recent Labs    11/10/19 0122  DDIMER 0.37   Hgb A1c No results for input(s): HGBA1C in the last 72 hours. Lipid Profile No results for input(s): CHOL, HDL, LDLCALC, TRIG, CHOLHDL, LDLDIRECT in the last 72 hours. Thyroid function studies No results for input(s): TSH, T4TOTAL, T3FREE, THYROIDAB in the last 72 hours.  Invalid input(s): FREET3 Anemia work up No results for input(s): VITAMINB12, FOLATE, FERRITIN, TIBC, IRON, RETICCTPCT in the last 72 hours. Urinalysis    Component Value Date/Time   COLORURINE STRAW (A) 07/28/2017 0500   APPEARANCEUR CLEAR 07/28/2017 0500   LABSPEC 1.013 07/28/2017 0500   PHURINE 5.0 07/28/2017 0500    GLUCOSEU 50 (A) 07/28/2017 0500   HGBUR SMALL (A) 07/28/2017 0500   BILIRUBINUR NEGATIVE 07/28/2017 0500   KETONESUR 5 (A) 07/28/2017 0500   PROTEINUR 30 (A) 07/28/2017 0500   UROBILINOGEN 0.2 09/21/2012 1135   NITRITE NEGATIVE 07/28/2017 0500   LEUKOCYTESUR NEGATIVE 07/28/2017 0500   Sepsis Labs Invalid input(s): PROCALCITONIN,  WBC,  LACTICIDVEN Microbiology Recent Results (from the past 240 hour(s))  Blood Culture (routine x 2)     Status: None   Collection Time: 11/05/19  3:01 PM   Specimen: BLOOD  Result Value Ref Range Status   Specimen Description   Final    BLOOD LEFT ANTECUBITAL Performed at [facility], [place] [address] [place], [place] [address]    Special Requests   Final    BOTTLES DRAWN AEROBIC AND ANAEROBIC Blood Culture adequate volume Performed at [facility] [address], [place], [place] [address]    Culture   Final    NO GROWTH 5 DAYS Performed at [facility], [place] [address] [place], [place] [address]    Report Status 11/10/2019 FINAL  Final  SARS Coronavirus 2 by RT PCR (hospital order, performed in [facility] hospital lab) Nasopharyngeal Nasopharyngeal Swab     Status: Abnormal   Collection Time: 11/05/19  3:07 PM   Specimen: Nasopharyngeal Swab  Result Value Ref Range Status   SARS Coronavirus 2 POSITIVE (A) NEGATIVE Final    Comment: RESULT CALLED TO, READ BACK BY AND VERIFIED WITH: RN H COOK AT 2025 11/05/2019 BY L BENFIELD (NOTE) If result is NEGATIVE SARS-CoV-2 target nucleic acids are NOT DETECTED. The SARS-CoV-2 RNA is generally detectable in upper and lower  respiratory specimens during the acute phase of infection. The lowest  concentration of SARS-CoV-2 viral copies this assay can detect is 250  copies / mL. A negative result does not preclude SARS-CoV-2 infection  and should not be  used as the sole basis for treatment or other  patient management decisions.  A negative result may occur  with  improper specimen collection / handling, submission of specimen other  than nasopharyngeal swab, presence of viral mutation(s) within the  areas targeted by this assay, and inadequate number of viral copies  (<250 copies / mL). A negative result must be combined with clinical  observations, patient history, and epidemiological information. If result is POSITIVE SARS-CoV-2 target nucleic acids are DETEC TED. The SARS-CoV-2 RNA is generally detectable in upper and lower  respiratory specimens during the acute phase of infection.  Positive  results are indicative of active infection with SARS-CoV-2.  Clinical  correlation with patient history and other diagnostic information is  necessary to determine patient infection status.  Positive results do  not rule out bacterial infection or co-infection with other viruses. If result is PRESUMPTIVE POSTIVE SARS-CoV-2 nucleic acids MAY BE PRESENT.   A presumptive positive result was obtained on the submitted specimen  and confirmed on repeat testing.  While 2019 novel coronavirus  (SARS-CoV-2) nucleic acids may be present in the submitted sample  additional confirmatory testing may be necessary for epidemiological  and / or clinical management purposes  to differentiate between  SARS-CoV-2 and other Sarbecovirus currently known to infect humans.  If clinically indicated additional testing with an alternate test  methodology (LAB7 453) is advised. The SARS-CoV-2 RNA is generally  detectable in upper and lower respiratory specimens during the acute  phase of infection. The expected result is Negative. Fact Sheet for Patients:  StrictlyIdeas.no Fact Sheet for Healthcare Providers: BankingDealers.co.za This test is not yet approved or cleared by the [place] FDA and has been authorized for detection and/or diagnosis of SARS-CoV-2 by FDA under an Emergency Use Authorization (EUA).  This EUA  will remain in effect (meaning this test can be used) for the duration of the COVID-19 declaration under Section 564(b)(1) of the Act, 21 U.S.C. section 360bbb-3(b)(1), unless the authorization is terminated or revoked sooner. Performed at [facility], [place] [address] [place], [place] [address]   Blood Culture (routine x 2)     Status: None   Collection Time: 11/05/19  3:45 PM   Specimen: BLOOD LEFT FOREARM  Result Value Ref Range Status   Specimen Description   Final    BLOOD LEFT FOREARM Performed at [facility], [place] [address], [place], [place] [address]    Special Requests   Final    BOTTLES DRAWN AEROBIC ONLY Blood Culture results may not be optimal due to an inadequate volume of blood received in culture bottles Performed at [place] [address] [place], [place] [address]    Culture   Final    NO GROWTH 5 DAYS Performed at [facility], [place] [address] [place], [place] [address]    Report Status 11/10/2019 FINAL  Final     Time coordinating discharge: Over 40 minutes  SIGNED:   Charlynne Cousins, MD  [facility] 11/12/2019, 8:13 AM Pager   If 7PM-7AM, please contact night-coverage [email or web address] Password [email or web address]

## 2019-11-12 NOTE — Progress Notes (Signed)
Spoke with patients husband Mikki Santee) and gave him an update on patients current inpatient status. All questions and concerns were answered appropriately.

## 2019-11-12 NOTE — Discharge Instructions (Signed)

## 2020-01-13 IMAGING — DX DG CHEST 1V PORT
1 series · 1 of 1 positions shown · non-contrast
Comparison: None.

CLINICAL DATA: Dyspnea

EXAM:
PORTABLE CHEST 1 VIEW

[chest ap]
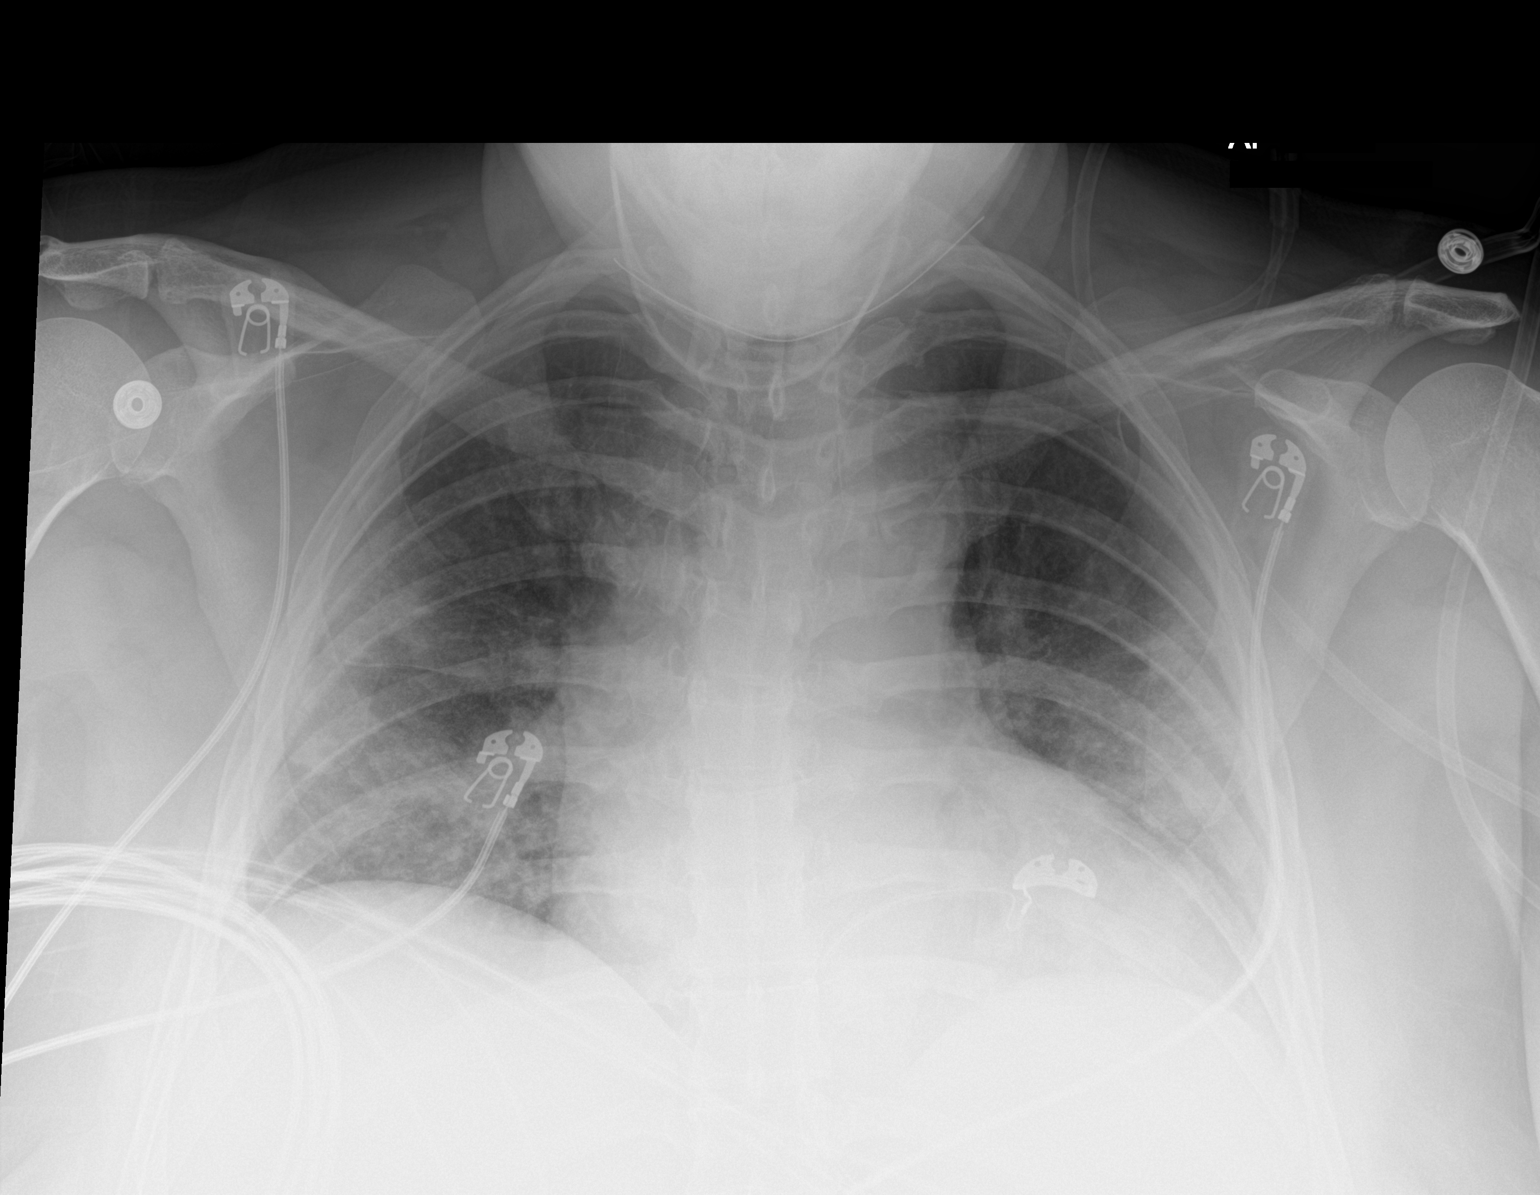

[1 of 1 positions shown; findings below may reference images not displayed]

FINDINGS: Cardiomegaly. Bilateral, predominantly peripheral heterogeneous
airspace opacity. The visualized skeletal structures are
unremarkable.
IMPRESSION: 1. Bilateral, predominantly peripheral heterogeneous airspace
opacity, consistent with multifocal infection.

2.  Cardiomegaly.

## 2020-08-21 ENCOUNTER — Ambulatory Visit (HOSPITAL_COMMUNITY)
Admission: RE | Admit: 2020-08-21 | Discharge: 2020-08-21 | Disposition: A | Discharge status: Home / Self Care | Payer: 59 | Source: Ambulatory Visit | Attending: Urology | Admitting: Urology

## 2020-08-21 ENCOUNTER — Inpatient Hospital Stay (HOSPITAL_COMMUNITY): Payer: 59

## 2020-08-21 ENCOUNTER — Other Ambulatory Visit: Payer: Self-pay | Admitting: Urology

## 2020-08-21 ENCOUNTER — Inpatient Hospital Stay (HOSPITAL_COMMUNITY): Payer: 59 | Admitting: Certified Registered Nurse Anesthetist

## 2020-08-21 ENCOUNTER — Other Ambulatory Visit: Payer: Self-pay

## 2020-08-21 ENCOUNTER — Encounter (HOSPITAL_COMMUNITY): Payer: Self-pay | Admitting: Urology

## 2020-08-21 ENCOUNTER — Encounter (HOSPITAL_COMMUNITY)
Admission: RE | Disposition: A | Discharge status: Home / Self Care | Payer: Self-pay | Source: Ambulatory Visit | Attending: Urology

## 2020-08-21 DIAGNOSIS — Z87442 Personal history of urinary calculi: Secondary | ICD-10-CM | POA: Insufficient documentation

## 2020-08-21 DIAGNOSIS — N132 Hydronephrosis with renal and ureteral calculous obstruction: Secondary | ICD-10-CM | POA: Diagnosis not present

## 2020-08-21 DIAGNOSIS — N281 Cyst of kidney, acquired: Secondary | ICD-10-CM | POA: Diagnosis not present

## 2020-08-21 DIAGNOSIS — E7800 Pure hypercholesterolemia, unspecified: Secondary | ICD-10-CM | POA: Diagnosis not present

## 2020-08-21 DIAGNOSIS — Z7982 Long term (current) use of aspirin: Secondary | ICD-10-CM | POA: Insufficient documentation

## 2020-08-21 DIAGNOSIS — Z90710 Acquired absence of both cervix and uterus: Secondary | ICD-10-CM | POA: Diagnosis not present

## 2020-08-21 DIAGNOSIS — J45909 Unspecified asthma, uncomplicated: Secondary | ICD-10-CM | POA: Insufficient documentation

## 2020-08-21 DIAGNOSIS — N393 Stress incontinence (female) (male): Secondary | ICD-10-CM | POA: Insufficient documentation

## 2020-08-21 DIAGNOSIS — M1990 Unspecified osteoarthritis, unspecified site: Secondary | ICD-10-CM | POA: Insufficient documentation

## 2020-08-21 DIAGNOSIS — Z79899 Other long term (current) drug therapy: Secondary | ICD-10-CM | POA: Insufficient documentation

## 2020-08-21 DIAGNOSIS — Z20822 Contact with and (suspected) exposure to covid-19: Secondary | ICD-10-CM | POA: Diagnosis not present

## 2020-08-21 DIAGNOSIS — Z6836 Body mass index (BMI) 36.0-36.9, adult: Secondary | ICD-10-CM | POA: Diagnosis not present

## 2020-08-21 DIAGNOSIS — E119 Type 2 diabetes mellitus without complications: Secondary | ICD-10-CM | POA: Insufficient documentation

## 2020-08-21 DIAGNOSIS — Z7984 Long term (current) use of oral hypoglycemic drugs: Secondary | ICD-10-CM | POA: Insufficient documentation

## 2020-08-21 DIAGNOSIS — Z8042 Family history of malignant neoplasm of prostate: Secondary | ICD-10-CM | POA: Diagnosis not present

## 2020-08-21 DIAGNOSIS — Z885 Allergy status to narcotic agent status: Secondary | ICD-10-CM | POA: Diagnosis not present

## 2020-08-21 DIAGNOSIS — E669 Obesity, unspecified: Secondary | ICD-10-CM | POA: Diagnosis not present

## 2020-08-21 DIAGNOSIS — Z841 Family history of disorders of kidney and ureter: Secondary | ICD-10-CM | POA: Insufficient documentation

## 2020-08-21 HISTORY — PX: CYSTOSCOPY W/ URETERAL STENT PLACEMENT: SHX1429

## 2020-08-21 LAB — BASIC METABOLIC PANEL
Anion gap: 16 — ABNORMAL HIGH (ref 5–15)
BUN: 20 mg/dL (ref 6–20)
CO2: 20 mmol/L — ABNORMAL LOW (ref 22–32)
Calcium: 9.6 mg/dL (ref 8.9–10.3)
Chloride: 100 mmol/L (ref 98–111)
Creatinine, Ser: 0.94 mg/dL (ref 0.44–1.00)
GFR calc Af Amer: >60 mL/min (ref >60)
GFR calc non Af Amer: >60 mL/min (ref >60)
Glucose, Bld: 224 mg/dL — ABNORMAL HIGH (ref 70–99)
Potassium: 4.5 mmol/L (ref 3.5–5.1)
Sodium: 136 mmol/L (ref 135–145)

## 2020-08-21 LAB — GLUCOSE, CAPILLARY
Glucose-Capillary: 215 mg/dL — ABNORMAL HIGH (ref 70–99)
Glucose-Capillary: 143 mg/dL — ABNORMAL HIGH (ref 70–99)

## 2020-08-21 LAB — SARS CORONAVIRUS 2 BY RT PCR (HOSPITAL ORDER, PERFORMED IN ~~LOC~~ HOSPITAL LAB): SARS Coronavirus 2: NEGATIVE

## 2020-08-21 SURGERY — CYSTOSCOPY, WITH RETROGRADE PYELOGRAM AND URETERAL STENT INSERTION
Anesthesia: General | Laterality: Left

## 2020-08-21 MED ORDER — IOHEXOL 300 MG/ML  SOLN
INTRAMUSCULAR | Status: DC | PRN
  Administered 2020-08-21: 6 mL

## 2020-08-21 MED ORDER — ONDANSETRON HCL 4 MG/2ML IJ SOLN: 4.0000 mg | Freq: Once | INTRAMUSCULAR | Status: DC | PRN

## 2020-08-21 MED ORDER — DEXMEDETOMIDINE (PRECEDEX) IN NS 20 MCG/5ML (4 MCG/ML) IV SYRINGE
PREFILLED_SYRINGE | INTRAVENOUS | Status: AC
  Filled 2020-08-21: qty 5

## 2020-08-21 MED ORDER — LACTATED RINGERS IV SOLN: INTRAVENOUS | Status: DC

## 2020-08-21 MED ORDER — FENTANYL CITRATE (PF) 100 MCG/2ML IJ SOLN
INTRAMUSCULAR | Status: AC
  Filled 2020-08-21: qty 2

## 2020-08-21 MED ORDER — CEFAZOLIN SODIUM-DEXTROSE 2-4 GM/100ML-% IV SOLN
INTRAVENOUS | Status: AC
  Filled 2020-08-21: qty 100

## 2020-08-21 MED ORDER — CEFAZOLIN SODIUM-DEXTROSE 2-4 GM/100ML-% IV SOLN
2.0000 g | Freq: Once | INTRAVENOUS | Status: AC
  Administered 2020-08-21: 2 g via INTRAVENOUS

## 2020-08-21 MED ORDER — PROPOFOL 10 MG/ML IV BOLUS
INTRAVENOUS | Status: DC | PRN
  Administered 2020-08-21: 200 mg via INTRAVENOUS

## 2020-08-21 MED ORDER — LIDOCAINE 2% (20 MG/ML) 5 ML SYRINGE
INTRAMUSCULAR | Status: DC | PRN
  Administered 2020-08-21: 100 mg via INTRAVENOUS

## 2020-08-21 MED ORDER — DEXAMETHASONE SODIUM PHOSPHATE 10 MG/ML IJ SOLN
INTRAMUSCULAR | Status: DC | PRN
  Administered 2020-08-21: 4 mg via INTRAVENOUS

## 2020-08-21 MED ORDER — HYDROCODONE-ACETAMINOPHEN 7.5-325 MG PO TABS: 1.0000 | ORAL_TABLET | Freq: Once | ORAL | Status: DC | PRN

## 2020-08-21 MED ORDER — DEXAMETHASONE SODIUM PHOSPHATE 10 MG/ML IJ SOLN
INTRAMUSCULAR | Status: AC
  Filled 2020-08-21: qty 1

## 2020-08-21 MED ORDER — PROPOFOL 10 MG/ML IV BOLUS
INTRAVENOUS | Status: AC
  Filled 2020-08-21: qty 20

## 2020-08-21 MED ORDER — CHLORHEXIDINE GLUCONATE 0.12 % MT SOLN
15.0000 mL | Freq: Once | OROMUCOSAL | Status: AC
  Administered 2020-08-21: 15 mL via OROMUCOSAL

## 2020-08-21 MED ORDER — SCOPOLAMINE 1 MG/3DAYS TD PT72
1.0000 | MEDICATED_PATCH | TRANSDERMAL | Status: DC
  Administered 2020-08-21: 1.5 mg via TRANSDERMAL
  Filled 2020-08-21: qty 1

## 2020-08-21 MED ORDER — LIDOCAINE 2% (20 MG/ML) 5 ML SYRINGE
INTRAMUSCULAR | Status: AC
  Filled 2020-08-21: qty 5

## 2020-08-21 MED ORDER — AMISULPRIDE (ANTIEMETIC) 5 MG/2ML IV SOLN: 10.0000 mg | Freq: Once | INTRAVENOUS | Status: DC | PRN

## 2020-08-21 MED ORDER — SODIUM CHLORIDE 0.9 % IR SOLN
Status: DC | PRN
  Administered 2020-08-21: 3000 mL

## 2020-08-21 MED ORDER — PROPOFOL 500 MG/50ML IV EMUL
INTRAVENOUS | Status: DC | PRN
  Administered 2020-08-21: 150 ug/kg/min via INTRAVENOUS

## 2020-08-21 MED ORDER — HYDROMORPHONE HCL 1 MG/ML IJ SOLN: 0.2500 mg | INTRAMUSCULAR | Status: DC | PRN

## 2020-08-21 MED ORDER — DEXMEDETOMIDINE (PRECEDEX) IN NS 20 MCG/5ML (4 MCG/ML) IV SYRINGE
PREFILLED_SYRINGE | INTRAVENOUS | Status: DC | PRN
  Administered 2020-08-21: 4 ug via INTRAVENOUS
  Administered 2020-08-21: 8 ug via INTRAVENOUS

## 2020-08-21 MED ORDER — ONDANSETRON HCL 4 MG/2ML IJ SOLN
INTRAMUSCULAR | Status: AC
  Filled 2020-08-21: qty 2

## 2020-08-21 MED ORDER — ONDANSETRON HCL 4 MG/2ML IJ SOLN
INTRAMUSCULAR | Status: DC | PRN
  Administered 2020-08-21: 4 mg via INTRAVENOUS

## 2020-08-21 MED ORDER — PROPOFOL 1000 MG/100ML IV EMUL
INTRAVENOUS | Status: AC
  Filled 2020-08-21: qty 100

## 2020-08-21 MED ORDER — MIDAZOLAM HCL 2 MG/2ML IJ SOLN
INTRAMUSCULAR | Status: AC
  Filled 2020-08-21: qty 2

## 2020-08-21 MED ORDER — MEPERIDINE HCL 50 MG/ML IJ SOLN: 6.2500 mg | INTRAMUSCULAR | Status: DC | PRN

## 2020-08-21 MED ORDER — FENTANYL CITRATE (PF) 100 MCG/2ML IJ SOLN
INTRAMUSCULAR | Status: DC | PRN
Start: 2020-08-21 — End: 2020-08-21
  Administered 2020-08-21 (×2): 50 ug via INTRAVENOUS

## 2020-08-21 MED ORDER — KETOROLAC TROMETHAMINE 30 MG/ML IJ SOLN: 30.0000 mg | Freq: Once | INTRAMUSCULAR | Status: DC | PRN

## 2020-08-21 SURGICAL SUPPLY — 12 items
BAG URO CATCHER STRL LF (MISCELLANEOUS) ×3 IMPLANT
CATH INTERMIT  6FR 70CM (CATHETERS) ×3 IMPLANT
CLOTH BEACON ORANGE TIMEOUT ST (SAFETY) ×3 IMPLANT
GLOVE BIOGEL M STRL SZ7.5 (GLOVE) ×3 IMPLANT
GOWN STRL REUS W/TWL XL LVL3 (GOWN DISPOSABLE) ×6 IMPLANT
GUIDEWIRE STR DUAL SENSOR (WIRE) ×3 IMPLANT
KIT TURNOVER KIT A (KITS) IMPLANT
MANIFOLD NEPTUNE II (INSTRUMENTS) ×3 IMPLANT
PACK CYSTO (CUSTOM PROCEDURE TRAY) ×3 IMPLANT
STENT URET 6FRX26 CONTOUR (STENTS) ×3 IMPLANT
TUBING CONNECTING 10 (TUBING) IMPLANT
TUBING CONNECTING 10' (TUBING)

## 2020-08-21 NOTE — Discharge Instructions (Signed)
I have reviewed discharge instructions in detail with the patient. They will follow-up with me or their physician as scheduled. My nurse will also be calling the patients as per protocol.   

## 2020-08-21 NOTE — Anesthesia Postprocedure Evaluation (Signed)
Anesthesia Post Note  Patient: Whitney Shelton  Procedure(s) Performed: CYSTOSCOPY WITH RETROGRADE PYELOGRAM/URETERAL STENT PLACEMENT (Left )     Patient location during evaluation: PACU Anesthesia Type: General Level of consciousness: awake and alert Pain management: pain level controlled Vital Signs Assessment: post-procedure vital signs reviewed and stable Respiratory status: spontaneous breathing, nonlabored ventilation, respiratory function stable and patient connected to nasal cannula oxygen Cardiovascular status: blood pressure returned to baseline and stable Postop Assessment: no apparent nausea or vomiting Anesthetic complications: no   No complications documented.  Last Vitals:  Vitals:   08/21/20 1615 08/21/20 1630  BP: 137/85 (!) 141/93  Pulse: 89 75  Resp: 19 20  Temp: 36.6 C 36.4 C  SpO2: 94% 94%    Last Pain:  Vitals:   08/21/20 1630  TempSrc:   PainSc: 0-No pain                 Barnet Glasgow

## 2020-08-21 NOTE — H&P (Signed)
1 - Recurrent Nephrolithiasis -  2013 - Lt 2 stage ureteroscopy for approx 1.6cm UPJ stone  2018 - Lt ureteroscoy for 100mm UPJ stone   Recent Surveilance:  08/2018 - [facility] 27mm stone (?parenchymal) and stable Rt rentl cyst.  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    2- Metabolic Stone Disease -   Metabolic Eval 66/0630: Composittion 80% Uric Acid + 20% CaOx. BMP, PTH, Urate - normal. 24 hr urine -high urate extrection 0.92   2018 - Composition 90% Urate / 10% CaOx ==> BID crystal lite for alkalinization + 200mg  allopurinol   3 - Stress Urinary Incontinence - s/p sling at time of hyst by Maxey Ransom 01/2018.   4 - Righl Renal Cyst - RLP by CT 2013 and stable 2018 (non-con) 3.6cm. Dedicated contrast imaging through her PCP 2019 without enhancement confirming simple cyst.   PMH sig for DM2 /obesity.   Today Whitney Shelton presents in acute pain. She states that she woke up at about 4:30 AM with acute onset left flank pain. She states that her pain radiates to her abdomen. Pain is currently severe. She has associated nausea. She states that this pain feelsl similar to past stone events. No complaints of gross hematuria. She denies dysuria or significant exacerbation in voiding symptoms. No fevers or chills. She denies issues with general anesthesia in the past and has had multiple procedures in the past for stone events.     ALLERGIES: Hydrocodone - Nausea Percocet TABS - cold sweats Vicodin - Dizziness    MEDICATIONS: Metformin Hcl 500 mg tablet  Aspir 81  Atorvastatin Calcium 10 mg tablet  Magnesium  Olmesartan Medoxomil  Omega 3     GU PSH: Complex cystometrogram, w/ void pressure and urethral pressure profile studies, any technique - 2018 Complex Uroflow - 2018 Cysto Uretero Lithotripsy - 2013 Cystoscopy Insert Stent, Left - 2018, 2013, 2013, 2013 Emg surf Electrd - 2018 Inject For cystogram - 2018 Intrabd voidng Press - 2018 Sling -  2019 Ureteroscopic laser litho, Left - 2018 Ureteroscopic stone removal - 2013       [place] Notes: Cystoscopy With Ureteroscopy With Removal Of Calculus, Cystoscopy With Insertion Of Ureteral Stent Left, Cystoscopy With Ureteroscopy With Lithotripsy, Cystoscopy With Insertion Of Ureteral Stent Left, Cystoscopy With Insertion Of Ureteral Stent Left, Tubal Ligation, Uterine Fibroid Embolization   NON-GU PSH: Laser Surgery Eye Tubal Ligation - 2013           GU PMH: Renal cyst (Stable, Chronic), Right - 12/27/107 Right uncertain neoplasm of kidney (Stable, Chronic), Right - 2019 Incomplete bladder emptying - 2018 Nocturia - 2018 Stress Incontinence (Worsening, Chronic) - 2018 Oth GU systems Signs/Symptoms, Bladder pain - 2014 Renal calculus, Nephrolithiasis - 2014 Ureteral calculus, Calculus of ureter - 2014     NON-GU PMH: Asthma, Asthma - 2014 Arthritis Diabetes Type 2 Encounter for general adult medical examination without abnormal findings, Encounter for preventive health examination Hypercholesterolemia     FAMILY HISTORY: 2 daughters - Daughter 1 son - Son nephrolithiasis - Father Prostate Cancer - Grandfather    SOCIAL HISTORY: Marital Status: Married Preferred Language: English; Ethnicity: Not Hispanic Or Latino; Race: White Current Smoking Status: Patient has never smoked.  <DIV'  Tobacco Use Assessment Completed:  Used Tobacco in last 30 days?   Has never drank.  Drinks 1 caffeinated drink per day. 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     REVIEW OF SYSTEMS:  GU Review Female:  Patient denies frequent urination, hard to postpone urination, burning /pain with urination, get up at night to urinate, leakage of urine, stream starts and stops, trouble starting your stream, have to strain to urinate, and being pregnant.     Gastrointestinal (Upper):  Patient reports nausea. Patient denies vomiting and indigestion/ heartburn.     Gastrointestinal  (Lower):  Patient denies diarrhea and constipation.     Constitutional:  Patient denies fever, night sweats, weight loss, and fatigue.     Skin:  Patient denies skin rash/ lesion and itching.     Eyes:  Patient denies blurred vision and double vision.     Ears/ Nose/ Throat:  Patient denies sore throat and sinus problems.     Hematologic/Lymphatic:  Patient denies swollen glands and easy bruising.     Cardiovascular:  Patient denies leg swelling and chest pains.     Respiratory:  Patient denies cough and shortness of breath.     Endocrine:  Patient denies excessive thirst.     Musculoskeletal:  Patient reports back pain. Patient denies joint pain.     Neurological:  Patient denies headaches and dizziness.     Psychologic:  Patient denies depression and anxiety.     VITAL SIGNS:        08/21/2020 08:19 AM      Weight 200 lb / 90.72 kg      Height 67 in / 170.18 cm      BP 191/103 mmHg      Heart Rate 70 /min      Temperature 97.5 F / 36.3 C      BMI 31.3 kg/m      MULTI-SYSTEM PHYSICAL EXAMINATION:       Constitutional: Well-nourished. No physical deformities. Normally developed. Good grooming. In acute pain. Moaning and pacing the room.       Respiratory: No labored breathing, no use of accessory muscles.       Cardiovascular: Normal temperature, normal extremity pulses, no swelling, no varicosities.       Neurologic / Psychiatric: Oriented to time, oriented to place, oriented to person. No depression, no anxiety, no agitation.      Gastrointestinal: No mass, no tenderness, no rigidity, non obese abdomen. Left CVAT.       Musculoskeletal: Normal gait and station of head and neck.              Complexity of Data:   Records Review:  Previous Patient Records  Urine Test Review:  Urinalysis  X-Ray Review: C.T. Abdomen/Pelvis: Reviewed Films. Reviewed Report.     PROCEDURES:    C.T. Urogram - [id number]      Patient confirmed No Neulasta OnPro Device.    Urinalysis  w/Scope  Dipstick Dipstick Cont'd Micro  Color: Yellow Bilirubin: Neg mg/dL WBC/hpf: 0 - 5/hpf  Appearance: Slightly Cloudy Ketones: Trace mg/dL RBC/hpf: 10 - 20/hpf  Specific Gravity: 1.025 Blood: 1+ ery/uL Bacteria: Mod (26-50/hpf)  pH: 5.5 Protein: 1+ mg/dL Cystals: NS (Not Seen)  Glucose: 2+ mg/dL Urobilinogen: 0.2 mg/dL Casts: NS (Not Seen)   Nitrites: Neg Trichomonas: Not Present   Leukocyte Esterase: Neg leu/uL Mucous: Not Present    Epithelial Cells: 0 - 5/hpf    Yeast: NS (Not Seen)    Sperm: Not Present    Ketoralac 60mg  - [id number], G2952  Qty: 60 Adm. By: Christella Scheuermann  Unit: mg Lot No [id number]  Route: IM Exp. Date 08/21/2021  Freq: None Mfgr.:   Site: Left  Buttock  Phenergan 25mg  - [id number], J2550  The injection site was sterilely prepped with alcohol. Phenergan was injected (IM) using standard technique. The patient tolerated the procedure well. A band aid was applied. The site was dry when the patient left the exam room.      Qty: 25 Adm. By: Leonette Nutting  Unit: mg Lot No [id number]  Route: IM Exp. Date 08/21/2021  Freq: None Mfgr.:   Site: Right Buttock   ASSESSMENT:     ICD-10 Details  1 GU:  Renal calculus - N20.0   2  Flank Pain - R10.84   3  Microscopic hematuria - R31.21    PLAN:   Orders  Labs Urine Culture  X-Rays: C.T. Stone Protocol Without Contrast  Schedule  Procedure: 08/21/2020 at [facility] [place] 60mg  (Toradol Per 15 Mg) - [id number], [phone]  Procedure: 08/21/2020 at [facility] - [phone] - Phenergan 25mg  (Phenergan Per 50 Mg) - N4076, [id number]  Document  Letter(s):  Created for Patient: Clinical Summary   Notes:  Precautionary culture sent today. CT imaging revealed an approximately 7 x 9 mm left UPJ calculus with associated hydronephrosis. She was given injections of Ketoralac and Phenergan today in clinic with noted improvement in acute pain and nausea. She does have a  difficult time tolerating oral pain medication and is concerned about being able to manage her pain, as this has been historically an issue for her with prior stone events. We reviewed options today in detail including temporizing her situation with placement of ureteral stent until definitive treatment can be scheduled. She would like to proceed with this. We reviewed the procedure and potential risks today in detail. She is aware and would like to proceed.. Case reviewed with on-call urologist who is in agreement. Will plan to schedule for cystoscopy, left retrograde pyelogram, and left ureteral stent placement later this afternoon. She will remain NPO from this point forward.   After a thorough review of the management options for the patient's condition the patient  elected to proceed with surgical therapy as noted above. We have discussed the potential benefits and risks of the procedure, side effects of the proposed treatment, the likelihood of the patient achieving the goals of the procedure, and any potential problems that might occur during the procedure or recuperation. Informed consent has been obtained.        Routing History

## 2020-08-21 NOTE — Op Note (Signed)
Preoperative diagnosis: Left ureteral stone Postoperative diagnosis: Left ureteral stone Surgery: Cystoscopy left retrograde ureterogram and insertion of a left ureteral stent Surgeon: Dr. Nicki Reaper Kaina Orengo  The patient has the above diagnosis and consented the above procedure.  Preoperative antibiotics given.  Extra care was taken with leg positioning.  Initially patient had cystoscopy.  Bladder mucosa and trigone were normal.  No cystitis.  No foreign body in bladder  Under fluoroscopic and cystoscopic guidance I passed a sensor wire to the mid left ureter followed by an open-ended ureteral catheter removing the wire.  I did a gentle retrograde with 4 cc of contrast and she had moderate hydronephrosis.  I then passed a wire curling in the upper pole calyx and remove the open-ended ureteral cath  Retrograde.  Retrograde was done with 4 cc of contrast.  The findings I just dictated  I then passed a well-prepared 26 cm x 6 double-J stent without a string curling in the upper pole calyx and in the bladder.  X-rays were taken.  There was some debris from left ureter expelled during the case.  Overall procedure went very well and patient was taken to recovery

## 2020-08-21 NOTE — Transfer of Care (Signed)
Immediate Anesthesia Transfer of Care Note  Patient: Whitney Shelton  Procedure(s) Performed: CYSTOSCOPY WITH RETROGRADE PYELOGRAM/URETERAL STENT PLACEMENT (Left )  Patient Location: PACU  Anesthesia Type:General  Level of Consciousness: awake, alert  and oriented  Airway & Oxygen Therapy: Patient Spontanous Breathing and Patient connected to face mask oxygen  Post-op Assessment: Report given to RN, Post -op Vital signs reviewed and stable and Patient moving all extremities X 4  Post vital signs: Reviewed and stable  Last Vitals:  Vitals Value Taken Time  BP    Temp    Pulse 76 08/21/20 1552  Resp 13 08/21/20 1552  SpO2 98 % 08/21/20 1552  Vitals shown include unvalidated device data.  Last Pain:  Vitals:   08/21/20 1501  TempSrc: Oral  PainSc:       Patients Stated Pain Goal: 3 (12/13/81 5003)  Complications: No complications documented.

## 2020-08-21 NOTE — H&P (Signed)
1 - Recurrent Nephrolithiasis -  2013 - Lt 2 stage ureteroscopy for approx 1.6cm UPJ stone  2018 - Lt ureteroscoy for 27mm UPJ stone   Recent Surveilance:  08/2018 - [facility] 80mm stone (?parenchymal) and stable Rt rentl cyst.  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    2- Metabolic Stone Disease -   Metabolic Eval 09/6268: Composittion 80% Uric Acid + 20% CaOx. BMP, PTH, Urate - normal. 24 hr urine -high urate extrection 0.92   2018 - Composition 90% Urate / 10% CaOx ==> BID crystal lite for alkalinization + 200mg  allopurinol   3 - Stress Urinary Incontinence - s/p sling at time of hyst by Teigen Parslow 01/2018.   4 - Righl Renal Cyst - RLP by CT 2013 and stable 2018 (non-con) 3.6cm. Dedicated contrast imaging through her PCP 2019 without enhancement confirming simple cyst.   PMH sig for DM2 /obesity.   Today [place] presents in acute pain. She states that she woke up at about 4:30 AM with acute onset left flank pain. She states that her pain radiates to her abdomen. Pain is currently severe. She has associated nausea. She states that this pain feelsl similar to past stone events. No complaints of gross hematuria. She denies dysuria or significant exacerbation in voiding symptoms. No fevers or chills. She denies issues with general anesthesia in the past and has had multiple procedures in the past for stone events.     ALLERGIES: Hydrocodone - Nausea Percocet TABS - cold sweats Vicodin - Dizziness    MEDICATIONS: Metformin Hcl 500 mg tablet  Aspir 81  Atorvastatin Calcium 10 mg tablet  Magnesium  Olmesartan Medoxomil  Omega 3     GU PSH: Complex cystometrogram, w/ void pressure and urethral pressure profile studies, any technique - 2018 Complex Uroflow - 2018 Cysto Uretero Lithotripsy - 2013 Cystoscopy Insert Stent, Left - 2018, 2013, 2013, 2013 Emg surf Electrd - 2018 Inject For cystogram - 2018 Intrabd voidng Press - 2018 Sling -  2019 Ureteroscopic laser litho, Left - 2018 Ureteroscopic stone removal - 2013       [place] Notes: Cystoscopy With Ureteroscopy With Removal Of Calculus, Cystoscopy With Insertion Of Ureteral Stent Left, Cystoscopy With Ureteroscopy With Lithotripsy, Cystoscopy With Insertion Of Ureteral Stent Left, Cystoscopy With Insertion Of Ureteral Stent Left, Tubal Ligation, Uterine Fibroid Embolization   NON-GU PSH: Laser Surgery Eye Tubal Ligation - 2013           GU PMH: Renal cyst (Stable, Chronic), Right - 03/28/5461 Right uncertain neoplasm of kidney (Stable, Chronic), Right - 2019 Incomplete bladder emptying - 2018 Nocturia - 2018 Stress Incontinence (Worsening, Chronic) - 2018 Oth GU systems Signs/Symptoms, Bladder pain - 2014 Renal calculus, Nephrolithiasis - 2014 Ureteral calculus, Calculus of ureter - 2014     NON-GU PMH: Asthma, Asthma - 2014 Arthritis Diabetes Type 2 Encounter for general adult medical examination without abnormal findings, Encounter for preventive health examination Hypercholesterolemia     FAMILY HISTORY: 2 daughters - Daughter 1 son - Son nephrolithiasis - Father Prostate Cancer - Grandfather    SOCIAL HISTORY: Marital Status: Married Preferred Language: English; Ethnicity: Not Hispanic Or Latino; Race: White Current Smoking Status: Patient has never smoked.  <DIV'  Tobacco Use Assessment Completed:  Used Tobacco in last 30 days?   Has never drank.  Drinks 1 caffeinated drink per day. 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     REVIEW OF SYSTEMS:  GU Review Female:  Patient denies frequent urination, hard to postpone urination, burning /pain with urination, get up at night to urinate, leakage of urine, stream starts and stops, trouble starting your stream, have to strain to urinate, and being pregnant.     Gastrointestinal (Upper):  Patient reports nausea. Patient denies vomiting and indigestion/ heartburn.     Gastrointestinal (Lower):   Patient denies diarrhea and constipation.     Constitutional:  Patient denies fever, night sweats, weight loss, and fatigue.     Skin:  Patient denies skin rash/ lesion and itching.     Eyes:  Patient denies blurred vision and double vision.     Ears/ Nose/ Throat:  Patient denies sore throat and sinus problems.     Hematologic/Lymphatic:  Patient denies swollen glands and easy bruising.     Cardiovascular:  Patient denies leg swelling and chest pains.     Respiratory:  Patient denies cough and shortness of breath.     Endocrine:  Patient denies excessive thirst.     Musculoskeletal:  Patient reports back pain. Patient denies joint pain.     Neurological:  Patient denies headaches and dizziness.     Psychologic:  Patient denies depression and anxiety.     VITAL SIGNS:        08/21/2020 08:19 AM      Weight 200 lb / 90.72 kg      Height 67 in / 170.18 cm      BP 191/103 mmHg      Heart Rate 70 /min      Temperature 97.5 F / 36.3 C      BMI 31.3 kg/m      MULTI-SYSTEM PHYSICAL EXAMINATION:       Constitutional: Well-nourished. No physical deformities. Normally developed. Good grooming. In acute pain. Moaning and pacing the room.       Respiratory: No labored breathing, no use of accessory muscles.       Cardiovascular: Normal temperature, normal extremity pulses, no swelling, no varicosities.       Neurologic / Psychiatric: Oriented to time, oriented to place, oriented to person. No depression, no anxiety, no agitation.      Gastrointestinal: No mass, no tenderness, no rigidity, non obese abdomen. Left CVAT.       Musculoskeletal: Normal gait and station of head and neck.              Complexity of Data:   Records Review:  Previous Patient Records  Urine Test Review:  Urinalysis  X-Ray Review: C.T. Abdomen/Pelvis: Reviewed Films. Reviewed Report.     PROCEDURES:    C.T. Urogram - [id number]      Patient confirmed No Neulasta OnPro Device.    Urinalysis w/Scope  Dipstick  Dipstick Cont'd Micro  Color: Yellow Bilirubin: Neg mg/dL WBC/hpf: 0 - 5/hpf  Appearance: Slightly Cloudy Ketones: Trace mg/dL RBC/hpf: 10 - 20/hpf  Specific Gravity: 1.025 Blood: 1+ ery/uL Bacteria: Mod (26-50/hpf)  pH: 5.5 Protein: 1+ mg/dL Cystals: NS (Not Seen)  Glucose: 2+ mg/dL Urobilinogen: 0.2 mg/dL Casts: NS (Not Seen)   Nitrites: Neg Trichomonas: Not Present   Leukocyte Esterase: Neg leu/uL Mucous: Not Present    Epithelial Cells: 0 - 5/hpf    Yeast: NS (Not Seen)    Sperm: Not Present    Ketoralac 60mg  - [id number], W4665  Qty: 60 Adm. By: Christella Scheuermann  Unit: mg Lot No [id number]  Route: IM Exp. Date 08/21/2021  Freq: None Mfgr.:   Site: Left  Buttock  Phenergan 25mg  - [id number], J2550  The injection site was sterilely prepped with alcohol. Phenergan was injected (IM) using standard technique. The patient tolerated the procedure well. A band aid was applied. The site was dry when the patient left the exam room.      Qty: 25 Adm. By: Leonette Nutting  Unit: mg Lot No [id number]  Route: IM Exp. Date 08/21/2021  Freq: None Mfgr.:   Site: Right Buttock   ASSESSMENT:     ICD-10 Details  1 GU:  Renal calculus - N20.0   2  Flank Pain - R10.84   3  Microscopic hematuria - R31.21    PLAN:   Orders  Labs Urine Culture  X-Rays: C.T. Stone Protocol Without Contrast  Schedule  Procedure: 08/21/2020 at [facility] [place] 60mg  (Toradol Per 15 Mg) - [id number], [phone]  Procedure: 08/21/2020 at [facility] - [phone] - Phenergan 25mg  (Phenergan Per 50 Mg) - [id number], [id number]  Document  Letter(s):  Created for Patient: Clinical Summary   Notes:  Precautionary culture sent today. CT imaging revealed an approximately 7 x 9 mm left UPJ calculus with associated hydronephrosis. She was given injections of Ketoralac and Phenergan today in clinic with noted improvement in acute pain and nausea. She does have a difficult time tolerating oral pain medication  and is concerned about being able to manage her pain, as this has been historically an issue for her with prior stone events. We reviewed options today in detail including temporizing her situation with placement of ureteral stent until definitive treatment can be scheduled. She would like to proceed with this. We reviewed the procedure and potential risks today in detail. She is aware and would like to proceed.. Case reviewed with on-call urologist who is in agreement. Will plan to schedule for cystoscopy, left retrograde pyelogram, and left ureteral stent placement later this afternoon. She will remain NPO from this point forward.   After a thorough review of the management options for the patient's condition the patient  elected to proceed with surgical therapy as noted above. We have discussed the potential benefits and risks of the procedure, side effects of the proposed treatment, the likelihood of the patient achieving the goals of the procedure, and any potential problems that might occur during the procedure or recuperation. Informed consent has been obtained.

## 2020-08-21 NOTE — Discharge Summary (Signed)
Date of admission: 08/21/2020  Date of discharge: 08/21/2020  Admission diagnosis: left ureter stone and UTI  Discharge diagnosis: Left ureter stone and UTI  Secondary diagnoses: UTI  History and Physical: For full details, please see admission history and physical. Briefly, Whitney Shelton is a 54 y.o. year old patient with above diagnosis.   Hospital Course: Patient had outpatient surgery with left retrograde and left stent and cystoscopy  Laboratory values: No results for input(s): HGB, HCT in the last 72 hours. Recent Labs    08/21/20 1157  CREATININE 0.94    Disposition: Home  Discharge instruction: The patient was instructed to be ambulatory but told to refrain from heavy lifting, strenuous activity, or driving.  Detailed  Discharge medications:  Allergies as of 08/21/2020      Reactions   Diazepam Nausea And Vomiting   Other reaction(s): Dizziness COLD SWEATS COLD SWEATS   Hydrocodone Nausea And Vomiting   Other reaction(s): Dizziness COLD SWEATS COLD SWEATS   Vicodin [hydrocodone-acetaminophen] Nausea Only   Percocet [oxycodone-acetaminophen] Nausea Only      Medication List    TAKE these medications   albuterol 108 (90 Base) MCG/ACT inhaler Commonly known as: VENTOLIN HFA Inhale 1 puff into the lungs every 4 (four) hours as needed for wheezing or shortness of breath.   allopurinol 100 MG tablet Commonly known as: ZYLOPRIM Take 100 mg by mouth at bedtime.   APPLE CIDER VINEGAR PO Take 1 tablet by mouth daily.   ascorbic acid 1000 MG tablet Commonly known as: VITAMIN C Take 1,000 mg by mouth daily.   aspirin-acetaminophen-caffeine 250-250-65 MG tablet Commonly known as: EXCEDRIN MIGRAINE Take 2 tablets by mouth every 6 (six) hours as needed for headache.   atorvastatin 10 MG tablet Commonly known as: LIPITOR Take 1 tablet by mouth at bedtime.   Cholecalciferol 25 MCG (1000 UT) tablet Take 1,000 Units by mouth daily.   ELDERBERRY PO Take 1 mg by mouth  daily.   magnesium gluconate 500 MG tablet Commonly known as: MAGONATE Take 500 mg by mouth daily.   metFORMIN 500 MG 24 hr tablet Commonly known as: GLUCOPHAGE-XR Take 2 tablets (1,000 mg total) by mouth daily.   MILK THISTLE PO Take 1 tablet by mouth daily.   omega-3 acid ethyl esters 1 g capsule Commonly known as: LOVAZA Take 1 g by mouth daily.   Zytaze 25-500 MG Caps Generic drug: Zinc Citrate-Phytase Take 1 tablet by mouth daily.       Followup:   Follow-up Information    Alexis Frock, MD.   Specialty: Urology Why: As scheduled- will call her Contact information: [address] [place] [address] [phone]

## 2020-08-21 NOTE — Anesthesia Procedure Notes (Signed)
Procedure Name: LMA Insertion Date/Time: 08/21/2020 3:25 PM Performed by: Niel Hummer, CRNA Pre-anesthesia Checklist: Patient identified, Emergency Drugs available, Suction available and Patient being monitored Patient Re-evaluated:Patient Re-evaluated prior to induction Oxygen Delivery Method: Circle system utilized Preoxygenation: Pre-oxygenation with 100% oxygen Induction Type: IV induction Ventilation: Mask ventilation without difficulty LMA: LMA inserted LMA Size: 4.0 Tube type: Oral Number of attempts: 1 Dental Injury: Teeth and Oropharynx as per pre-operative assessment

## 2020-08-21 NOTE — Anesthesia Preprocedure Evaluation (Signed)
Anesthesia Evaluation   Patient awake  General Assessment Comment:Hard to wake up from Anesthesia  Reviewed: Allergy & Precautions, NPO status , Patient's Chart, lab work & pertinent test results  Airway Mallampati: II  TM Distance: >3 FB Neck ROM: Full    Dental no notable dental hx. (+) Teeth Intact, Dental Advisory Given   Pulmonary asthma ,    Pulmonary exam normal breath sounds clear to auscultation       Cardiovascular Exercise Tolerance: Good negative cardio ROS Normal cardiovascular exam Rhythm:Regular Rate:Normal     Neuro/Psych  Headaches, negative psych ROS   GI/Hepatic negative GI ROS, Neg liver ROS,   Endo/Other  diabetes, Type 2, Oral Hypoglycemic Agents  Renal/GU Nephrolithiasis K+ 4.5     Musculoskeletal negative musculoskeletal ROS (+)   Abdominal (+) + obese,   Peds  Hematology negative hematology ROS (+)   Anesthesia Other Findings   Reproductive/Obstetrics                             Anesthesia Physical Anesthesia Plan  ASA: III  Anesthesia Plan: General   Post-op Pain Management:    Induction: Intravenous  PONV Risk Score and Plan: TIVA, Treatment may vary due to age or medical condition, Ondansetron, Dexamethasone, Scopolamine patch - Pre-op and Midazolam  Airway Management Planned: LMA  Additional Equipment: None  Intra-op Plan:   Post-operative Plan:   Informed Consent: I have reviewed the patients History and Physical, chart, labs and discussed the procedure including the risks, benefits and alternatives for the proposed anesthesia with the patient or authorized representative who has indicated his/her understanding and acceptance.     Dental advisory given  Plan Discussed with:   Anesthesia Plan Comments:         Anesthesia Quick Evaluation

## 2020-08-22 ENCOUNTER — Other Ambulatory Visit: Payer: Self-pay | Admitting: Urology

## 2020-08-22 ENCOUNTER — Encounter (HOSPITAL_COMMUNITY): Payer: Self-pay | Admitting: Urology

## 2020-08-27 NOTE — Progress Notes (Signed)
DUE TO COVID-19 ONLY ONE VISITOR IS ALLOWED TO COME WITH YOU AND STAY IN THE WAITING ROOM ONLY DURING PRE OP AND PROCEDURE DAY OF SURGERY. THE 1 VISITOR  MAY VISIT WITH YOU AFTER SURGERY IN YOUR PRIVATE ROOM DURING VISITING HOURS ONLY!  YOU NEED TO HAVE A COVID 19 TEST ON_9/06/2020 ______ @_______ , THIS TEST MUST BE DONE BEFORE SURGERY,  COVID TESTING SITE [address] [place] [id number], IT IS ON THE RIGHT GOING OUT [address] APPROXIMATELY  2 MINUTES PAST [employer] ON THE RIGHT. ONCE YOUR COVID TEST IS COMPLETED,  PLEASE BEGIN THE QUARANTINE INSTRUCTIONS AS OUTLINED IN YOUR HANDOUT.                KATHLYNE LOUD  08/27/2020   Your procedure is scheduled on: 08/30/2020       Report to [facility] Main  Entrance   Report to admitting at 1230 pm     Call this number if you have problems the morning of surgery [phone]    Remember: Do not eat food , candy gum or mints :After Midnight. You may have clear liquids from midnight until    1130am    CLEAR LIQUID DIET   Foods Allowed                                                                       Coffee and tea, regular and decaf                              Plain Jell-O any favor except red or purple                                            Fruit ices (not with fruit pulp)                                      Iced Popsicles                                     Carbonated beverages, regular and diet                                    Cranberry, grape and apple juices Sports drinks like Gatorade Lightly seasoned clear broth or consume(fat free) Sugar, honey syrup   _____________________________________________________________________    BRUSH YOUR TEETH MORNING OF SURGERY AND RINSE YOUR MOUTH OUT, NO CHEWING GUM CANDY OR MINTS.     Take these medicines the morning of surgery with A SIP OF WATER:  Inhalers as usual and bring,   DO NOT TAKE ANY DIABETIC MEDICATIONS DAY OF YOUR SURGERY                                You  may not have any metal on your body including hair pins and              piercings  Do not wear jewelry, make-up, lotions, powders or perfumes, deodorant             Do not wear nail polish on your fingernails.  Do not shave  48 hours prior to surgery.              Men may shave face and neck.   Do not bring valuables to the hospital. [place].  Contacts, dentures or bridgework may not be worn into surgery.  Leave suitcase in the car. After surgery it may be brought to your room.     Patients discharged the day of surgery will not be allowed to drive home. IF YOU ARE HAVING SURGERY AND GOING HOME THE SAME DAY, YOU MUST HAVE AN ADULT TO DRIVE YOU HOME AND BE WITH YOU FOR 24 HOURS. YOU MAY GO HOME BY TAXI OR UBER OR ORTHERWISE, BUT AN ADULT MUST ACCOMPANY YOU HOME AND STAY WITH YOU FOR 24 HOURS.  Name and phone number of your driver:  Special Instructions: N/A              Please read over the following fact sheets you were given: _____________________________________________________________________  [facility] - Preparing for Surgery Before surgery, you can play an important role.  Because skin is not sterile, your skin needs to be as free of germs as possible.  You can reduce the number of germs on your skin by washing with CHG (chlorahexidine gluconate) soap before surgery.  CHG is an antiseptic cleaner which kills germs and bonds with the skin to continue killing germs even after washing. Please DO NOT use if you have an allergy to CHG or antibacterial soaps.  If your skin becomes reddened/irritated stop using the CHG and inform your nurse when you arrive at Short Stay. Do not shave (including legs and underarms) for at least 48 hours prior to the first CHG shower.  You may shave your face/neck. Please follow these instructions carefully:  1.  Shower with CHG Soap the night before surgery and the  morning of  Surgery.  2.  If you choose to wash your hair, wash your hair first as usual with your  normal  shampoo.  3.  After you shampoo, rinse your hair and body thoroughly to remove the  shampoo.                           4.  Use CHG as you would any other liquid soap.  You can apply chg directly  to the skin and wash                       Gently with a scrungie or clean washcloth.  5.  Apply the CHG Soap to your body ONLY FROM THE NECK DOWN.   Do not use on face/ open                           Wound or open sores. Avoid contact with eyes, ears mouth and genitals (private parts).  Wash face,  Genitals (private parts) with your normal soap.             6.  Wash thoroughly, paying special attention to the area where your surgery  will be performed.  7.  Thoroughly rinse your body with warm water from the neck down.  8.  DO NOT shower/wash with your normal soap after using and rinsing off  the CHG Soap.                9.  Pat yourself dry with a clean towel.            10.  Wear clean pajamas.            11.  Place clean sheets on your bed the night of your first shower and do not  sleep with pets. Day of Surgery : Do not apply any lotions/deodorants the morning of surgery.  Please wear clean clothes to the hospital/surgery center.  FAILURE TO FOLLOW THESE INSTRUCTIONS MAY RESULT IN THE CANCELLATION OF YOUR SURGERY PATIENT SIGNATURE_________________________________  NURSE SIGNATURE__________________________________  ________________________________________________________________________

## 2020-08-28 ENCOUNTER — Other Ambulatory Visit (HOSPITAL_COMMUNITY)
Admission: RE | Admit: 2020-08-28 | Discharge: 2020-08-28 | Disposition: A | Discharge status: Home / Self Care | Payer: 59 | Source: Ambulatory Visit | Attending: Urology | Admitting: Urology

## 2020-08-28 ENCOUNTER — Encounter (HOSPITAL_COMMUNITY): Payer: Self-pay | Admitting: Urology

## 2020-08-28 ENCOUNTER — Other Ambulatory Visit: Payer: Self-pay

## 2020-08-28 ENCOUNTER — Encounter (HOSPITAL_COMMUNITY)
Admission: RE | Admit: 2020-08-28 | Discharge: 2020-08-28 | Disposition: A | Discharge status: Home / Self Care | Payer: 59 | Source: Ambulatory Visit | Attending: Urology | Admitting: Urology

## 2020-08-28 ENCOUNTER — Other Ambulatory Visit: Payer: Self-pay | Admitting: Urology

## 2020-08-28 DIAGNOSIS — Z20822 Contact with and (suspected) exposure to covid-19: Secondary | ICD-10-CM | POA: Insufficient documentation

## 2020-08-28 DIAGNOSIS — Z01812 Encounter for preprocedural laboratory examination: Secondary | ICD-10-CM | POA: Insufficient documentation

## 2020-08-28 LAB — HEMOGLOBIN A1C
Hgb A1c MFr Bld: 10.1 % — ABNORMAL HIGH (ref 4.8–5.6)
Mean Plasma Glucose: 243.17 mg/dL

## 2020-08-28 LAB — BASIC METABOLIC PANEL
Anion gap: 12 (ref 5–15)
BUN: 18 mg/dL (ref 6–20)
CO2: 21 mmol/L — ABNORMAL LOW (ref 22–32)
Calcium: 9.4 mg/dL (ref 8.9–10.3)
Chloride: 102 mmol/L (ref 98–111)
Creatinine, Ser: 0.74 mg/dL (ref 0.44–1.00)
GFR calc Af Amer: >60 mL/min (ref >60)
GFR calc non Af Amer: >60 mL/min (ref >60)
Glucose, Bld: 215 mg/dL — ABNORMAL HIGH (ref 70–99)
Potassium: 4.6 mmol/L (ref 3.5–5.1)
Sodium: 135 mmol/L (ref 135–145)

## 2020-08-28 LAB — CBC
HCT: 42.8 % (ref 36.0–46.0)
Hemoglobin: 14.1 g/dL (ref 12.0–15.0)
MCH: 29.3 pg (ref 26.0–34.0)
MCHC: 32.9 g/dL (ref 30.0–36.0)
MCV: 88.8 fL (ref 80.0–100.0)
Platelets: 237 10*3/uL (ref 150–400)
RBC: 4.82 MIL/uL (ref 3.87–5.11)
RDW: 13.2 % (ref 11.5–15.5)
WBC: 8.7 10*3/uL (ref 4.0–10.5)
nRBC: 0 % (ref 0.0–0.2)

## 2020-08-28 LAB — SARS CORONAVIRUS 2 (TAT 6-24 HRS): SARS Coronavirus 2: NEGATIVE

## 2020-08-28 NOTE — Progress Notes (Addendum)
Anesthesia Review:  PCP: Was Glendon Axe at [facility] last office visit several months ago per patient , does not plan to go back to them in process of switching PCP Cardiologist : none  Chest x-ray :   11/05/2019 - positive for coviid pneumonia in 10/2019 in hospital  EKG :11/06/2019  Echo : Stress test: Cardiac Cath :  Activity level: can do a flight of stairs without difficulty  Sleep Study/ CPAP : Fasting Blood Sugar :      / Checks Blood Sugar -- times a day:   Blood Thinner/ Instructions /Last Dose: ASA / Instructions/ Last Dose :  Patient reports some Sob since 10/2019 covid pneumonia .  Also hx of bronchial asthma. Not followed by a pulmonologist.   Recent loss of child 2 mos ago.  Recent loss of job  Blood pressure at preop was 170/95- pt reports last blood pressure medication and dose was Olmesartan - 5 months ago.   DM- type 2 - AIC on 08/28/20- 10.1 routed to DR [facility] in epic

## 2020-08-29 ENCOUNTER — Other Ambulatory Visit: Payer: Self-pay | Admitting: Urology

## 2020-08-30 ENCOUNTER — Ambulatory Visit (HOSPITAL_COMMUNITY): Payer: 59

## 2020-08-30 ENCOUNTER — Encounter (HOSPITAL_COMMUNITY)
Admission: RE | Disposition: A | Discharge status: Home / Self Care | Payer: Self-pay | Source: Home / Self Care | Attending: Urology

## 2020-08-30 ENCOUNTER — Ambulatory Visit (HOSPITAL_COMMUNITY): Payer: 59 | Admitting: Physician Assistant

## 2020-08-30 ENCOUNTER — Encounter (HOSPITAL_COMMUNITY): Payer: Self-pay | Admitting: Urology

## 2020-08-30 ENCOUNTER — Ambulatory Visit (HOSPITAL_COMMUNITY): Payer: 59 | Admitting: Certified Registered"

## 2020-08-30 ENCOUNTER — Ambulatory Visit (HOSPITAL_COMMUNITY)
Admission: RE | Admit: 2020-08-30 | Discharge: 2020-08-30 | Disposition: A | Discharge status: Home / Self Care | Payer: 59 | Attending: Urology | Admitting: Urology

## 2020-08-30 DIAGNOSIS — N202 Calculus of kidney with calculus of ureter: Secondary | ICD-10-CM | POA: Diagnosis present

## 2020-08-30 DIAGNOSIS — E669 Obesity, unspecified: Secondary | ICD-10-CM | POA: Diagnosis not present

## 2020-08-30 DIAGNOSIS — N201 Calculus of ureter: Secondary | ICD-10-CM | POA: Diagnosis not present

## 2020-08-30 DIAGNOSIS — Z7984 Long term (current) use of oral hypoglycemic drugs: Secondary | ICD-10-CM | POA: Insufficient documentation

## 2020-08-30 DIAGNOSIS — Z8616 Personal history of COVID-19: Secondary | ICD-10-CM | POA: Insufficient documentation

## 2020-08-30 DIAGNOSIS — Z6836 Body mass index (BMI) 36.0-36.9, adult: Secondary | ICD-10-CM | POA: Insufficient documentation

## 2020-08-30 DIAGNOSIS — Z885 Allergy status to narcotic agent status: Secondary | ICD-10-CM | POA: Diagnosis not present

## 2020-08-30 DIAGNOSIS — E119 Type 2 diabetes mellitus without complications: Secondary | ICD-10-CM | POA: Insufficient documentation

## 2020-08-30 DIAGNOSIS — Z79899 Other long term (current) drug therapy: Secondary | ICD-10-CM | POA: Diagnosis not present

## 2020-08-30 DIAGNOSIS — J45909 Unspecified asthma, uncomplicated: Secondary | ICD-10-CM | POA: Insufficient documentation

## 2020-08-30 DIAGNOSIS — Z87442 Personal history of urinary calculi: Secondary | ICD-10-CM | POA: Insufficient documentation

## 2020-08-30 DIAGNOSIS — Z888 Allergy status to other drugs, medicaments and biological substances status: Secondary | ICD-10-CM | POA: Insufficient documentation

## 2020-08-30 HISTORY — DX: Pneumonia due to coronavirus disease 2019: J12.82

## 2020-08-30 HISTORY — DX: Dyspnea, unspecified: R06.00

## 2020-08-30 HISTORY — DX: Unspecified asthma, uncomplicated: J45.909

## 2020-08-30 HISTORY — PX: CYSTOSCOPY WITH RETROGRADE PYELOGRAM, URETEROSCOPY AND STENT PLACEMENT: SHX5789

## 2020-08-30 HISTORY — DX: COVID-19: U07.1

## 2020-08-30 HISTORY — DX: Personal history of urinary calculi: Z87.442

## 2020-08-30 HISTORY — PX: HOLMIUM LASER APPLICATION: SHX5852

## 2020-08-30 LAB — GLUCOSE, CAPILLARY: Glucose-Capillary: 199 mg/dL — ABNORMAL HIGH (ref 70–99)

## 2020-08-30 SURGERY — CYSTOURETEROSCOPY, WITH RETROGRADE PYELOGRAM AND STENT INSERTION
Anesthesia: General | Laterality: Left

## 2020-08-30 MED ORDER — IOHEXOL 300 MG/ML  SOLN
INTRAMUSCULAR | Status: DC | PRN
  Administered 2020-08-30: 17 mL

## 2020-08-30 MED ORDER — PROPOFOL 10 MG/ML IV BOLUS
INTRAVENOUS | Status: DC | PRN
  Administered 2020-08-30: 150 mg via INTRAVENOUS

## 2020-08-30 MED ORDER — AMISULPRIDE (ANTIEMETIC) 5 MG/2ML IV SOLN
INTRAVENOUS | Status: AC
  Filled 2020-08-30: qty 4

## 2020-08-30 MED ORDER — LIDOCAINE 2% (20 MG/ML) 5 ML SYRINGE
INTRAMUSCULAR | Status: AC
  Filled 2020-08-30: qty 5

## 2020-08-30 MED ORDER — CIPROFLOXACIN IN D5W 400 MG/200ML IV SOLN
INTRAVENOUS | Status: AC
  Filled 2020-08-30: qty 200

## 2020-08-30 MED ORDER — AMISULPRIDE (ANTIEMETIC) 5 MG/2ML IV SOLN
10.0000 mg | Freq: Once | INTRAVENOUS | Status: AC
  Administered 2020-08-30: 10 mg via INTRAVENOUS

## 2020-08-30 MED ORDER — MIDAZOLAM HCL 2 MG/2ML IJ SOLN
INTRAMUSCULAR | Status: DC | PRN
  Administered 2020-08-30: 2 mg via INTRAVENOUS

## 2020-08-30 MED ORDER — SENNOSIDES-DOCUSATE SODIUM 8.6-50 MG PO TABS: 1.0000 | ORAL_TABLET | Freq: Two times a day (BID) | ORAL | 0 refills | Status: DC

## 2020-08-30 MED ORDER — KETOROLAC TROMETHAMINE 10 MG PO TABS: 10.0000 mg | ORAL_TABLET | Freq: Three times a day (TID) | ORAL | 0 refills | Status: DC | PRN

## 2020-08-30 MED ORDER — SODIUM CHLORIDE 0.9 % IR SOLN
Status: DC | PRN
  Administered 2020-08-30: 3000 mL

## 2020-08-30 MED ORDER — OXYCODONE-ACETAMINOPHEN 5-325 MG PO TABS: 1.0000 | ORAL_TABLET | Freq: Four times a day (QID) | ORAL | 0 refills | Status: DC | PRN

## 2020-08-30 MED ORDER — DEXAMETHASONE SODIUM PHOSPHATE 10 MG/ML IJ SOLN
INTRAMUSCULAR | Status: DC | PRN
  Administered 2020-08-30: 10 mg via INTRAVENOUS

## 2020-08-30 MED ORDER — MIDAZOLAM HCL 2 MG/2ML IJ SOLN
INTRAMUSCULAR | Status: AC
  Filled 2020-08-30: qty 2

## 2020-08-30 MED ORDER — DEXAMETHASONE SODIUM PHOSPHATE 10 MG/ML IJ SOLN
INTRAMUSCULAR | Status: AC
  Filled 2020-08-30: qty 1

## 2020-08-30 MED ORDER — CEFAZOLIN SODIUM-DEXTROSE 1-4 GM/50ML-% IV SOLN
INTRAVENOUS | Status: DC | PRN
  Administered 2020-08-30: 2 g via INTRAVENOUS

## 2020-08-30 MED ORDER — ORAL CARE MOUTH RINSE: 15.0000 mL | Freq: Once | OROMUCOSAL | Status: AC

## 2020-08-30 MED ORDER — PROPOFOL 10 MG/ML IV BOLUS
INTRAVENOUS | Status: AC
  Filled 2020-08-30: qty 20

## 2020-08-30 MED ORDER — PROMETHAZINE HCL 25 MG/ML IJ SOLN: 6.2500 mg | INTRAMUSCULAR | Status: DC | PRN

## 2020-08-30 MED ORDER — FENTANYL CITRATE (PF) 100 MCG/2ML IJ SOLN
INTRAMUSCULAR | Status: AC
  Filled 2020-08-30: qty 2

## 2020-08-30 MED ORDER — CEFAZOLIN SODIUM-DEXTROSE 2-4 GM/100ML-% IV SOLN
INTRAVENOUS | Status: AC
  Filled 2020-08-30: qty 100

## 2020-08-30 MED ORDER — FENTANYL CITRATE (PF) 100 MCG/2ML IJ SOLN
INTRAMUSCULAR | Status: DC
Start: 2020-08-30 — End: 2020-08-30
  Filled 2020-08-30: qty 2

## 2020-08-30 MED ORDER — ONDANSETRON HCL 4 MG/2ML IJ SOLN
INTRAMUSCULAR | Status: AC
  Filled 2020-08-30: qty 2

## 2020-08-30 MED ORDER — LIDOCAINE 2% (20 MG/ML) 5 ML SYRINGE
INTRAMUSCULAR | Status: DC | PRN
  Administered 2020-08-30: 80 mg via INTRAVENOUS

## 2020-08-30 MED ORDER — FENTANYL CITRATE (PF) 100 MCG/2ML IJ SOLN
25.0000 ug | INTRAMUSCULAR | Status: DC | PRN
  Administered 2020-08-30: 25 ug via INTRAVENOUS

## 2020-08-30 MED ORDER — FENTANYL CITRATE (PF) 100 MCG/2ML IJ SOLN
INTRAMUSCULAR | Status: DC | PRN
Start: 2020-08-30 — End: 2020-08-30
  Administered 2020-08-30 (×4): 50 ug via INTRAVENOUS

## 2020-08-30 MED ORDER — CHLORHEXIDINE GLUCONATE 0.12 % MT SOLN
15.0000 mL | Freq: Once | OROMUCOSAL | Status: AC
  Administered 2020-08-30: 15 mL via OROMUCOSAL

## 2020-08-30 MED ORDER — CIPROFLOXACIN IN D5W 400 MG/200ML IV SOLN
INTRAVENOUS | Status: DC | PRN
  Administered 2020-08-30: 400 mg via INTRAVENOUS

## 2020-08-30 MED ORDER — AMOXICILLIN-POT CLAVULANATE 875-125 MG PO TABS: 1.0000 | ORAL_TABLET | Freq: Two times a day (BID) | ORAL | 0 refills | Status: AC

## 2020-08-30 MED ORDER — ONDANSETRON HCL 4 MG/2ML IJ SOLN
INTRAMUSCULAR | Status: DC | PRN
  Administered 2020-08-30: 4 mg via INTRAVENOUS

## 2020-08-30 MED ORDER — LACTATED RINGERS IV SOLN: INTRAVENOUS | Status: DC

## 2020-08-30 SURGICAL SUPPLY — 27 items
BAG URO CATCHER STRL LF (MISCELLANEOUS) ×3 IMPLANT
BASKET LASER NITINOL 1.9FR (BASKET) ×3 IMPLANT
BASKET STONE NCOMPASS (UROLOGICAL SUPPLIES) IMPLANT
CATH INTERMIT  6FR 70CM (CATHETERS) ×3 IMPLANT
CLOTH BEACON ORANGE TIMEOUT ST (SAFETY) ×3 IMPLANT
EXTRACTOR STONE 1.7FRX115CM (UROLOGICAL SUPPLIES) IMPLANT
FIBER LASER MOSES 200 DFL (Laser) ×3 IMPLANT
GLOVE BIOGEL M STRL SZ7.5 (GLOVE) ×3 IMPLANT
GOWN STRL REUS W/TWL LRG LVL3 (GOWN DISPOSABLE) ×3 IMPLANT
GUIDEWIRE ANG ZIPWIRE 038X150 (WIRE) ×3 IMPLANT
GUIDEWIRE STR DUAL SENSOR (WIRE) ×3 IMPLANT
KIT TURNOVER KIT A (KITS) IMPLANT
LASER FIB FLEXIVA PULSE ID 365 (Laser) IMPLANT
LASER FIB FLEXIVA PULSE ID 550 (Laser) IMPLANT
LASER FIB FLEXIVA PULSE ID 910 (Laser) IMPLANT
MANIFOLD NEPTUNE II (INSTRUMENTS) ×3 IMPLANT
PACK CYSTO (CUSTOM PROCEDURE TRAY) ×3 IMPLANT
SHEATH URETERAL 12FRX28CM (UROLOGICAL SUPPLIES) ×3 IMPLANT
SHEATH URETERAL 12FRX35CM (MISCELLANEOUS) IMPLANT
STENT POLARIS 5FRX26 (STENTS) ×3 IMPLANT
SYR 30ML LL (SYRINGE) ×3 IMPLANT
TRACTIP FLEXIVA PULS ID 200XHI (Laser) IMPLANT
TRACTIP FLEXIVA PULSE ID 200 (Laser)
TUBE FEEDING 8FR 16IN STR KANG (MISCELLANEOUS) ×3 IMPLANT
TUBING CONNECTING 10 (TUBING) ×2 IMPLANT
TUBING CONNECTING 10' (TUBING) ×1
TUBING UROLOGY SET (TUBING) ×3 IMPLANT

## 2020-08-30 NOTE — Op Note (Signed)
NAME: Whitney Shelton, Whitney Shelton MEDICAL RECORD OI:[id number] ACCOUNT [id number] DATE OF BIRTH:October 24, 1966 FACILITY: [facility] LOCATION: Lona Millard, MD  OPERATIVE REPORT  DATE OF PROCEDURE:  08/30/2020  SURGEON:  Alexis Frock, MD  PREOPERATIVE DIAGNOSIS:  Recurrent left renal and ureteral stone.  PROCEDURE: 1.  Cystoscopy, left retrograde pyelogram, interpretation. 2.  Left ureteroscopy with laser lithotripsy. 3.  Exchange of left ureteral stent, 5 x 26 Polaris, no tether.  ESTIMATED BLOOD LOSS:  Nil.  MEDICATIONS:  None.  SPECIMENS:  Left renal stone fragments for composition analysis.  FINDINGS: 1.  Retrograde positioning of prior left proximal ureteral stone into mid pole calix. 2.  Complete resolution of all accessible stone fragments larger than one-third mm following laser lithotripsy and basket extraction. 3.  Successful replacement of left ureteral stent, proximal end in renal pelvis, distal end in urinary bladder.  INDICATIONS:  The patient is a pleasant 54 year old lady with a longstanding history of recurrent urolithiasis.  She is on medical therapy for this, but still does have occasional recurrences.  She was found on workup for colicky flank pain to have a  recurrent left proximal ureteral stone that was quite large.  At the time, she had some mild infectious parameters.  Therefore, stenting was performed.  She has now cleared from infectious parameters systemically and presents for definitive stone  management today with ureteroscopy.  Informed consent was obtained and placed in the medical record.  DESCRIPTION OF PROCEDURE:  The patient was identified, the procedure being left ureteroscopic stone manipulation was confirmed.  Procedure timeout was performed.  Intravenous antibiotics were administered.  General anesthesia induced.  The patient was  placed into a low lithotomy position.  A sterile field was created, prepping and draping the patient's vagina,  introitus and proximal thighs using iodine.  Cystourethroscopy was performed using 21-French rigid cystoscope with offset lens.  Inspection of  urinary bladder revealed distal end of the left ureteral stent in situ.  It was grasped, brought to the level of the urethral meatus.  A 0.03 ZIPwire was advanced to lower pole and the stent was exchanged for open-ended catheter and left retrograde  pyelogram was obtained.  Left retrograde pyelogram demonstrates a single left ureter single system left kidney.  There were 2 large filling defects, 1 in the mid pole and lower pole consistent with known stone.  A ZIPwire was once again advanced and set aside as a safety wire.   An 8-French feeding tube was placed in the urinary bladder for pressure release and semirigid ureteroscopy performed distal four-fifths of the  left ureter alongside a separate Sensor working wire.  No mucosal masses were found.  The semirigid scope was  then exchanged for a 12/14 short length ureteral access sheath to the level of the proximal ureter using continuous fluoroscopic guidance and flexible digital ureteroscopy was performed of the proximal left ureter and systematic inspection of the left  kidney, including all calices x3.  As expected, there were 2 dominant calcifications, 1 in the midpole, 1 in the lower pole.  The mid pole stone corresponding likely to retrograde positioning of prior proximal ureteral stone.  These were much too large  for simple basketing.  They were grasped with the Escape basket and repositioned into an upper pole calix to allow for less acute angulation and holmium laser energy was then applied to the stone using settings of 0.2 joules and 70 Hz with a dusting  technique; approximately 80% of the stone volume was dusted, the remaining  fragmented.  The remaining representative fragments were grasped with an Escape basket, removed and set aside for composition analysis.  All remaining fragments were one-third mm   or less in diameter.  Given the significant volume of stones and inherent stone dust created,  it was felt that interval stenting with a nontethered stent would be most advantageous.  As such, the access sheath was removed under continuous vision and a  new 5 x 26 Polaris-type stent was placed over remaining safety wire using fluoroscopic guidance.  Good proximal and distal planes were noted.  The procedure was terminated.  The patient tolerated the procedure well.  There were no immediate perioperative  complications.  The patient did postanesthesia care unit in stable condition.  Plan for discharge home.  VN/NUANCE  D:08/30/2020 T:08/30/2020 JOB:[id number]

## 2020-08-30 NOTE — Discharge Instructions (Signed)
1 - You may have urinary urgency (bladder spasms) and bloody urine on / off with stent in place. This is normal. ° °2 - Call MD or go to ER for fever >102, severe pain / nausea / vomiting not relieved by medications, or acute change in medical status ° °

## 2020-08-30 NOTE — Anesthesia Postprocedure Evaluation (Signed)
Anesthesia Post Note  Patient: Whitney Shelton  Procedure(s) Performed: CYSTOSCOPY WITH RETROGRADE PYELOGRAM, URETEROSCOPY AND STENT REPLACEMENT (Left ) HOLMIUM LASER APPLICATION (Left )     Patient location during evaluation: PACU Anesthesia Type: General Level of consciousness: sedated Pain management: pain level controlled Vital Signs Assessment: post-procedure vital signs reviewed and stable Respiratory status: spontaneous breathing and respiratory function stable Cardiovascular status: stable Postop Assessment: no apparent nausea or vomiting Anesthetic complications: yes   Encounter Complications  Complication Outcome Phase Comment  Nausea  Intraprocedure     Last Vitals:  Vitals:   08/30/20 1645 08/30/20 1700  BP: (!) 157/91 (!) 160/93  Pulse: 73 76  Resp: 13 12  Temp: 36.5 C   SpO2: 99% 93%    Last Pain:  Vitals:   08/30/20 1645  TempSrc:   PainSc: 2                  Santo Zahradnik DANIEL

## 2020-08-30 NOTE — Transfer of Care (Signed)
Immediate Anesthesia Transfer of Care Note  Patient: Whitney Shelton  Procedure(s) Performed: CYSTOSCOPY WITH RETROGRADE PYELOGRAM, URETEROSCOPY AND STENT REPLACEMENT (Left ) HOLMIUM LASER APPLICATION (Left )  Patient Location: PACU  Anesthesia Type:General  Level of Consciousness: awake, alert , oriented and patient cooperative  Airway & Oxygen Therapy: Patient Spontanous Breathing and Patient connected to face mask  Post-op Assessment: Report given to RN and Post -op Vital signs reviewed and stable  Post vital signs: Reviewed and stable  Last Vitals:  Vitals Value Taken Time  BP    Temp    Pulse 83 08/30/20 1511  Resp 15 08/30/20 1511  SpO2 99 % 08/30/20 1511  Vitals shown include unvalidated device data.  Last Pain:  Vitals:   08/30/20 1251  TempSrc: Oral  PainSc:          Complications: No complications documented.

## 2020-08-30 NOTE — H&P (Signed)
Urology Admission H&P  Chief Complaint: LEFT Ureteral Stone Manipulation  History of Present Illness:   1 - Left Ureteral Stone - 83mm left proximal stone by CT 05/7590 on eval colicky flank pain. Come bacteruria therefore stented 08/21/20 and palced on augmentin course according to CX.  Today "Whitney Shelton" is seen to proceed with LEFT ureteroscopic stone manipulation. No interval fevers. C19 screen negative. Her recent a1c was 10! She has had an amazingly stressful year.   Past Medical History:  Diagnosis Date  . Asthma    BRONCHIAL ASTHMA   . Complication of anesthesia HARD TO WAKE   slower to arouse   . Diabetes mellitus without complication ([place])    type 2  . Dyspnea    SINCE COVID 10/2019   . Family history of adverse reaction to anesthesia    mother slow to wake up  . Fibroids   . Frequency of urination   . History of kidney stones   . Migraine    HX OF MIGRAINES   . Pneumonia due to COVID-19 virus   . Renal calculus or stone LEFT SIDE M X1  NON-OBSTRUCTIVE  . SUI (stress urinary incontinence, female)   . Unspecified asthma(493.90) HX BRONCHIAL ASTHMA  --- LAST BOUT OF BRONCHITIS LAST WINTER  2012   usually with colds or heavy exercise   Past Surgical History:  Procedure Laterality Date  . CYSTOSCOPY N/A 02/17/2018   Procedure: CYSTOSCOPY;  Surgeon: Bjorn Loser, MD;  Location: [facility];  Service: Urology;  Laterality: N/A;  . CYSTOSCOPY W/ URETERAL STENT PLACEMENT  09/22/2012   Procedure: CYSTOSCOPY WITH RETROGRADE PYELOGRAM/URETERAL STENT PLACEMENT;  Surgeon: Reece Packer, MD;  Location: [facility];  Service: Urology;  Laterality: Left;  . CYSTOSCOPY W/ URETERAL STENT PLACEMENT Left 08/21/2020   Procedure: CYSTOSCOPY WITH RETROGRADE PYELOGRAM/URETERAL STENT PLACEMENT;  Surgeon: Bjorn Loser, MD;  Location: [facility];  Service: Urology;  Laterality: Left;  . CYSTOSCOPY W/ URETERAL STENT REMOVAL  10/05/2012   Procedure: CYSTOSCOPY WITH STENT REMOVAL;  Surgeon: Alexis Frock,  MD;  Location: [facility];  Service: Urology;;  . [place], URETEROSCOPY AND STENT PLACEMENT  10/26/2012   Procedure: CYSTOSCOPY WITH RETROGRADE PYELOGRAM, URETEROSCOPY AND STENT PLACEMENT;  Surgeon: Alexis Frock, MD;  Location: [facility];  Service: Urology;  Laterality: Left;  . CYSTOSCOPY WITH STENT PLACEMENT Left 07/28/2017   Procedure: CYSTOSCOPY RETROGRADE WITH LEFT STENT PLACEMENT;  Surgeon: Franchot Gallo, MD;  Location: [facility];  Service: Urology;  Laterality: Left;  . CYSTOSCOPY/URETEROSCOPY/HOLMIUM LASER/STENT PLACEMENT Left 08/11/2017   Procedure: CYSTOSCOPY/RETROGRADE/URETEROSCOPY/HOLMIUM LASER/STENT EXCHANGE;  Surgeon: Alexis Frock, MD;  Location: [facility];  Service: Urology;  Laterality: Left;  . ENDOMETRIAL ABLATION W/ NOVASURE  04-25-2004  . EYE SURGERY     [facility] eye surgery  . HYSTEROSCOPY WITH RESECTOSCOPE  01-04-2004   RESECTION FIBROID  . PUBOVAGINAL SLING N/A 02/17/2018   Procedure: Gaynelle Arabian;  Surgeon: Bjorn Loser, MD;  Location: [facility];  Service: Urology;  Laterality: N/A;  . ROBOTIC ASSISTED TOTAL HYSTERECTOMY Bilateral 02/17/2018   Procedure: XI ROBOTIC ASSISTED TOTAL HYSTERECTOMY WITH SALPINGECTOMY;  Surgeon: Brien Few, MD;  Location: [facility];  Service: Gynecology;  Laterality: Bilateral;  . URETEROSCOPY  10/05/2012   Procedure: URETEROSCOPY;  Surgeon: Alexis Frock, MD;  Location: [facility];  Service: Urology;  Laterality: Left;  . WISDOM TOOTH EXTRACTION  AGE 61   GEN. ANES.    Home Medications:  No current facility-administered medications for this encounter.  Current Outpatient Medications  Medication Sig Dispense Refill Last Dose  . albuterol (PROVENTIL HFA;VENTOLIN HFA) 108 (90 Base) MCG/ACT inhaler Inhale 1 puff into the lungs every 4 (four) hours as needed for wheezing or shortness of breath.     . allopurinol (ZYLOPRIM) 100 MG tablet Take 100 mg by mouth  at bedtime.   3   . APPLE CIDER VINEGAR PO Take 1 tablet by mouth daily.     Marland Kitchen ascorbic acid (VITAMIN C) 1000 MG tablet Take 1,000 mg by mouth daily.     Marland Kitchen aspirin-acetaminophen-caffeine (EXCEDRIN MIGRAINE) 250-250-65 MG tablet Take 2 tablets by mouth every 6 (six) hours as needed for headache.     Marland Kitchen atorvastatin (LIPITOR) 10 MG tablet Take 1 tablet by mouth at bedtime.   1   . Cholecalciferol 25 MCG (1000 UT) tablet Take 1,000 Units by mouth daily.     Marland Kitchen ELDERBERRY PO Take 1 mg by mouth daily.     . magnesium gluconate (MAGONATE) 500 MG tablet Take 500 mg by mouth daily.     . metFORMIN (GLUCOPHAGE-XR) 500 MG 24 hr tablet Take 2 tablets (1,000 mg total) by mouth daily. 60 tablet 3   . MILK THISTLE PO Take 1 tablet by mouth daily.     Marland Kitchen omega-3 acid ethyl esters (LOVAZA) 1 g capsule Take 1 g by mouth daily.      . Zinc Citrate-Phytase (ZYTAZE) 25-500 MG CAPS Take 1 tablet by mouth daily.      Allergies:  Allergies  Allergen Reactions  . Diazepam Nausea And Vomiting    Other reaction(s): Dizziness COLD SWEATS COLD SWEATS   . Hydrocodone Nausea And Vomiting    Other reaction(s): Dizziness COLD SWEATS COLD SWEATS   . Vicodin [Hydrocodone-Acetaminophen] Nausea Only  . Percocet [Oxycodone-Acetaminophen] Nausea Only    History reviewed. No pertinent family history. Social History:  reports that she has never smoked. She has never used smokeless tobacco. She reports current alcohol use. She reports that she does not use drugs.  Review of Systems  Constitutional: Negative for chills, fatigue and fever.  Genitourinary: Positive for flank pain and urgency.  All other systems reviewed and are negative.   Physical Exam:  Vital signs in last 24 hours:   Physical Exam Vitals reviewed.  Constitutional:      Comments: At baseline  HENT:     Head: Normocephalic.     Nose: Nose normal.  Eyes:     Pupils: Pupils are equal, round, and reactive to light.  Cardiovascular:     Rate and  Rhythm: Normal rate.  Pulmonary:     Effort: Pulmonary effort is normal.  Abdominal:     Comments: Stable truncal obesity.   Genitourinary:    Comments: No CVAT Musculoskeletal:        General: Normal range of motion.     Cervical back: Normal range of motion.  Skin:    General: Skin is warm.  Neurological:     General: No focal deficit present.     Mental Status: She is alert.  Psychiatric:        Mood and Affect: Mood normal.     Laboratory Data:  No results found for this or any previous visit (from the past 24 hour(s)). Recent Results (from the past 240 hour(s))  SARS Coronavirus 2 by RT PCR (hospital order, performed in [facility] hospital lab) Nasopharyngeal Nasopharyngeal Swab     Status: None   Collection Time: 08/21/20 11:47 AM  Specimen: Nasopharyngeal Swab  Result Value Ref Range Status   SARS Coronavirus 2 NEGATIVE NEGATIVE Final    Comment: (NOTE) SARS-CoV-2 target nucleic acids are NOT DETECTED.  The SARS-CoV-2 RNA is generally detectable in upper and lower respiratory specimens during the acute phase of infection. The lowest concentration of SARS-CoV-2 viral copies this assay can detect is 250 copies / mL. A negative result does not preclude SARS-CoV-2 infection and should not be used as the sole basis for treatment or other patient management decisions.  A negative result may occur with improper specimen collection / handling, submission of specimen other than nasopharyngeal swab, presence of viral mutation(s) within the areas targeted by this assay, and inadequate number of viral copies (<250 copies / mL). A negative result must be combined with clinical observations, patient history, and epidemiological information.  Fact Sheet for Patients:   StrictlyIdeas.no  Fact Sheet for Healthcare Providers: BankingDealers.co.za  This test is not yet approved or  cleared by the [place] FDA and has been  authorized for detection and/or diagnosis of SARS-CoV-2 by FDA under an Emergency Use Authorization (EUA).  This EUA will remain in effect (meaning this test can be used) for the duration of the COVID-19 declaration under Section 564(b)(1) of the Act, 21 U.S.C. section 360bbb-3(b)(1), unless the authorization is terminated or revoked sooner.  Performed at [facility], [place] [address], [place], [place] [address]   SARS CORONAVIRUS 2 (TAT 6-24 HRS) Nasopharyngeal Nasopharyngeal Swab     Status: None   Collection Time: 08/28/20 12:21 PM   Specimen: Nasopharyngeal Swab  Result Value Ref Range Status   SARS Coronavirus 2 NEGATIVE NEGATIVE Final    Comment: (NOTE) SARS-CoV-2 target nucleic acids are NOT DETECTED.  The SARS-CoV-2 RNA is generally detectable in upper and lower respiratory specimens during the acute phase of infection. Negative results do not preclude SARS-CoV-2 infection, do not rule out co-infections with other pathogens, and should not be used as the sole basis for treatment or other patient management decisions. Negative results must be combined with clinical observations, patient history, and epidemiological information. The expected result is Negative.  Fact Sheet for Patients: SugarRoll.be  Fact Sheet for Healthcare Providers: https://www.woods-mathews.com/  This test is not yet approved or cleared by the [place] FDA and  has been authorized for detection and/or diagnosis of SARS-CoV-2 by FDA under an Emergency Use Authorization (EUA). This EUA will remain  in effect (meaning this test can be used) for the duration of the COVID-19 declaration under Se ction 564(b)(1) of the Act, 21 U.S.C. section 360bbb-3(b)(1), unless the authorization is terminated or revoked sooner.  Performed at [facility], [place] [address], [place], [place] [address]    Creatinine: Recent Labs    08/28/20 1139   CREATININE 0.74     Plan:   Proceed as planned with LEFT ureteroscopic stone manipulation. Risks, benefits, alternatives, expected peri-op course discussed previously and reiterated today.   We discussed her lab indicators of profound hyperglycemia and STRONG rec of tighter glycemic control to reducen short and long term complications such as peri-op infection. She voiced understanding.    Alexis Frock 08/30/2020, 12:00 PM

## 2020-08-30 NOTE — Anesthesia Preprocedure Evaluation (Addendum)
Anesthesia Evaluation  Patient identified by MRN, date of birth, ID band Patient awake    Reviewed: Allergy & Precautions, NPO status , Patient's Chart, lab work & pertinent test results  History of Anesthesia Complications (+) PROLONGED EMERGENCE  Airway Mallampati: I  TM Distance: >3 FB Neck ROM: Full    Dental no notable dental hx.    Pulmonary shortness of breath, asthma , pneumonia (h/o Covid pneumonia),    Pulmonary exam normal breath sounds clear to auscultation       Cardiovascular Exercise Tolerance: Good negative cardio ROS Normal cardiovascular exam Rhythm:Regular Rate:Normal  EKG from 10/2019 reviewed   Neuro/Psych  Headaches, negative psych ROS   GI/Hepatic negative GI ROS,   Endo/Other  diabetes, Poorly Controlled, Type 2, Oral Hypoglycemic Agentsobesity  Renal/GU Renal disease (kidney stones)  negative genitourinary   Musculoskeletal negative musculoskeletal ROS (+)   Abdominal   Peds negative pediatric ROS (+)  Hematology negative hematology ROS (+)   Anesthesia Other Findings   Reproductive/Obstetrics negative OB ROS                           Anesthesia Physical Anesthesia Plan  ASA: III  Anesthesia Plan: General   Post-op Pain Management:    Induction:   PONV Risk Score and Plan: 3 and Midazolam, Dexamethasone and Ondansetron  Airway Management Planned: LMA  Additional Equipment:   Intra-op Plan:   Post-operative Plan:   Informed Consent: I have reviewed the patients History and Physical, chart, labs and discussed the procedure including the risks, benefits and alternatives for the proposed anesthesia with the patient or authorized representative who has indicated his/her understanding and acceptance.     Dental advisory given  Plan Discussed with:   Anesthesia Plan Comments:

## 2020-08-30 NOTE — Brief Op Note (Signed)
08/30/2020  3:10 PM  PATIENT:  Whitney Shelton  54 y.o. female  PRE-OPERATIVE DIAGNOSIS:  LEFT URETEROPELVIC JUNCTION CALCULUS  POST-OPERATIVE DIAGNOSIS:  LEFT URETEROPELVIC JUNCTION CALCULUS  PROCEDURE:  Procedure(s) with comments: CYSTOSCOPY WITH RETROGRADE PYELOGRAM, URETEROSCOPY AND STENT REPLACEMENT (Left) - 75 MINS HOLMIUM LASER APPLICATION (Left)  SURGEON:  Surgeon(s) and Role:    Alexis Frock, MD - Primary  PHYSICIAN ASSISTANT:   ASSISTANTS: none   ANESTHESIA:   general  EBL:  0 mL   BLOOD ADMINISTERED:none  DRAINS: none   LOCAL MEDICATIONS USED:  NONE  SPECIMEN:  Source of Specimen:  left renal stone fragments  DISPOSITION OF SPECIMEN:  [facility] for compositional analysis  COUNTS:  YES  TOURNIQUET:  * No tourniquets in log *  DICTATION: .Other Dictation: Dictation Number [id number]  PLAN OF CARE: Discharge to home after PACU  PATIENT DISPOSITION:  PACU - hemodynamically stable.   Delay start of Pharmacological VTE agent (>24hrs) due to surgical blood loss or risk of bleeding: not applicable

## 2020-09-03 ENCOUNTER — Encounter (HOSPITAL_COMMUNITY): Payer: Self-pay | Admitting: Urology

## 2020-10-17 ENCOUNTER — Encounter: Payer: Self-pay | Admitting: Family Medicine

## 2020-10-17 ENCOUNTER — Telehealth (INDEPENDENT_AMBULATORY_CARE_PROVIDER_SITE_OTHER): Payer: 59 | Admitting: Family Medicine

## 2020-10-17 ENCOUNTER — Other Ambulatory Visit: Payer: Self-pay

## 2020-10-17 VITALS — Temp 97.2°F | Ht 66.0 in | Wt 220.0 lb

## 2020-10-17 DIAGNOSIS — J4520 Mild intermittent asthma, uncomplicated: Secondary | ICD-10-CM | POA: Diagnosis not present

## 2020-10-17 DIAGNOSIS — R0683 Snoring: Secondary | ICD-10-CM

## 2020-10-17 DIAGNOSIS — Z87442 Personal history of urinary calculi: Secondary | ICD-10-CM

## 2020-10-17 DIAGNOSIS — J4521 Mild intermittent asthma with (acute) exacerbation: Secondary | ICD-10-CM

## 2020-10-17 DIAGNOSIS — Z7689 Persons encountering health services in other specified circumstances: Secondary | ICD-10-CM | POA: Diagnosis not present

## 2020-10-17 DIAGNOSIS — I10 Essential (primary) hypertension: Secondary | ICD-10-CM

## 2020-10-17 DIAGNOSIS — R7989 Other specified abnormal findings of blood chemistry: Secondary | ICD-10-CM

## 2020-10-17 DIAGNOSIS — G4734 Idiopathic sleep related nonobstructive alveolar hypoventilation: Secondary | ICD-10-CM

## 2020-10-17 DIAGNOSIS — E119 Type 2 diabetes mellitus without complications: Secondary | ICD-10-CM | POA: Diagnosis not present

## 2020-10-17 DIAGNOSIS — Z8616 Personal history of COVID-19: Secondary | ICD-10-CM

## 2020-10-17 MED ORDER — ATORVASTATIN CALCIUM 10 MG PO TABS: 10.0000 mg | ORAL_TABLET | Freq: Every day | ORAL | 3 refills | Status: DC

## 2020-10-17 MED ORDER — FREESTYLE LIBRE SENSOR SYSTEM MISC: 0 refills | Status: DC

## 2020-10-17 MED ORDER — BENZONATATE 100 MG PO CAPS: 100.0000 mg | ORAL_CAPSULE | Freq: Three times a day (TID) | ORAL | 1 refills | Status: DC | PRN

## 2020-10-17 MED ORDER — METFORMIN HCL ER 500 MG PO TB24: 1000.0000 mg | ORAL_TABLET | Freq: Every day | ORAL | 3 refills | Status: DC

## 2020-10-17 MED ORDER — OLMESARTAN MEDOXOMIL 20 MG PO TABS: 20.0000 mg | ORAL_TABLET | Freq: Every day | ORAL | 3 refills | Status: DC

## 2020-10-17 MED ORDER — ALBUTEROL SULFATE HFA 108 (90 BASE) MCG/ACT IN AERS: 1.0000 | INHALATION_SPRAY | RESPIRATORY_TRACT | 2 refills | Status: AC | PRN

## 2020-10-17 MED ORDER — METFORMIN HCL ER 500 MG PO TB24: 2000.0000 mg | ORAL_TABLET | Freq: Every day | ORAL | 3 refills | Status: DC

## 2020-10-17 MED ORDER — PREDNISONE 20 MG PO TABS: ORAL_TABLET | ORAL | 0 refills | Status: DC

## 2020-10-17 MED ORDER — GUAIFENESIN-CODEINE 100-10 MG/5ML PO SYRP: 5.0000 mL | ORAL_SOLUTION | Freq: Three times a day (TID) | ORAL | 0 refills | Status: DC | PRN

## 2020-10-17 NOTE — Progress Notes (Signed)
Virtual Visit via Video Note  I connected with Whitney Shelton on 10/17/20 at 10:00 AM EDT by a video enabled telemedicine application and verified that I am speaking with the correct person using two identifiers. Location patient: home Location provider: work  Persons participating in the virtual visit: patient, provider  I discussed the limitations of evaluation and management by telemedicine and the availability of in person appointments. The patient expressed understanding and agreed to proceed.  Chief Complaint  Patient presents with  . Establish Care    NP-establish care to discuss health issues. also having cough/congestion 3 days.,  refills on metformin &  atorvastin     HPI: Whitney Shelton is a 54 y.o. female seen today as a new patient to establish care with our office.  Previous PCP with [facility]. Will need to get records.  She had a PMHx significant for DM, kidney stones, asthma.  She had covid pneumonia in 10/2019.  Pt lost her 55yo son in [place] in 05/2020.   History of kidney stones, last in 08/2020 - Dr. Alexis Frock ([facility])  She needs refills of her metformin and atorvastatin. She has been out of atorvastatin for months. She is taking omega 3 fish oil.  Lab Results  Component Value Date   HGBA1C 10.1 (H) 08/28/2020  9.2 in 10/2019 She is using insulin plant powder in tea.  She was on benicar in the past for HTN but has not been on this x many months.   Pt was put on thyroid medication x 1 mo by previous PCP but this was not continued and pt states she was told her thyroid was fine.   Pt snores but no witnessed apneic episodes. She notes oxygen desaturations while sleeping, wears pulse ox and sat drops in mid 80's at times. She also notes trouble waking up after anesthesia and issue with decreased O2 sat at that time requiring supplemental O2 via [place] in PACU.  Pt has 3 day h/o productive cough and 2 day h/o sinus headache, nasal congestion. Cough is  keeping patient up at night. Only SOB after coughing. No wheeze.   No fever, chills, body aches, GI symptoms.  She is taking robitussin cough syrup and also using nasal saline.  She has mild intermittent asthma and a h/o asthmatic bronchitis. Needs a refill of albuterol inhaler.    Past Medical History:  Diagnosis Date  . Asthma    BRONCHIAL ASTHMA   . Complication of anesthesia HARD TO WAKE   slower to arouse   . Diabetes mellitus without complication ([place])    type 2  . Dyspnea    SINCE COVID 10/2019   . Family history of adverse reaction to anesthesia    mother slow to wake up  . Fibroids   . Frequency of urination   . History of kidney stones   . Migraine    HX OF MIGRAINES   . Pneumonia due to COVID-19 virus   . Renal calculus or stone LEFT SIDE M X1  NON-OBSTRUCTIVE  . SUI (stress urinary incontinence, female)   . Unspecified asthma(493.90) HX BRONCHIAL ASTHMA  --- LAST BOUT OF BRONCHITIS LAST WINTER  2012   usually with colds or heavy exercise    Past Surgical History:  Procedure Laterality Date  . CYSTOSCOPY N/A 02/17/2018   Procedure: CYSTOSCOPY;  Surgeon: Bjorn Loser, MD;  [facility];  Service: Urology;  Laterality: N/A;  . CYSTOSCOPY W/ URETERAL STENT PLACEMENT  09/22/2012  Procedure: CYSTOSCOPY WITH RETROGRADE PYELOGRAM/URETERAL STENT PLACEMENT;  Surgeon: Reece Packer, MD;  [facility];  Service: Urology;  Laterality: Left;  . CYSTOSCOPY W/ URETERAL STENT PLACEMENT Left 08/21/2020   Procedure: CYSTOSCOPY WITH RETROGRADE PYELOGRAM/URETERAL STENT PLACEMENT;  Surgeon: Bjorn Loser, MD;  [facility];  Service: Urology;  Laterality: Left;  . CYSTOSCOPY W/ URETERAL STENT REMOVAL  10/05/2012   Procedure: CYSTOSCOPY WITH STENT REMOVAL;  Surgeon: Alexis Frock, MD;  Location: [facility];  Service: Urology;;  . [place], URETEROSCOPY AND STENT PLACEMENT  10/26/2012   Procedure: CYSTOSCOPY WITH  RETROGRADE PYELOGRAM, URETEROSCOPY AND STENT PLACEMENT;  Surgeon: Alexis Frock, MD;  Location: [facility];  Service: Urology;  Laterality: Left;  . CYSTOSCOPY WITH RETROGRADE PYELOGRAM, URETEROSCOPY AND STENT PLACEMENT Left 08/30/2020   Procedure: CYSTOSCOPY WITH RETROGRADE PYELOGRAM, URETEROSCOPY AND STENT REPLACEMENT;  Surgeon: Alexis Frock, MD;  [facility];  Service: Urology;  Laterality: Left;  75 MINS  . CYSTOSCOPY WITH STENT PLACEMENT Left 07/28/2017   Procedure: CYSTOSCOPY RETROGRADE WITH LEFT STENT PLACEMENT;  Surgeon: Franchot Gallo, MD;  [facility];  Service: Urology;  Laterality: Left;  . CYSTOSCOPY/URETEROSCOPY/HOLMIUM LASER/STENT PLACEMENT Left 08/11/2017   Procedure: CYSTOSCOPY/RETROGRADE/URETEROSCOPY/HOLMIUM LASER/STENT EXCHANGE;  Surgeon: Alexis Frock, MD;  [facility];  Service: Urology;  Laterality: Left;  . ENDOMETRIAL ABLATION W/ NOVASURE  04-25-2004  . eye lid lift Bilateral   . EYE SURGERY     lasix eye surgery  . HOLMIUM LASER APPLICATION Left 02/11/9797   Procedure: HOLMIUM LASER APPLICATION;  Surgeon: Alexis Frock, MD;  [facility];  Service: Urology;  Laterality: Left;  . HYSTEROSCOPY WITH RESECTOSCOPE  01-04-2004   RESECTION FIBROID  . KIDNEY STONE SURGERY    . PUBOVAGINAL SLING N/A 02/17/2018   Procedure: Gaynelle Arabian;  Surgeon: Bjorn Loser, MD;  [facility];  Service: Urology;  Laterality: N/A;  . ROBOTIC ASSISTED TOTAL HYSTERECTOMY Bilateral 02/17/2018   Procedure: XI ROBOTIC ASSISTED TOTAL HYSTERECTOMY WITH SALPINGECTOMY;  Surgeon: Brien Few, MD;  [facility];  Service: Gynecology;  Laterality: Bilateral;  . URETEROSCOPY  10/05/2012   Procedure: URETEROSCOPY;  Surgeon: Alexis Frock, MD;  Location: [facility];  Service: Urology;  Laterality: Left;  . WISDOM TOOTH EXTRACTION  AGE 8   GEN. ANES.    Family History  Problem Relation Age of Onset  . Hyperlipidemia Father    . Hypertension Father   . Diabetes Paternal Aunt     Social History   Tobacco Use  . Smoking status: Never Smoker  . Smokeless tobacco: Never Used  Vaping Use  . Vaping Use: Never used  Substance Use Topics  . Alcohol use: Yes    Comment: rarely   . Drug use: No     Current Outpatient Medications:  .  allopurinol (ZYLOPRIM) 100 MG tablet, Take 100 mg by mouth at bedtime. , Disp: , Rfl: 3 .  APPLE CIDER VINEGAR PO, Take 1 tablet by mouth daily., Disp: , Rfl:  .  ascorbic acid (VITAMIN C) 1000 MG tablet, Take 1,000 mg by mouth daily., Disp: , Rfl:  .  atorvastatin (LIPITOR) 10 MG tablet, Take 1 tablet (10 mg total) by mouth at bedtime., Disp: 90 tablet, Rfl: 3 .  Cholecalciferol 25 MCG (1000 UT) tablet, Take 1,000 Units by mouth daily., Disp: , Rfl:  .  ELDERBERRY PO, Take 1 mg by mouth daily., Disp: , Rfl:  .  magnesium gluconate (MAGONATE) 500 MG tablet, Take  500 mg by mouth daily., Disp: , Rfl:  .  metFORMIN (GLUCOPHAGE-XR) 500 MG 24 hr tablet, Take 2 tablets (1,000 mg total) by mouth daily., Disp: 180 tablet, Rfl: 3 .  MILK THISTLE PO, Take 1 tablet by mouth daily., Disp: , Rfl:  .  omega-3 acid ethyl esters (LOVAZA) 1 g capsule, Take 1 g by mouth daily. , Disp: , Rfl:  .  Zinc Citrate-Phytase (ZYTAZE) 25-500 MG CAPS, Take 1 tablet by mouth daily., Disp: , Rfl:  .  albuterol (VENTOLIN HFA) 108 (90 Base) MCG/ACT inhaler, Inhale 1 puff into the lungs every 4 (four) hours as needed for wheezing or shortness of breath., Disp: 8 g, Rfl: 2  Allergies  Allergen Reactions  . Diazepam Nausea And Vomiting    Other reaction(s): Dizziness COLD SWEATS COLD SWEATS   . Hydrocodone Nausea And Vomiting    Other reaction(s): Dizziness COLD SWEATS COLD SWEATS   . Vicodin [Hydrocodone-Acetaminophen] Nausea Only  . Percocet [Oxycodone-Acetaminophen] Nausea Only      ROS: See pertinent positives and negatives per HPI.   EXAM:  VITALS per patient if applicable: Temp (!) 93.7 F  (36.2 C) Comment: pt reported  Ht 5\' 6"  (1.676 m)   Wt 220 lb (99.8 kg) Comment: pt reported  LMP  (LMP Unknown)   BMI 35.51 kg/m    GENERAL: alert, oriented, in no acute distress, but frequent, deep cough  HEENT: atraumatic, conjunctiva clear, no obvious abnormalities on inspection of external nose and ears  NECK: normal movements of the head and neck  LUNGS: on inspection no signs of respiratory distress, breathing rate appears normal, no obvious gross SOB, gasping or wheezing, no conversational dyspnea; pt coughs frequently   CV: no obvious cyanosis  MS: moves all visible extremities without noticeable abnormality  PSYCH/NEURO: pleasant and cooperative, no obvious depression or anxiety, speech and thought processing grossly intact   ASSESSMENT AND PLAN: 1. Encounter to establish care with new doctor  2. Type 2 diabetes mellitus without complication, without long-term current use of insulin (HCC) - uncontrolled, last A1C = 10.1, previously 9.2 Refill: - atorvastatin (LIPITOR) 10 MG tablet; Take 1 tablet (10 mg total) by mouth at bedtime.  Dispense: 90 tablet; Refill: 3 Refill: - olmesartan (BENICAR) 20 MG tablet; Take 1 tablet (20 mg total) by mouth daily.  Dispense: 90 tablet; Refill: 3 Rx: - Continuous Blood Gluc Sensor ([place]) MISC; Use/wear to monitor blood sugar  Dispense: 1 each; Refill: 0 Increase: - metFORMIN (GLUCOPHAGE-XR) 500 MG 24 hr tablet; Take 4 tablets (2,000 mg total) by mouth daily with breakfast.  Dispense: 360 tablet; Refill: 3 - increased from 1000mg  daily - Hemoglobin A1c; Future - Lipid panel; Future - Comprehensive metabolic panel; Future - Microalbumin / creatinine urine ratio; Future - q37mo f/u  3. Personal history of kidney stones - follows with urology Dr. Tresa Moore ([facility]), last stone in 08/2020  4. Mild intermittent asthma without complication - pt notes usually 1x/year having asthmatic bronchitis Refill: -  albuterol (VENTOLIN HFA) 108 (90 Base) MCG/ACT inhaler; Inhale 1 puff into the lungs every 4 (four) hours as needed for wheezing or shortness of breath.  Dispense: 8 g; Refill: 2  5. Elevated TSH - TSH; Future - T4, free; Future  6. Personal history of COVID-19 - 10/2019  7. Snoring 8. Nocturnal oxygen desaturation - Ambulatory referral to Pulmonology  9. Primary hypertension - off med x months Refill: - olmesartan (BENICAR) 20 MG tablet; Take 1 tablet (20 mg  total) by mouth daily.  Dispense: 90 tablet; Refill: 3  10. Mild intermittent asthmatic bronchitis with acute exacerbation - supportive care - increase fluids, cont nasal saline spray - albuterol inhaler PRN Rx: - guaiFENesin-codeine (ROBITUSSIN AC) 100-10 MG/5ML syrup; Take 5 mLs by mouth 3 (three) times daily as needed for cough.  Dispense: 120 mL; Refill: 0 - benzonatate (TESSALON) 100 MG capsule; Take 1 capsule (100 mg total) by mouth 3 (three) times daily as needed for cough.  Dispense: 30 capsule; Refill: 1 - predniSONE (DELTASONE) 20 MG tablet; 3 tab po x 3 days, 2 tab po x 3 days, 1 tab po x 3 days, 1/2 tab po x 3 days  Dispense: 20 tablet; Refill: 0 - f/u if symptoms worsen or do not start to improve in 1 week Discussed plan and reviewed medications with patient, including risks, benefits, and potential side effects. Pt expressed understand. All questions answered.     I discussed the assessment and treatment plan with the patient. The patient was provided an opportunity to ask questions and all were answered. The patient agreed with the plan and demonstrated an understanding of the instructions.   The patient was advised to call back or seek an in-person evaluation if the symptoms worsen or if the condition fails to improve as anticipated.   Letta Median, DO

## 2020-10-21 ENCOUNTER — Telehealth: Payer: Self-pay | Admitting: Family Medicine

## 2020-10-21 ENCOUNTER — Telehealth: Payer: Self-pay

## 2020-10-21 DIAGNOSIS — E119 Type 2 diabetes mellitus without complications: Secondary | ICD-10-CM

## 2020-10-21 MED ORDER — AZITHROMYCIN 250 MG PO TABS: 250.0000 mg | ORAL_TABLET | Freq: Every day | ORAL | 0 refills | Status: DC

## 2020-10-21 MED ORDER — BLOOD GLUCOSE MONITORING SUPPL DEVI: 0 refills | Status: AC

## 2020-10-21 NOTE — Telephone Encounter (Signed)
Spoke with pt and sent message to Doc of the day.

## 2020-10-21 NOTE — Addendum Note (Signed)
Addended by: Darral Dash on: 10/21/2020 01:20 PM   Modules accepted: Orders

## 2020-10-21 NOTE — Telephone Encounter (Signed)
Patient was told to give the office a call back if she was not feeling any better and she was also told she could get an antibiotic sent in if she wasn't feeling better. She said that she gave incorrect pharmacy information, she uses [facility] [address]. She also has questions regarding the medications that were sent in at her last visit.  Please give her a call back at [phone] and advise.

## 2020-10-21 NOTE — Telephone Encounter (Signed)
Azithromycin sent

## 2020-10-21 NOTE — Telephone Encounter (Signed)
I spoke with pt ans she said that she is not feeling any better after since she has been taking medication prescribed on Thursday.  Pt c/o cough w/greenish phlem, stuffy nose.  Pt was told to call office if symptoms not better or if worsening. Pt also asking for a rx for the [facility] [place] monitor to be sent in, as the sensor was already sent in. Please see message and advise.  Thank you.

## 2020-11-01 ENCOUNTER — Other Ambulatory Visit: Payer: Self-pay | Admitting: Family Medicine

## 2020-11-01 DIAGNOSIS — E119 Type 2 diabetes mellitus without complications: Secondary | ICD-10-CM

## 2020-11-01 NOTE — Telephone Encounter (Signed)
Last fill 10/17/20

## 2020-11-18 ENCOUNTER — Telehealth: Payer: Self-pay | Admitting: Family Medicine

## 2020-11-18 NOTE — Telephone Encounter (Signed)
Patient is calling to see where she can get the J&J Booster. She also wants to know if she should get it. Please give her a call back at [phone] and advise.

## 2020-11-18 NOTE — Telephone Encounter (Signed)
spoke to patient & advised that as long as it has been 6 months that it is recommended to get a booster Covid vaccine and she can get that at any pharmacy.  Pt agreeable. Dm/cma

## 2020-11-20 ENCOUNTER — Other Ambulatory Visit: Payer: Self-pay

## 2020-11-21 ENCOUNTER — Other Ambulatory Visit (INDEPENDENT_AMBULATORY_CARE_PROVIDER_SITE_OTHER): Payer: 59

## 2020-11-21 DIAGNOSIS — E119 Type 2 diabetes mellitus without complications: Secondary | ICD-10-CM | POA: Diagnosis not present

## 2020-11-21 DIAGNOSIS — R7989 Other specified abnormal findings of blood chemistry: Secondary | ICD-10-CM

## 2020-11-21 LAB — LIPID PANEL
Cholesterol: 187 mg/dL (ref 0–200)
HDL: 37.5 mg/dL — ABNORMAL LOW (ref >39.00)
Total CHOL/HDL Ratio: 5
Triglycerides: 489 mg/dL — ABNORMAL HIGH (ref 0.0–149.0)

## 2020-11-21 LAB — HEMOGLOBIN A1C: Hgb A1c MFr Bld: 9.2 % — ABNORMAL HIGH (ref 4.6–6.5)

## 2020-11-21 LAB — MICROALBUMIN / CREATININE URINE RATIO
Creatinine,U: 116.1 mg/dL
Microalb Creat Ratio: 7.6 mg/g (ref 0.0–30.0)
Microalb, Ur: 8.8 mg/dL — ABNORMAL HIGH (ref 0.0–1.9)

## 2020-11-21 LAB — COMPREHENSIVE METABOLIC PANEL
ALT: 23 U/L (ref 0–35)
AST: 17 U/L (ref 0–37)
Albumin: 4.9 g/dL (ref 3.5–5.2)
Alkaline Phosphatase: 80 U/L (ref 39–117)
BUN: 19 mg/dL (ref 6–23)
CO2: 25 mEq/L (ref 19–32)
Calcium: 10 mg/dL (ref 8.4–10.5)
Chloride: 100 mEq/L (ref 96–112)
Creatinine, Ser: 0.91 mg/dL (ref 0.40–1.20)
GFR: 71.41 mL/min (ref >60.00)
Glucose, Bld: 160 mg/dL — ABNORMAL HIGH (ref 70–99)
Potassium: 4.5 mEq/L (ref 3.5–5.1)
Sodium: 137 mEq/L (ref 135–145)
Total Bilirubin: 0.5 mg/dL (ref 0.2–1.2)
Total Protein: 7.3 g/dL (ref 6.0–8.3)

## 2020-11-21 LAB — TSH: TSH: 7.45 u[IU]/mL — ABNORMAL HIGH (ref 0.35–4.50)

## 2020-11-21 LAB — LDL CHOLESTEROL, DIRECT: Direct LDL: 81 mg/dL

## 2020-11-21 LAB — T4, FREE: Free T4: 0.73 ng/dL (ref 0.60–1.60)

## 2020-11-22 ENCOUNTER — Encounter: Payer: Self-pay | Admitting: Family Medicine

## 2020-11-22 DIAGNOSIS — E781 Pure hyperglyceridemia: Secondary | ICD-10-CM

## 2020-11-22 DIAGNOSIS — E039 Hypothyroidism, unspecified: Secondary | ICD-10-CM

## 2020-11-22 DIAGNOSIS — E119 Type 2 diabetes mellitus without complications: Secondary | ICD-10-CM

## 2020-11-28 NOTE — Progress Notes (Signed)
11/29/20- 54 yoF never smoker for sleep evaluation courtesy of Dr Letta Median with concern of snoring and nocturnal hypoxemia. Medical problem list includes Migraine, Covid pneumonia November, 2020, Respiratory Failure with hypoxia, DM2, Kidney Stones, HTN, Asthma, Hyperlipidemia,  Epworth score- 6 Body weight today- covid vax- J&J, Phizer x1 Flu vax- had ------Patient is here because she had Covid and kidney stone 2 months ago and was hard to wake up from anesthesia.  26 yo son died in [place] 6 months> tearful. Family told her on one check that her O2 sat dropped at night. Aware of some DOE. Does snore.  Asthma as child and teen. Still has inhaler, occasionally used with little cough or wheeze. Finished Zpak for sinusitis 2 weeks ago- effective.  Chest feels "fine" now. Had post-Covid CXR's and PFT at [facility]. She was told she was "good to go" with no detailed expalnations CXR 11/05/19 1V- IMPRESSION: 1. Bilateral, predominantly peripheral heterogeneous airspace opacity, consistent with multifocal infection. 2.  Cardiomegaly.  Prior to Admission medications   Medication Sig Start Date End Date Taking? Authorizing Provider  albuterol (VENTOLIN HFA) 108 (90 Base) MCG/ACT inhaler Inhale 1 puff into the lungs every 4 (four) hours as needed for wheezing or shortness of breath. 10/17/20  Yes Cirigliano, Mary K, DO  allopurinol (ZYLOPRIM) 100 MG tablet Take 100 mg by mouth at bedtime.  01/23/18  Yes [provider]  APPLE CIDER VINEGAR PO Take 1 tablet by mouth daily.   Yes [provider]  ascorbic acid (VITAMIN C) 1000 MG tablet Take 1,000 mg by mouth daily.   Yes [provider]  atorvastatin (LIPITOR) 10 MG tablet Take 1 tablet (10 mg total) by mouth at bedtime. 10/17/20  Yes Cirigliano, Mary K, DO  azithromycin (ZITHROMAX Z-PAK) 250 MG tablet Take 1 tablet (250 mg total) by mouth daily. Take 2tabs on first day, then 1tab once a day till complete 10/21/20  Yes Nche,  Charlene Brooke, NP  benzonatate (TESSALON) 100 MG capsule Take 1 capsule (100 mg total) by mouth 3 (three) times daily as needed for cough. 10/17/20  Yes Cirigliano, Mary K, DO  Blood Glucose Monitoring Suppl DEVI Use to check blood sugar as directed. 10/21/20  Yes Cirigliano, Garvin Fila, DO  Cholecalciferol 25 MCG (1000 UT) tablet Take 1,000 Units by mouth daily.   Yes [provider]  Continuous Blood Gluc Sensor (FREESTYLE LIBRE 14 DAY SENSOR) MISC USE/WEAR TO MONITOR BLOOD SUGAR 11/01/20  Yes Cirigliano, Mary K, DO  ELDERBERRY PO Take 1 mg by mouth daily.   Yes [provider]  guaiFENesin-codeine (ROBITUSSIN AC) 100-10 MG/5ML syrup Take 5 mLs by mouth 3 (three) times daily as needed for cough. 10/17/20  Yes Cirigliano, Mary K, DO  magnesium gluconate (MAGONATE) 500 MG tablet Take 500 mg by mouth daily.   Yes [provider]  metFORMIN (GLUCOPHAGE-XR) 500 MG 24 hr tablet Take 4 tablets (2,000 mg total) by mouth daily with breakfast. 10/17/20  Yes Cirigliano, Mary K, DO  MILK THISTLE PO Take 1 tablet by mouth daily.   Yes [provider]  olmesartan (BENICAR) 20 MG tablet Take 1 tablet (20 mg total) by mouth daily. 10/17/20  Yes Cirigliano, Mary K, DO  omega-3 acid ethyl esters (LOVAZA) 1 g capsule Take 1 g by mouth daily.    Yes [provider]  predniSONE (DELTASONE) 20 MG tablet 3 tab po x 3 days, 2 tab po x 3 days, 1 tab po x 3 days, 1/2 tab  po x 3 days 10/17/20  Yes Cirigliano, Mary K, DO  Zinc Citrate-Phytase (ZYTAZE) 25-500 MG CAPS Take 1 tablet by mouth daily.   Yes [provider]   Past Medical History:  Diagnosis Date  . Asthma    BRONCHIAL ASTHMA   . Complication of anesthesia HARD TO WAKE   slower to arouse   . Diabetes mellitus without complication ([place])    type 2  . Dyspnea    SINCE COVID 10/2019   . Family history of adverse reaction to anesthesia    mother slow to wake up  . Fibroids   . Frequency of urination   .  History of kidney stones   . Migraine    HX OF MIGRAINES   . Pneumonia due to COVID-19 virus   . Renal calculus or stone LEFT SIDE M X1  NON-OBSTRUCTIVE  . SUI (stress urinary incontinence, female)   . Unspecified asthma(493.90) HX BRONCHIAL ASTHMA  --- LAST BOUT OF BRONCHITIS LAST WINTER  2012   usually with colds or heavy exercise   Past Surgical History:  Procedure Laterality Date  . CYSTOSCOPY N/A 02/17/2018   Procedure: CYSTOSCOPY;  Surgeon: Bjorn Loser, MD;  [facility];  Service: Urology;  Laterality: N/A;  . CYSTOSCOPY W/ URETERAL STENT PLACEMENT  09/22/2012   Procedure: CYSTOSCOPY WITH RETROGRADE PYELOGRAM/URETERAL STENT PLACEMENT;  Surgeon: Reece Packer, MD;  [facility];  Service: Urology;  Laterality: Left;  . CYSTOSCOPY W/ URETERAL STENT PLACEMENT Left 08/21/2020   Procedure: CYSTOSCOPY WITH RETROGRADE PYELOGRAM/URETERAL STENT PLACEMENT;  Surgeon: Bjorn Loser, MD;  [facility];  Service: Urology;  Laterality: Left;  . CYSTOSCOPY W/ URETERAL STENT REMOVAL  10/05/2012   Procedure: CYSTOSCOPY WITH STENT REMOVAL;  Surgeon: Alexis Frock, MD;  Location: [facility];  Service: Urology;;  . [place], URETEROSCOPY AND STENT PLACEMENT  10/26/2012   Procedure: CYSTOSCOPY WITH RETROGRADE PYELOGRAM, URETEROSCOPY AND STENT PLACEMENT;  Surgeon: Alexis Frock, MD;  Location: [facility];  Service: Urology;  Laterality: Left;  . CYSTOSCOPY WITH RETROGRADE PYELOGRAM, URETEROSCOPY AND STENT PLACEMENT Left 08/30/2020   Procedure: CYSTOSCOPY WITH RETROGRADE PYELOGRAM, URETEROSCOPY AND STENT REPLACEMENT;  Surgeon: Alexis Frock, MD;  [facility];  Service: Urology;  Laterality: Left;  75 MINS  . CYSTOSCOPY WITH STENT PLACEMENT Left 07/28/2017   Procedure: CYSTOSCOPY RETROGRADE WITH LEFT STENT PLACEMENT;  Surgeon: Franchot Gallo, MD;  [facility];  Service: Urology;  Laterality: Left;  .  CYSTOSCOPY/URETEROSCOPY/HOLMIUM LASER/STENT PLACEMENT Left 08/11/2017   Procedure: CYSTOSCOPY/RETROGRADE/URETEROSCOPY/HOLMIUM LASER/STENT EXCHANGE;  Surgeon: Alexis Frock, MD;  [facility];  Service: Urology;  Laterality: Left;  . ENDOMETRIAL ABLATION W/ NOVASURE  04-25-2004  . eye lid lift Bilateral   . EYE SURGERY     lasix eye surgery  . HOLMIUM LASER APPLICATION Left 08/18/5620   Procedure: HOLMIUM LASER APPLICATION;  Surgeon: Alexis Frock, MD;  [facility];  Service: Urology;  Laterality: Left;  . HYSTEROSCOPY WITH RESECTOSCOPE  01-04-2004   RESECTION FIBROID  . KIDNEY STONE SURGERY    . PUBOVAGINAL SLING N/A 02/17/2018   Procedure: Gaynelle Arabian;  Surgeon: Bjorn Loser, MD;  [facility];  Service: Urology;  Laterality: N/A;  . ROBOTIC ASSISTED TOTAL HYSTERECTOMY Bilateral 02/17/2018   Procedure: XI ROBOTIC ASSISTED TOTAL HYSTERECTOMY WITH SALPINGECTOMY;  Surgeon: Brien Few, MD;  [facility];  Service: Gynecology;  Laterality: Bilateral;  . URETEROSCOPY  10/05/2012   Procedure: URETEROSCOPY;  Surgeon: Alexis Frock, MD;  Location:  [facility];  Service: Urology;  Laterality: Left;  . WISDOM TOOTH EXTRACTION  AGE 41   GEN. ANES.   Family History  Problem Relation Age of Onset  . Hyperlipidemia Father   . Hypertension Father   . Diabetes Paternal Aunt    Social History   Socioeconomic History  . Marital status: Married    Spouse name: Not on file  . Number of children: 2  . Years of education: 30  . Highest education level: Not on file  Occupational History  . Not on file  Tobacco Use  . Smoking status: Never Smoker  . Smokeless tobacco: Never Used  Vaping Use  . Vaping Use: Never used  Substance and Sexual Activity  . Alcohol use: Yes    Comment: rarely   . Drug use: No  . Sexual activity: Not Currently  Other Topics Concern  . Not on file  Social History Narrative  . Not on file   Social Determinants of  Health   Financial Resource Strain: Not on file  Food Insecurity: Not on file  Transportation Needs: Not on file  Physical Activity: Not on file  Stress: Not on file  Social Connections: Not on file  Intimate Partner Violence: Not on file   ROS-see HPI   + = positive Constitutional:    weight loss, night sweats, fevers, chills, fatigue, lassitude. HEENT:    headaches, difficulty swallowing, tooth/dental problems, sore throat,       sneezing, itching, ear ache, nasal congestion, post nasal drip, snoring CV:    chest pain, orthopnea, PND, swelling in lower extremities, anasarca,                                  dizziness, palpitations Resp:   +shortness of breath with exertion or at rest.                productive cough,   non-productive cough, coughing up of blood.              change in color of mucus.  wheezing.   Skin:    rash or lesions. GI:  No-   heartburn, indigestion, abdominal pain, nausea, vomiting, diarrhea,                 change in bowel habits, loss of appetite GU: dysuria, change in color of urine, no urgency or frequency.   flank pain. MS:   joint pain, stiffness, decreased range of motion, back pain. Neuro-     nothing unusual Psych:  change in mood or affect.  depression or anxiety.   memory loss.  OBJ- Physical Exam General- Alert, Oriented, Affect-appropriate, Distress- none acute, + overweight Skin- rash-none, lesions- none, excoriation- none Lymphadenopathy- none Head- atraumatic            Eyes- Gross vision intact, PERRLA, conjunctivae and secretions clear            Ears- Hearing, canals-normal            Nose- Clear, no-Septal dev, mucus, polyps, erosion, perforation             Throat- Mallampati II , mucosa clear , drainage- none, tonsils- atrophic, + teeth Neck- flexible , trachea midline, no stridor , thyroid nl, carotid no bruit Chest - symmetrical excursion , unlabored           Heart/CV- RRR , no murmur , no gallop  ,  no rub, nl s1 s2                            - JVD- none , edema- none, stasis changes- none, varices- none           Lung- clear to P&A, wheeze- none, cough- none , dullness-none, rub- none           Chest wall-  Abd-  Br/ Gen/ Rectal- Not done, not indicated Extrem- cyanosis- none, clubbing, none, atrophy- none, strength- nl Neuro- grossly intact to observation

## 2020-11-29 ENCOUNTER — Other Ambulatory Visit: Payer: Self-pay

## 2020-11-29 ENCOUNTER — Encounter: Payer: Self-pay | Admitting: Internal Medicine

## 2020-11-29 ENCOUNTER — Ambulatory Visit (INDEPENDENT_AMBULATORY_CARE_PROVIDER_SITE_OTHER): Payer: 59 | Admitting: Internal Medicine

## 2020-11-29 VITALS — BP 138/88 | HR 77 | Temp 97.3°F | Ht 66.0 in | Wt 219.8 lb

## 2020-11-29 DIAGNOSIS — R0683 Snoring: Secondary | ICD-10-CM

## 2020-11-29 DIAGNOSIS — U071 COVID-19: Secondary | ICD-10-CM | POA: Diagnosis not present

## 2020-11-29 DIAGNOSIS — J1282 Pneumonia due to coronavirus disease 2019: Secondary | ICD-10-CM

## 2020-11-29 DIAGNOSIS — J4520 Mild intermittent asthma, uncomplicated: Secondary | ICD-10-CM | POA: Diagnosis not present

## 2020-11-29 NOTE — Patient Instructions (Signed)
Order- schedule  Home sleep test    Dx snorng  Please call us for results and recommendations about 2 weeks after your sleep study.

## 2020-12-01 ENCOUNTER — Encounter: Payer: Self-pay | Admitting: Internal Medicine

## 2020-12-01 DIAGNOSIS — R0683 Snoring: Secondary | ICD-10-CM | POA: Insufficient documentation

## 2020-12-01 DIAGNOSIS — J4520 Mild intermittent asthma, uncomplicated: Secondary | ICD-10-CM | POA: Insufficient documentation

## 2020-12-01 NOTE — Assessment & Plan Note (Addendum)
She thinks she may be short of breath with exertion after Covid, but otherwise  Chest feels "fine". She says she was tole at [facility] after CXRs and PFT that she was "good to go", with no f/u planned.  Plan- we will watch this issue for now. Encourage walking for stamina.

## 2020-12-01 NOTE — Assessment & Plan Note (Signed)
Currently uncomplicated. She has a rescue inhaler used very occasionally. Plan- observation

## 2020-12-01 NOTE — Assessment & Plan Note (Signed)
Question of sleep disordered breathing, including a single check by family with oximeter while asleep, telling her sat dropped into 80's.  Plan- sleep study

## 2020-12-04 MED ORDER — PIOGLITAZONE HCL 30 MG PO TABS: 30.0000 mg | ORAL_TABLET | Freq: Every day | ORAL | 3 refills | Status: DC

## 2020-12-04 MED ORDER — LEVOTHYROXINE SODIUM 25 MCG PO TABS: 25.0000 ug | ORAL_TABLET | Freq: Every day | ORAL | 0 refills | Status: DC

## 2020-12-04 MED ORDER — FENOFIBRATE 145 MG PO TABS: 145.0000 mg | ORAL_TABLET | Freq: Every day | ORAL | 3 refills | Status: DC

## 2021-01-07 ENCOUNTER — Ambulatory Visit: Payer: 59

## 2021-01-07 ENCOUNTER — Other Ambulatory Visit: Payer: Self-pay

## 2021-01-07 DIAGNOSIS — R0683 Snoring: Secondary | ICD-10-CM

## 2021-01-07 DIAGNOSIS — G4733 Obstructive sleep apnea (adult) (pediatric): Secondary | ICD-10-CM | POA: Diagnosis not present

## 2021-01-09 DIAGNOSIS — G4733 Obstructive sleep apnea (adult) (pediatric): Secondary | ICD-10-CM

## 2021-02-27 ENCOUNTER — Ambulatory Visit: Payer: 59 | Admitting: Internal Medicine

## 2021-03-01 ENCOUNTER — Other Ambulatory Visit: Payer: Self-pay | Admitting: Family Medicine

## 2021-03-01 DIAGNOSIS — E039 Hypothyroidism, unspecified: Secondary | ICD-10-CM

## 2021-03-03 NOTE — Telephone Encounter (Signed)
Last VV 10/17/20 Last fill 12/04/20  #90/0

## 2021-03-04 ENCOUNTER — Encounter: Payer: Self-pay | Admitting: Family Medicine

## 2021-03-04 ENCOUNTER — Telehealth: Payer: Self-pay | Admitting: Family Medicine

## 2021-03-04 DIAGNOSIS — E119 Type 2 diabetes mellitus without complications: Secondary | ICD-10-CM

## 2021-03-04 MED ORDER — FREESTYLE LIBRE 14 DAY READER DEVI: 0 refills | Status: AC

## 2021-03-04 NOTE — Telephone Encounter (Signed)
Patient wants to up date her pharmacy information to:  [facility] in [place]   Address: [address], [place] [address] Ph: [phone]  Patient is also scheduled for 04/27, but wants to know if she needs to be seen sooner. She also mentioned that her [employer] Glucose Testing monitor is not working. Please advise.

## 2021-03-04 NOTE — Telephone Encounter (Signed)
Rx sent today, to requested pharmacy.

## 2021-03-05 MED ORDER — FREESTYLE LIBRE 14 DAY SENSOR MISC
3 refills | Status: AC
Start: 2021-03-05 — End: ?

## 2021-03-10 NOTE — Progress Notes (Signed)
11/29/20- 65 yoF never smoker for sleep evaluation courtesy of Dr Letta Median with concern of snoring and nocturnal hypoxemia. Medical problem list includes Migraine, Covid pneumonia November, 2020, Respiratory Failure with hypoxia, DM2, Kidney Stones, HTN, Asthma, Hyperlipidemia,  Epworth score- 6 Body weight today- covid vax- J&J, Phizer x1 Flu vax- had ------Patient is here because she had Covid and kidney stone 2 months ago and was hard to wake up from anesthesia.  59 yo son died in [place] 6 months> tearful. Family told her on one check that her O2 sat dropped at night. Aware of some DOE. Does snore.  Asthma as child and teen. Still has inhaler, occasionally used with little cough or wheeze. Finished Zpak for sinusitis 2 weeks ago- effective.  Chest feels "fine" now. Had post-Covid CXR's and PFT at [facility]. She was told she was "good to go" with no detailed expalnations CXR 11/05/19 1V- IMPRESSION: 1. Bilateral, predominantly peripheral heterogeneous airspace opacity, consistent with multifocal infection. 2.  Cardiomegaly.   03/11/21- 85 yoF never smoker for sleep evaluation of snoring and nocturnal hypoxemia. Complicated by Migraine, Covid pneumonia November, 2020, Respiratory Failure with hypoxia, DM2, Kidney Stones, HTN, Asthma, Hyperlipidemia, Hypothyroid,  - Ventolin hfa,  HST 01/08/21- AHI 6/ hr, desaturation to 80%, average 92%, body weight 219 lbs TSH elevated 11/21/20- being repeated. On Synthroid Body weight today- Covid vax-J&J, 1 Phizer Flu vax-had -----Patient states that she still has some shortness of breath with exertion, finds her self at times gasping for air. Denies cough Recently moved to [place] and daughter has noted desaturation with home oximeter. Discussed confirmation with formal overnight oximetry since OSA is minimal.  ROS-see HPI   + = positive Constitutional:    weight loss, night sweats, fevers, chills, fatigue, lassitude. HEENT:     headaches, difficulty swallowing, tooth/dental problems, sore throat,       sneezing, itching, ear ache, nasal congestion, post nasal drip, snoring CV:    chest pain, orthopnea, PND, swelling in lower extremities, anasarca,                                   dizziness, palpitations Resp:   +shortness of breath with exertion or at rest.                productive cough,   non-productive cough, coughing up of blood.              change in color of mucus.  wheezing.   Skin:    rash or lesions. GI:  No-   heartburn, indigestion, abdominal pain, nausea, vomiting, diarrhea,                 change in bowel habits, loss of appetite GU: dysuria, change in color of urine, no urgency or frequency.   flank pain. MS:   joint pain, stiffness, decreased range of motion, back pain. Neuro-     nothing unusual Psych:  change in mood or affect.  depression or anxiety.   memory loss.  OBJ- Physical Exam General- Alert, Oriented, Affect-appropriate, Distress- none acute, + overweight Skin- rash-none, lesions- none, excoriation- none Lymphadenopathy- none Head- atraumatic            Eyes- Gross vision intact, PERRLA, conjunctivae and secretions clear            Ears- Hearing, canals-normal            Nose- Clear,  no-Septal dev, mucus, polyps, erosion, perforation             Throat- Mallampati II , mucosa clear , drainage- none, tonsils- atrophic, + teeth Neck- flexible , trachea midline, no stridor , thyroid nl, carotid no bruit Chest - symmetrical excursion , unlabored           Heart/CV- RRR , no murmur , no gallop  , no rub, nl s1 s2                           - JVD- none , edema- none, stasis changes- none, varices- none           Lung- clear to P&A, wheeze- none, cough- none , dullness-none, rub- none           Chest wall-  Abd-  Br/ Gen/ Rectal- Not done, not indicated Extrem- cyanosis- none, clubbing, none, atrophy- none, strength- nl Neuro- grossly intact to observation

## 2021-03-11 ENCOUNTER — Other Ambulatory Visit: Payer: Self-pay

## 2021-03-11 ENCOUNTER — Encounter: Payer: Self-pay | Admitting: Internal Medicine

## 2021-03-11 ENCOUNTER — Ambulatory Visit (INDEPENDENT_AMBULATORY_CARE_PROVIDER_SITE_OTHER): Payer: 59 | Admitting: Internal Medicine

## 2021-03-11 VITALS — BP 110/66 | HR 82 | Temp 97.3°F | Ht 66.0 in | Wt 219.4 lb

## 2021-03-11 DIAGNOSIS — J9601 Acute respiratory failure with hypoxia: Secondary | ICD-10-CM | POA: Diagnosis not present

## 2021-03-11 DIAGNOSIS — R0683 Snoring: Secondary | ICD-10-CM

## 2021-03-11 DIAGNOSIS — G4734 Idiopathic sleep related nonobstructive alveolar hypoventilation: Secondary | ICD-10-CM

## 2021-03-11 NOTE — Patient Instructions (Signed)
Order- schedule overnight oximetry   Dx nocturnal hypoxemia  Encourage sleeping off your back, keeping your weight down, and getting some aerobic exercise  Please call if we can help

## 2021-04-15 ENCOUNTER — Other Ambulatory Visit: Payer: Self-pay

## 2021-04-16 ENCOUNTER — Ambulatory Visit (INDEPENDENT_AMBULATORY_CARE_PROVIDER_SITE_OTHER): Payer: 59 | Admitting: Family Medicine

## 2021-04-16 ENCOUNTER — Encounter: Payer: Self-pay | Admitting: Family Medicine

## 2021-04-16 VITALS — BP 110/88 | HR 80 | Temp 98.8°F | Ht 66.0 in | Wt 225.0 lb

## 2021-04-16 DIAGNOSIS — E039 Hypothyroidism, unspecified: Secondary | ICD-10-CM | POA: Diagnosis not present

## 2021-04-16 DIAGNOSIS — M2550 Pain in unspecified joint: Secondary | ICD-10-CM | POA: Diagnosis not present

## 2021-04-16 DIAGNOSIS — E781 Pure hyperglyceridemia: Secondary | ICD-10-CM

## 2021-04-16 DIAGNOSIS — I10 Essential (primary) hypertension: Secondary | ICD-10-CM

## 2021-04-16 DIAGNOSIS — E119 Type 2 diabetes mellitus without complications: Secondary | ICD-10-CM | POA: Diagnosis not present

## 2021-04-16 MED ORDER — METFORMIN HCL ER 500 MG PO TB24: 2000.0000 mg | ORAL_TABLET | Freq: Every day | ORAL | 3 refills | Status: AC

## 2021-04-16 MED ORDER — OLMESARTAN MEDOXOMIL 20 MG PO TABS: 20.0000 mg | ORAL_TABLET | Freq: Every day | ORAL | 3 refills | Status: DC

## 2021-04-16 NOTE — Progress Notes (Signed)
Chief Complaint  Patient presents with  . Follow-up    Medication refill    HPI: *Whitney Shelton is a 55 y.o. female here for DM, HLD, hypothyroidism and HTN follow-up. She is taking metformin 2000mg  daily and actos 30mg  daily.  For HTN she is taking olmesartan 20mg  daily For HLD, pt is taking lovaza 1gm, lipitor 10mg  daily, tricor 145mg  daily. For hypothyroidism pt is taking levothyroxine 102mcg daily  Pt does not check BS at home. Readings: n/a Hypoglycemia/Hypergylcemic episodes: no  Diet: sugar free yogurt and fruits, nuts, salads; cut down on pretzels significantly; not a good water drinker Exercise: walks on the beach, yardwork  Lab Results  Component Value Date   HGBA1C 9.2 (H) 11/21/2020   Lab Results  Component Value Date   MICROALBUR 8.8 (H) 11/21/2020   Lab Results  Component Value Date   CREATININE 0.91 11/21/2020   Lab Results  Component Value Date   CHOL 187 11/21/2020   HDL 37.50 (L) 11/21/2020   LDLDIRECT 81.0 11/21/2020   TRIG 489.0 (H) 11/21/2020   CHOLHDL 5 11/21/2020    The 10-year ASCVD risk score Mikey Bussing DC Jr., et al., 2013) is: 5.1%   Values used to calculate the score:     Age: 64 years     Sex: Female     Is Non-Hispanic African American: No     Diabetic: Yes     Tobacco smoker: No     Systolic Blood Pressure: [id number] mmHg     Is BP treated: Yes     HDL Cholesterol: 37.5 mg/dL     Total Cholesterol: 187 mg/dL  She complains of joint pains - fingers, wrists, elbows, hips. Not a new issue. Worse with minimal exercise/activity but also if she is seated for a prolonged period of time.  No meds on a regular basis.  Some occasional swelling when working outside.  No rash. No headaches. No dizziness.  + fatigue - does not sleep well.  Fam h/o - Paternal cousin with RA, mom with OA   Past Medical History:  Diagnosis Date  . Asthma    BRONCHIAL ASTHMA   . Complication of anesthesia HARD TO WAKE   slower to arouse   . Diabetes mellitus  without complication ([facility])    type 2  . Dyspnea    SINCE COVID 10/2019   . Family history of adverse reaction to anesthesia    mother slow to wake up  . Fibroids   . Frequency of urination   . History of kidney stones   . Migraine    HX OF MIGRAINES   . Pneumonia due to COVID-19 virus   . Renal calculus or stone LEFT SIDE M X1  NON-OBSTRUCTIVE  . SUI (stress urinary incontinence, female)   . Unspecified asthma(493.90) HX BRONCHIAL ASTHMA  --- LAST BOUT OF BRONCHITIS LAST WINTER  2012   usually with colds or heavy exercise    Past Surgical History:  Procedure Laterality Date  . CYSTOSCOPY N/A 02/17/2018   Procedure: CYSTOSCOPY;  Surgeon: Bjorn Loser, MD;  Location: [facility];  Service: Urology;  Laterality: N/A;  . CYSTOSCOPY W/ URETERAL STENT PLACEMENT  09/22/2012   Procedure: CYSTOSCOPY WITH RETROGRADE PYELOGRAM/URETERAL STENT PLACEMENT;  Surgeon: Reece Packer, MD;  Location: [facility];  Service: Urology;  Laterality: Left;  . CYSTOSCOPY W/ URETERAL STENT PLACEMENT Left 08/21/2020   Procedure: CYSTOSCOPY WITH RETROGRADE PYELOGRAM/URETERAL STENT PLACEMENT;  Surgeon: Bjorn Loser, MD;  Location: [facility];  Service:  Urology;  Laterality: Left;  . CYSTOSCOPY W/ URETERAL STENT REMOVAL  10/05/2012   Procedure: CYSTOSCOPY WITH STENT REMOVAL;  Surgeon: Alexis Frock, MD;  Location: [facility];  Service: Urology;;  . [place], URETEROSCOPY AND STENT PLACEMENT  10/26/2012   Procedure: CYSTOSCOPY WITH RETROGRADE PYELOGRAM, URETEROSCOPY AND STENT PLACEMENT;  Surgeon: Alexis Frock, MD;  Location: [facility];  Service: Urology;  Laterality: Left;  . CYSTOSCOPY WITH RETROGRADE PYELOGRAM, URETEROSCOPY AND STENT PLACEMENT Left 08/30/2020   Procedure: CYSTOSCOPY WITH RETROGRADE PYELOGRAM, URETEROSCOPY AND STENT REPLACEMENT;  Surgeon: Alexis Frock, MD;  Location: [facility];  Service: Urology;  Laterality: Left;  75 MINS  . CYSTOSCOPY  WITH STENT PLACEMENT Left 07/28/2017   Procedure: CYSTOSCOPY RETROGRADE WITH LEFT STENT PLACEMENT;  Surgeon: Franchot Gallo, MD;  Location: [facility];  Service: Urology;  Laterality: Left;  . CYSTOSCOPY/URETEROSCOPY/HOLMIUM LASER/STENT PLACEMENT Left 08/11/2017   Procedure: CYSTOSCOPY/RETROGRADE/URETEROSCOPY/HOLMIUM LASER/STENT EXCHANGE;  Surgeon: Alexis Frock, MD;  Location: [facility];  Service: Urology;  Laterality: Left;  . ENDOMETRIAL ABLATION W/ NOVASURE  04-25-2004  . eye lid lift Bilateral   . EYE SURGERY     lasix eye surgery  . HOLMIUM LASER APPLICATION Left 8/67/6720   Procedure: HOLMIUM LASER APPLICATION;  Surgeon: Alexis Frock, MD;  Location: [facility];  Service: Urology;  Laterality: Left;  . HYSTEROSCOPY WITH RESECTOSCOPE  01-04-2004   RESECTION FIBROID  . KIDNEY STONE SURGERY    . PUBOVAGINAL SLING N/A 02/17/2018   Procedure: Gaynelle Arabian;  Surgeon: Bjorn Loser, MD;  Location: [facility];  Service: Urology;  Laterality: N/A;  . ROBOTIC ASSISTED TOTAL HYSTERECTOMY Bilateral 02/17/2018   Procedure: XI ROBOTIC ASSISTED TOTAL HYSTERECTOMY WITH SALPINGECTOMY;  Surgeon: Brien Few, MD;  Location: [facility];  Service: Gynecology;  Laterality: Bilateral;  . URETEROSCOPY  10/05/2012   Procedure: URETEROSCOPY;  Surgeon: Alexis Frock, MD;  Location: [facility];  Service: Urology;  Laterality: Left;  . WISDOM TOOTH EXTRACTION  AGE 89   GEN. ANES.    Social History   Socioeconomic History  . Marital status: Married    Spouse name: Not on file  . Number of children: 2  . Years of education: 14  . Highest education level: Not on file  Occupational History  . Not on file  Tobacco Use  . Smoking status: Never Smoker  . Smokeless tobacco: Never Used  Vaping Use  . Vaping Use: Never used  Substance and Sexual Activity  . Alcohol use: Yes    Comment: rarely   . Drug use: No  . Sexual activity: Not Currently  Other Topics Concern  . Not on file  Social  History Narrative  . Not on file   Social Determinants of Health   Financial Resource Strain: Not on file  Food Insecurity: Not on file  Transportation Needs: Not on file  Physical Activity: Not on file  Stress: Not on file  Social Connections: Not on file  Intimate Partner Violence: Not on file    Family History  Problem Relation Age of Onset  . Hyperlipidemia Father   . Hypertension Father   . Diabetes Paternal Aunt      Immunization History  Administered Date(s) Administered  . Influenza Inj Mdck Quad With Preservative 10/21/2019  . Influenza Split 09/22/2012  . Influenza,inj,Quad PF,6+ Mos 11/26/2020  . Janssen (J&J) SARS-COV-2 Vaccination 03/03/2020  . PFIZER(Purple Top)SARS-COV-2 Vaccination 11/26/2020  . Pneumococcal Polysaccharide-23 09/22/2012    Outpatient Encounter Medications as of 04/16/2021  Medication Sig  . albuterol (VENTOLIN HFA) 108 (90 Base) MCG/ACT inhaler Inhale 1 puff into the lungs every 4 (four) hours as needed for wheezing or shortness of breath.  . allopurinol (ZYLOPRIM) 100 MG tablet Take 100 mg by mouth at bedtime.   . APPLE CIDER VINEGAR PO Take 1 tablet by mouth daily.  Marland Kitchen ascorbic acid (VITAMIN C) 1000 MG tablet Take 1,000 mg by mouth daily.  Marland Kitchen atorvastatin (LIPITOR) 10 MG tablet Take 1 tablet (10 mg total) by mouth at bedtime.  . Cholecalciferol 25 MCG (1000 UT) tablet Take 1,000 Units by mouth daily.  . fenofibrate (TRICOR) 145 MG tablet Take 1 tablet (145 mg total) by mouth daily.  Marland Kitchen levothyroxine (SYNTHROID) 25 MCG tablet TAKE ONE TABLET BY MOUTH DAILY BEFORE BREAKFAST  . magnesium gluconate (MAGONATE) 500 MG tablet Take 500 mg by mouth daily.  Marland Kitchen MILK THISTLE PO Take 1 tablet by mouth daily.  Marland Kitchen omega-3 acid ethyl esters (LOVAZA) 1 g capsule Take 1 g by mouth daily.   . pioglitazone (ACTOS) 30 MG tablet Take 1 tablet (30 mg total) by mouth daily.  . Zinc Citrate-Phytase (ZYTAZE) 25-500 MG CAPS Take 1 tablet by mouth daily.  .  [DISCONTINUED] metFORMIN (GLUCOPHAGE-XR) 500 MG 24 hr tablet Take 4 tablets (2,000 mg total) by mouth daily with breakfast.  . [DISCONTINUED] olmesartan (BENICAR) 20 MG tablet Take 1 tablet (20 mg total) by mouth daily.  Marland Kitchen azithromycin (ZITHROMAX Z-PAK) 250 MG tablet Take 1 tablet (250 mg total) by mouth daily. Take 2tabs on first day, then 1tab once a day till complete  . Blood Glucose Monitoring Suppl DEVI Use to check blood sugar as directed.  . Continuous Blood Gluc Receiver (FREESTYLE LIBRE 14 DAY READER) DEVI Use to check blood glucose readings. E.11  . Continuous Blood Gluc Sensor (FREESTYLE LIBRE 14 DAY SENSOR) MISC USE/WEAR TO MONITOR BLOOD SUGAR  . ELDERBERRY PO Take 1 mg by mouth daily. (Patient not taking: Reported on 04/16/2021)  . metFORMIN (GLUCOPHAGE-XR) 500 MG 24 hr tablet Take 4 tablets (2,000 mg total) by mouth daily with breakfast.  . olmesartan (BENICAR) 20 MG tablet Take 1 tablet (20 mg total) by mouth daily.   No facility-administered encounter medications on file as of 04/16/2021.     ROS: Pertinent positives and negatives noted in HPI. Remainder of ROS non-contributory    Allergies  Allergen Reactions  . Diazepam Nausea And Vomiting    Other reaction(s): Dizziness COLD SWEATS COLD SWEATS   . Hydrocodone Nausea And Vomiting    Other reaction(s): Dizziness COLD SWEATS COLD SWEATS   . Vicodin [Hydrocodone-Acetaminophen] Nausea Only  . Percocet [Oxycodone-Acetaminophen] Nausea Only    BP 110/88 (BP Location: Left Arm, Patient Position: Sitting, Cuff Size: Normal)   Pulse 80   Temp 98.8 F (37.1 C) (Oral)   Ht 5\' 6"  (1.676 m)   Wt 225 lb (102.1 kg)   LMP  (LMP Unknown)   SpO2 97%   BMI 36.32 kg/m   Physical Exam Constitutional:      General: She is not in acute distress.    Appearance: She is obese. She is not ill-appearing.  Cardiovascular:     Rate and Rhythm: Normal rate and regular rhythm.     Pulses: Normal pulses.  Pulmonary:     Effort:  Pulmonary effort is normal.     Breath sounds: Normal breath sounds. No wheezing or rhonchi.  Musculoskeletal:     Right lower leg: No edema.  Left lower leg: No edema.  Skin:    General: Skin is warm.  Neurological:     Mental Status: She is alert and oriented to person, place, and time.  Psychiatric:        Mood and Affect: Mood normal.        Behavior: Behavior normal.      A/P:  1. Type 2 diabetes mellitus without complication, without long-term current use of insulin (HCC) - uncontrolled - not checking BS at home Refill: - metFORMIN (GLUCOPHAGE-XR) 500 MG 24 hr tablet; Take 4 tablets (2,000 mg total) by mouth daily with breakfast.  Dispense: 360 tablet; Refill: 3 Cont: - actos 30mg  daily - will increase to 45mg  daily if A1C not at goal - cont statin, ARB - needs regular CV exercise routine, cont with dietary modification  2. Primary hypertension - controlled, at goal Refill: - olmesartan (BENICAR) 20 MG tablet; Take 1 tablet (20 mg total) by mouth daily.  Dispense: 90 tablet; Refill: 3  3. Hypothyroidism, unspecified type - TFTs today - currently on levothyroxine 72mcg daily  4. Hypertriglyceridemia - on fenofibrate 145mg  daily, lovaza 1gm daily, lipitor 10mg  daily  5. Arthralgia, unspecified joint - ANA - Rheumatoid factor - Sedimentation rate - C-reactive protein   This visit occurred during the SARS-CoV-2 public health emergency.  Safety protocols were in place, including screening questions prior to the visit, additional usage of staff PPE, and extensive cleaning of exam room while observing appropriate contact time as indicated for disinfecting solutions.

## 2021-04-17 LAB — C-REACTIVE PROTEIN: CRP: <1 mg/dL (ref 0.5–20.0)

## 2021-04-17 LAB — SEDIMENTATION RATE: Sed Rate: 5 mm/hr (ref 0–30)

## 2021-04-18 LAB — RHEUMATOID FACTOR: Rheumatoid fact SerPl-aCnc: <14 IU/mL (ref <14)

## 2021-04-18 LAB — ANA: Anti Nuclear Antibody (ANA): NEGATIVE

## 2021-05-29 ENCOUNTER — Encounter: Payer: Self-pay | Admitting: Family Medicine

## 2021-05-29 ENCOUNTER — Other Ambulatory Visit: Payer: Self-pay

## 2021-05-29 DIAGNOSIS — E039 Hypothyroidism, unspecified: Secondary | ICD-10-CM

## 2021-05-29 MED ORDER — LEVOTHYROXINE SODIUM 25 MCG PO TABS: ORAL_TABLET | ORAL | 1 refills | Status: DC

## 2021-07-08 ENCOUNTER — Encounter: Payer: Self-pay | Admitting: Internal Medicine

## 2021-07-08 NOTE — Assessment & Plan Note (Signed)
There may be mild residual nocturnal hypoxemia. Plan- Overnight oximetry

## 2021-07-08 NOTE — Assessment & Plan Note (Signed)
Very minimal OSA. Manage conservatively with weight loss, sleep off back.

## 2021-10-23 ENCOUNTER — Other Ambulatory Visit: Payer: Self-pay

## 2021-10-23 DIAGNOSIS — E119 Type 2 diabetes mellitus without complications: Secondary | ICD-10-CM

## 2021-10-23 MED ORDER — ATORVASTATIN CALCIUM 10 MG PO TABS: 10.0000 mg | ORAL_TABLET | Freq: Every day | ORAL | 0 refills | Status: DC

## 2021-10-23 NOTE — Telephone Encounter (Signed)
Refill request for: Atorvastatin 10 mg LR 10/17/20, #90, 3 rf LOV 04/16/21 FOV  none scheduled.  Please review and advise.  Thanks. Dm/cma

## 2021-11-04 ENCOUNTER — Other Ambulatory Visit: Payer: Self-pay

## 2021-11-04 DIAGNOSIS — E119 Type 2 diabetes mellitus without complications: Secondary | ICD-10-CM

## 2021-11-04 NOTE — Telephone Encounter (Signed)
Refill request for: Pioglitazone HCL 30 mg  LR 12/04/20, #90, 3 rf LOV 04/13/21 FOV  none scheduled.     Please review and advise.  Thanks. Dm/cma

## 2021-12-05 ENCOUNTER — Other Ambulatory Visit: Payer: Self-pay

## 2021-12-05 ENCOUNTER — Telehealth: Payer: Self-pay

## 2021-12-05 DIAGNOSIS — E039 Hypothyroidism, unspecified: Secondary | ICD-10-CM

## 2021-12-05 DIAGNOSIS — E119 Type 2 diabetes mellitus without complications: Secondary | ICD-10-CM

## 2021-12-05 MED ORDER — LEVOTHYROXINE SODIUM 25 MCG PO TABS
ORAL_TABLET | ORAL | 0 refills | Status: AC
Start: 2021-12-05 — End: ?

## 2021-12-05 MED ORDER — LEVOTHYROXINE SODIUM 25 MCG PO TABS: ORAL_TABLET | ORAL | 0 refills | Status: DC

## 2021-12-05 MED ORDER — PIOGLITAZONE HCL 30 MG PO TABS: 30.0000 mg | ORAL_TABLET | Freq: Every day | ORAL | 0 refills | Status: DC

## 2021-12-05 NOTE — Addendum Note (Signed)
Addended by: Armandina Gemma L on: 12/05/2021 11:50 AM   Modules accepted: Orders

## 2021-12-05 NOTE — Telephone Encounter (Signed)
Rx were sent to wrong pharmacy.  Resent it to the [facility].  Patient notified [place] phone.  Dm/cma

## 2021-12-05 NOTE — Telephone Encounter (Signed)
Refill request for:  Levothyroxine 25 mcg LR 05/29/21, #90, 1 rf  Pioglitazone 30 mg LR 12/04/20, #90, 3 rf LOV 04/16/21 FOV 02/09/22   [facility] [place], [place]  Please review and advise.  Thanks.  Dm/cma

## 2021-12-05 NOTE — Telephone Encounter (Signed)
Message sent on other. Dm/cma

## 2022-01-21 ENCOUNTER — Other Ambulatory Visit: Payer: Self-pay | Admitting: Family

## 2022-01-21 DIAGNOSIS — E119 Type 2 diabetes mellitus without complications: Secondary | ICD-10-CM

## 2022-01-22 ENCOUNTER — Other Ambulatory Visit: Payer: Self-pay | Admitting: Family

## 2022-01-22 ENCOUNTER — Other Ambulatory Visit: Payer: Self-pay | Admitting: Family Medicine

## 2022-01-22 DIAGNOSIS — E781 Pure hyperglyceridemia: Secondary | ICD-10-CM

## 2022-01-22 DIAGNOSIS — I10 Essential (primary) hypertension: Secondary | ICD-10-CM

## 2022-01-22 DIAGNOSIS — E119 Type 2 diabetes mellitus without complications: Secondary | ICD-10-CM

## 2022-01-22 MED ORDER — FENOFIBRATE 145 MG PO TABS: 145.0000 mg | ORAL_TABLET | Freq: Every day | ORAL | 0 refills | Status: AC

## 2022-01-22 MED ORDER — OLMESARTAN MEDOXOMIL 20 MG PO TABS: 20.0000 mg | ORAL_TABLET | Freq: Every day | ORAL | 0 refills | Status: AC

## 2022-01-22 NOTE — Telephone Encounter (Signed)
Refill request for: Fenofibrate 145 mg LR 11/24/20, #90, 3 rf  Olmesartan 20 mg LR 04/13/21, #90,3 rf  LOV 04/16/21 (Dr Bryan Lemma) FOV  02/09/22 Dr Gena Fray   Please review and advise.  Thanks Dm/cma

## 2022-01-22 NOTE — Telephone Encounter (Signed)
What is the name of the medication? fenofibrate (TRICOR) 145 MG tablet [[id number]]  and olmesartan (BENICAR) 20 MG tablet [[id number]]   Have you contacted your pharmacy to request a refill? Pt is requesting a refill on these scripts, she is a former pt of Dr. Loletha Grayer and has a TOC with Dr. Gena Fray on 02/09/22.  Which pharmacy would you like this sent to? [id number] [address], [place] - [address]  [address], [place] [address]  Phone:  [phone]  Fax:  [phone]    Patient notified that their request is being sent to the clinical staff for review and that they should receive a call once it is complete. If they do not receive a call within 72 hours they can check with their pharmacy or our office.

## 2022-01-23 NOTE — Telephone Encounter (Signed)
Lft detailed VM that Rx was sent to the pharmacy. Dm/cma

## 2022-02-09 ENCOUNTER — Encounter: Payer: 59 | Admitting: Family Medicine

## 2022-03-04 ENCOUNTER — Other Ambulatory Visit: Payer: Self-pay | Admitting: Family

## 2022-03-04 DIAGNOSIS — E039 Hypothyroidism, unspecified: Secondary | ICD-10-CM

## 2022-04-21 ENCOUNTER — Other Ambulatory Visit: Payer: Self-pay | Admitting: Family

## 2022-04-21 DIAGNOSIS — I10 Essential (primary) hypertension: Secondary | ICD-10-CM

## 2022-04-21 DIAGNOSIS — E781 Pure hyperglyceridemia: Secondary | ICD-10-CM

## 2022-04-21 DIAGNOSIS — E119 Type 2 diabetes mellitus without complications: Secondary | ICD-10-CM

## 2022-07-27 ENCOUNTER — Other Ambulatory Visit: Payer: Self-pay | Admitting: Family

## 2022-07-27 DIAGNOSIS — E119 Type 2 diabetes mellitus without complications: Secondary | ICD-10-CM

## 2022-07-27 DIAGNOSIS — E781 Pure hyperglyceridemia: Secondary | ICD-10-CM

## 2022-08-04 ENCOUNTER — Other Ambulatory Visit: Payer: Self-pay | Admitting: Family

## 2022-08-04 DIAGNOSIS — I10 Essential (primary) hypertension: Secondary | ICD-10-CM
